# Patient Record
Sex: Female | Born: 1960
Health system: Southern US, Community
[De-identification: ages and names within clinical notes are randomized; demographics above are authoritative.]

## PROBLEM LIST (undated history)

## (undated) DIAGNOSIS — T7840XA Allergy, unspecified, initial encounter: Secondary | ICD-10-CM

## (undated) DIAGNOSIS — D649 Anemia, unspecified: Secondary | ICD-10-CM

## (undated) DIAGNOSIS — F329 Major depressive disorder, single episode, unspecified: Secondary | ICD-10-CM

## (undated) DIAGNOSIS — G4730 Sleep apnea, unspecified: Secondary | ICD-10-CM

## (undated) DIAGNOSIS — G243 Spasmodic torticollis: Secondary | ICD-10-CM

## (undated) DIAGNOSIS — J45909 Unspecified asthma, uncomplicated: Secondary | ICD-10-CM

## (undated) DIAGNOSIS — F32A Depression, unspecified: Secondary | ICD-10-CM

## (undated) DIAGNOSIS — J479 Bronchiectasis, uncomplicated: Secondary | ICD-10-CM

## (undated) HISTORY — PX: EYE SURGERY: SHX253

## (undated) HISTORY — PX: GASTRIC BYPASS: SHX52

## (undated) HISTORY — PX: BREAST CYST ASPIRATION: SHX578

## (undated) HISTORY — DX: Unspecified asthma, uncomplicated: J45.909

## (undated) HISTORY — DX: Bronchiectasis, uncomplicated: J47.9

## (undated) HISTORY — DX: Allergy, unspecified, initial encounter: T78.40XA

## (undated) HISTORY — PX: OTHER SURGICAL HISTORY: SHX169

## (undated) HISTORY — DX: Sleep apnea, unspecified: G47.30

## (undated) HISTORY — PX: LAPAROSCOPIC OOPHERECTOMY: SHX6507

## (undated) HISTORY — DX: Spasmodic torticollis: G24.3

## (undated) HISTORY — DX: Anemia, unspecified: D64.9

---
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
## 2010-10-09 DIAGNOSIS — G259 Extrapyramidal and movement disorder, unspecified: Secondary | ICD-10-CM | POA: Insufficient documentation

## 2011-08-20 DIAGNOSIS — J45909 Unspecified asthma, uncomplicated: Secondary | ICD-10-CM | POA: Insufficient documentation

## 2012-06-30 DIAGNOSIS — F3342 Major depressive disorder, recurrent, in full remission: Secondary | ICD-10-CM | POA: Insufficient documentation

## 2012-06-30 DIAGNOSIS — Z9884 Bariatric surgery status: Secondary | ICD-10-CM | POA: Insufficient documentation

## 2013-10-13 DIAGNOSIS — R911 Solitary pulmonary nodule: Secondary | ICD-10-CM | POA: Insufficient documentation

## 2015-12-26 DIAGNOSIS — R918 Other nonspecific abnormal finding of lung field: Secondary | ICD-10-CM | POA: Insufficient documentation

## 2018-03-12 DIAGNOSIS — H04123 Dry eye syndrome of bilateral lacrimal glands: Secondary | ICD-10-CM | POA: Insufficient documentation

## 2019-10-27 DIAGNOSIS — U071 COVID-19: Secondary | ICD-10-CM | POA: Insufficient documentation

## 2020-05-31 ENCOUNTER — Encounter: Payer: Self-pay | Admitting: Otolaryngology

## 2020-06-05 ENCOUNTER — Other Ambulatory Visit: Payer: Self-pay

## 2020-06-05 ENCOUNTER — Other Ambulatory Visit
Admission: RE | Admit: 2020-06-05 | Discharge: 2020-06-05 | Disposition: A | Discharge status: Home / Self Care | Payer: BC Managed Care – PPO | Source: Ambulatory Visit | Attending: Otolaryngology | Admitting: Otolaryngology

## 2020-06-05 DIAGNOSIS — Z20822 Contact with and (suspected) exposure to covid-19: Secondary | ICD-10-CM | POA: Diagnosis not present

## 2020-06-05 DIAGNOSIS — Z01812 Encounter for preprocedural laboratory examination: Secondary | ICD-10-CM | POA: Insufficient documentation

## 2020-06-05 LAB — SARS CORONAVIRUS 2 (TAT 6-24 HRS): SARS Coronavirus 2: NEGATIVE

## 2020-06-07 ENCOUNTER — Ambulatory Visit
Admission: RE | Admit: 2020-06-07 | Discharge: 2020-06-07 | Disposition: A | Discharge status: Home / Self Care | Payer: BC Managed Care – PPO | Attending: Otolaryngology | Admitting: Otolaryngology

## 2020-06-07 ENCOUNTER — Encounter
Admission: RE | Disposition: A | Discharge status: Home / Self Care | Payer: Self-pay | Source: Home / Self Care | Attending: Otolaryngology

## 2020-06-07 ENCOUNTER — Ambulatory Visit: Payer: BC Managed Care – PPO | Admitting: Anesthesiology

## 2020-06-07 ENCOUNTER — Encounter: Payer: Self-pay | Admitting: Otolaryngology

## 2020-06-07 ENCOUNTER — Other Ambulatory Visit: Payer: Self-pay

## 2020-06-07 DIAGNOSIS — Z79899 Other long term (current) drug therapy: Secondary | ICD-10-CM | POA: Insufficient documentation

## 2020-06-07 DIAGNOSIS — J343 Hypertrophy of nasal turbinates: Secondary | ICD-10-CM | POA: Insufficient documentation

## 2020-06-07 DIAGNOSIS — Z7951 Long term (current) use of inhaled steroids: Secondary | ICD-10-CM | POA: Diagnosis not present

## 2020-06-07 DIAGNOSIS — J342 Deviated nasal septum: Secondary | ICD-10-CM | POA: Insufficient documentation

## 2020-06-07 DIAGNOSIS — Z880 Allergy status to penicillin: Secondary | ICD-10-CM | POA: Insufficient documentation

## 2020-06-07 HISTORY — DX: Depression, unspecified: F32.A

## 2020-06-07 HISTORY — PX: NASAL SEPTOPLASTY W/ TURBINOPLASTY: SHX2070

## 2020-06-07 HISTORY — PX: ENDOSCOPIC CONCHA BULLOSA RESECTION: SHX6395

## 2020-06-07 SURGERY — SEPTOPLASTY, NOSE, WITH NASAL TURBINATE REDUCTION
Anesthesia: General | Site: Nose | Laterality: Right

## 2020-06-07 MED ORDER — ONDANSETRON HCL 4 MG/2ML IJ SOLN
INTRAMUSCULAR | Status: DC | PRN
  Administered 2020-06-07: 4 mg via INTRAVENOUS

## 2020-06-07 MED ORDER — PHENYLEPHRINE HCL (PRESSORS) 10 MG/ML IV SOLN
INTRAVENOUS | Status: DC | PRN
Start: 2020-06-07 — End: 2020-06-07
  Administered 2020-06-07 (×2): 100 ug via INTRAVENOUS

## 2020-06-07 MED ORDER — OXYCODONE HCL 5 MG/5ML PO SOLN: 5.0000 mg | Freq: Once | ORAL | Status: AC | PRN

## 2020-06-07 MED ORDER — GLYCOPYRROLATE 0.2 MG/ML IJ SOLN
INTRAMUSCULAR | Status: DC | PRN
  Administered 2020-06-07: .1 mg via INTRAVENOUS

## 2020-06-07 MED ORDER — LIDOCAINE-EPINEPHRINE 1 %-1:100000 IJ SOLN
INTRAMUSCULAR | Status: DC | PRN
  Administered 2020-06-07: 1 mL
  Administered 2020-06-07: 6 mL

## 2020-06-07 MED ORDER — EPHEDRINE SULFATE 50 MG/ML IJ SOLN
INTRAMUSCULAR | Status: DC | PRN
Start: 2020-06-07 — End: 2020-06-07
  Administered 2020-06-07: 10 mg via INTRAVENOUS
  Administered 2020-06-07 (×2): 5 mg via INTRAVENOUS

## 2020-06-07 MED ORDER — SCOPOLAMINE 1 MG/3DAYS TD PT72
1.0000 | MEDICATED_PATCH | TRANSDERMAL | Status: DC
  Administered 2020-06-07: 1.5 mg via TRANSDERMAL

## 2020-06-07 MED ORDER — OXYMETAZOLINE HCL 0.05 % NA SOLN
2.0000 | Freq: Once | NASAL | Status: AC
  Administered 2020-06-07: 2 via NASAL

## 2020-06-07 MED ORDER — FENTANYL CITRATE (PF) 100 MCG/2ML IJ SOLN
25.0000 ug | INTRAMUSCULAR | Status: DC | PRN
  Administered 2020-06-07 (×2): 25 ug via INTRAVENOUS

## 2020-06-07 MED ORDER — MIDAZOLAM HCL 2 MG/2ML IJ SOLN
1.0000 mg | INTRAMUSCULAR | Status: AC | PRN
  Administered 2020-06-07 (×2): 1 mg via INTRAVENOUS

## 2020-06-07 MED ORDER — MIDAZOLAM HCL 5 MG/5ML IJ SOLN
INTRAMUSCULAR | Status: DC | PRN
  Administered 2020-06-07: 2 mg via INTRAVENOUS

## 2020-06-07 MED ORDER — PROPOFOL 10 MG/ML IV BOLUS
INTRAVENOUS | Status: DC | PRN
  Administered 2020-06-07: 100 mg via INTRAVENOUS

## 2020-06-07 MED ORDER — ONDANSETRON HCL 4 MG/2ML IJ SOLN: 4.0000 mg | Freq: Once | INTRAMUSCULAR | Status: DC | PRN

## 2020-06-07 MED ORDER — SODIUM CHLORIDE 0.9 % IV SOLN
600.0000 mg | Freq: Once | INTRAVENOUS | Status: AC
  Administered 2020-06-07: 600 mg via INTRAVENOUS

## 2020-06-07 MED ORDER — DEXAMETHASONE SODIUM PHOSPHATE 4 MG/ML IJ SOLN
INTRAMUSCULAR | Status: DC | PRN
  Administered 2020-06-07: 10 mg via INTRAVENOUS

## 2020-06-07 MED ORDER — KETOROLAC TROMETHAMINE 30 MG/ML IJ SOLN: 15.0000 mg | Freq: Once | INTRAMUSCULAR | Status: DC | PRN

## 2020-06-07 MED ORDER — PHENYLEPHRINE HCL 0.5 % NA SOLN
NASAL | Status: DC | PRN
  Administered 2020-06-07: 30 mL via TOPICAL

## 2020-06-07 MED ORDER — OXYCODONE HCL 5 MG PO TABS
5.0000 mg | ORAL_TABLET | Freq: Once | ORAL | Status: AC | PRN
  Administered 2020-06-07: 5 mg via ORAL

## 2020-06-07 MED ORDER — LIDOCAINE HCL (CARDIAC) PF 100 MG/5ML IV SOSY
PREFILLED_SYRINGE | INTRAVENOUS | Status: DC | PRN
  Administered 2020-06-07: 50 mg via INTRAVENOUS

## 2020-06-07 MED ORDER — SUCCINYLCHOLINE CHLORIDE 20 MG/ML IJ SOLN
INTRAMUSCULAR | Status: DC | PRN
  Administered 2020-06-07: 100 mg via INTRAVENOUS

## 2020-06-07 MED ORDER — FENTANYL CITRATE (PF) 100 MCG/2ML IJ SOLN
INTRAMUSCULAR | Status: DC | PRN
  Administered 2020-06-07: 100 ug via INTRAVENOUS

## 2020-06-07 MED ORDER — LACTATED RINGERS IV SOLN: INTRAVENOUS | Status: DC

## 2020-06-07 SURGICAL SUPPLY — 33 items
CANISTER SUCT 1200ML W/VALVE (MISCELLANEOUS) ×3 IMPLANT
CATH IV 18X1 1/4 SAFELET (CATHETERS) ×3 IMPLANT
COAGULATOR SUCT 8FR VV (MISCELLANEOUS) ×3 IMPLANT
DRAPE HEAD BAR (DRAPES) ×3 IMPLANT
ELECT REM PT RETURN 9FT ADLT (ELECTROSURGICAL) ×3
ELECTRODE REM PT RTRN 9FT ADLT (ELECTROSURGICAL) ×2 IMPLANT
GEL ULTRASOUND 20GR AQUASONIC (MISCELLANEOUS) ×3 IMPLANT
GLOVE PI ULTRA LF STRL 7.5 (GLOVE) ×6 IMPLANT
GLOVE PI ULTRA NON LATEX 7.5 (GLOVE) ×3
GOWN STRL REUS W/ TWL LRG LVL3 (GOWN DISPOSABLE) ×2 IMPLANT
GOWN STRL REUS W/TWL LRG LVL3 (GOWN DISPOSABLE) ×3
IV CATH 18X1 1/4 SAFELET (CATHETERS) ×2
KIT TURNOVER KIT A (KITS) ×3 IMPLANT
NEEDLE ANESTHESIA  27G X 3.5 (NEEDLE) ×1
NEEDLE ANESTHESIA 27G X 3.5 (NEEDLE) ×2 IMPLANT
NEEDLE HYPO 27GX1-1/4 (NEEDLE) ×3 IMPLANT
PACK ENT CUSTOM (PACKS) ×3 IMPLANT
PACKING NASAL EPIS 4X2.4 XEROG (MISCELLANEOUS) ×3 IMPLANT
PATTIES SURGICAL .5 X3 (DISPOSABLE) ×3 IMPLANT
SOL ANTI-FOG 6CC FOG-OUT (MISCELLANEOUS) ×2 IMPLANT
SOL FOG-OUT ANTI-FOG 6CC (MISCELLANEOUS) ×1
SPLINT NASAL SEPTAL BLV .50 ST (MISCELLANEOUS) ×3 IMPLANT
STRAP BODY AND KNEE 60X3 (MISCELLANEOUS) ×3 IMPLANT
STYLUS VIVAER (MISCELLANEOUS) ×3
STYLUS VIVAER BP ELECT (MISCELLANEOUS) ×2 IMPLANT
SUT CHROMIC 3-0 (SUTURE) ×6
SUT CHROMIC 3-0 KS 27XMFL CR (SUTURE) ×4
SUT ETHILON 3-0 KS 30 BLK (SUTURE) ×3 IMPLANT
SUT PLAIN GUT 4-0 (SUTURE) ×6 IMPLANT
SUTURE CHRMC 3-0 KS 27XMFL CR (SUTURE) ×4 IMPLANT
SYR 3ML LL SCALE MARK (SYRINGE) ×3 IMPLANT
TOWEL OR 17X26 4PK STRL BLUE (TOWEL DISPOSABLE) ×3 IMPLANT
WATER STERILE IRR 250ML POUR (IV SOLUTION) ×3 IMPLANT

## 2020-06-07 NOTE — Progress Notes (Signed)
Pt complaining of anxiety and "difficulty breathing." VSS, lung sounds clear. Pt requesting prescription for anxiety meds at home, Dr. Elenore Rota informed and scripts being sent electronically to pharmacy. Dr. Jena Gauss, anesthesia, informed of pt's complaint and order received for versed. Will admin as ordered and continue to monitor.

## 2020-06-07 NOTE — H&P (Signed)
H&P has been reviewed and patient reevaluated, no changes necessary. To be downloaded later.  

## 2020-06-07 NOTE — Anesthesia Procedure Notes (Signed)
Procedure Name: Intubation Date/Time: 06/07/2020 11:36 AM Performed by: Jimmy Picket, CRNA Pre-anesthesia Checklist: Patient identified, Emergency Drugs available, Suction available, Patient being monitored and Timeout performed Patient Re-evaluated:Patient Re-evaluated prior to induction Oxygen Delivery Method: Circle system utilized Preoxygenation: Pre-oxygenation with 100% oxygen Induction Type: IV induction Ventilation: Mask ventilation without difficulty Laryngoscope Size: Miller and 2 Grade View: Grade I Tube type: Oral Rae Tube size: 7.0 mm Number of attempts: 1 Placement Confirmation: ETT inserted through vocal cords under direct vision,  positive ETCO2 and breath sounds checked- equal and bilateral Tube secured with: Tape Dental Injury: Teeth and Oropharynx as per pre-operative assessment

## 2020-06-07 NOTE — Transfer of Care (Signed)
Immediate Anesthesia Transfer of Care Note  Patient: Dalayza Zambrana  Procedure(s) Performed: NASAL SEPTOPLASTY WITH INFERIOR TURBINATE REDUCTION (Bilateral Nose) ENDOSCOPIC CONCHA BULLOSA RESECTION (Right Nose) NASAL VALVE REPAIR WITH NASAL IMPLANT (Bilateral Nose)  Patient Location: PACU  Anesthesia Type: General  Level of Consciousness: awake, alert  and patient cooperative  Airway and Oxygen Therapy: Patient Spontanous Breathing and Patient connected to supplemental oxygen  Post-op Assessment: Post-op Vital signs reviewed, Patient's Cardiovascular Status Stable, Respiratory Function Stable, Patent Airway and No signs of Nausea or vomiting  Post-op Vital Signs: Reviewed and stable  Complications: No complications documented.

## 2020-06-07 NOTE — Anesthesia Postprocedure Evaluation (Signed)
Anesthesia Post Note  Patient: Jennifer Hubbard  Procedure(s) Performed: NASAL SEPTOPLASTY WITH INFERIOR TURBINATE REDUCTION (Bilateral Nose) ENDOSCOPIC CONCHA BULLOSA RESECTION (Right Nose) NASAL VALVE REPAIR WITH NASAL IMPLANT (Bilateral Nose)     Patient location during evaluation: PACU Anesthesia Type: General Level of consciousness: awake and alert and oriented Pain management: pain level controlled Vital Signs Assessment: post-procedure vital signs reviewed and stable Respiratory status: spontaneous breathing, nonlabored ventilation and respiratory function stable Cardiovascular status: stable and blood pressure returned to baseline Postop Assessment: no headache and no backache Anesthetic complications: no   No complications documented.  Edwyna Ready

## 2020-06-07 NOTE — Discharge Instructions (Signed)
[facility] ENDOSCOPIC SINUS SURGERY [facility]  What is Functional Endoscopic Sinus Surgery?  The Surgery involves making the natural openings of the sinuses larger by removing the bony partitions that separate the sinuses from the nasal cavity.  The natural sinus lining is preserved as much as possible to allow the sinuses to resume normal function after the surgery.  In some patients nasal polyps (excessively swollen lining of the sinuses) may be removed to relieve obstruction of the sinus openings.  The surgery is performed through the nose using lighted scopes, which eliminates the need for incisions on the face.  A septoplasty is a different procedure which is sometimes performed with sinus surgery.  It involves straightening the boy partition that separates the two sides of your nose.  A crooked or deviated septum may need repair if is obstructing the sinuses or nasal airflow.  Turbinate reduction is also often performed during sinus surgery.  The turbinates are bony proturberances from the side walls of the nose which swell and can obstruct the nose in patients with sinus and allergy problems.  Their size can be surgically reduced to help relieve nasal obstruction.  What Can Sinus Surgery Do For Me?  Sinus surgery can reduce the frequency of sinus infections requiring antibiotic treatment.  This can provide improvement in nasal congestion, post-nasal drainage, facial pressure and nasal obstruction.  Surgery will NOT prevent you from ever having an infection again, so it usually only for patients who get infections 4 or more times yearly requiring antibiotics, or for infections that do not clear with antibiotics.  It will not cure nasal allergies, so patients with allergies may still require medication to treat their allergies after surgery. Surgery may improve headaches related to sinusitis, however, some people will continue to  require medication to control sinus headaches related to allergies.  Surgery will do nothing for other forms of headache (migraine, tension or cluster).  What Are the Risks of Endoscopic Sinus Surgery?  Current techniques allow surgery to be performed safely with little risk, however, there are rare complications that patients should be aware of.  Because the sinuses are located around the eyes, there is risk of eye injury, including blindness, though again, this would be quite rare. This is usually a result of bleeding behind the eye during surgery, which puts the vision oat risk, though there are treatments to protect the vision and prevent permanent disrupted by surgery causing a leak of the spinal fluid that surrounds the brain.  More serious complications would include bleeding inside the brain cavity or damage to the brain.  Again, all of these complications are uncommon, and spinal fluid leaks can be safely managed surgically if they occur.  The most common complication of sinus surgery is bleeding from the nose, which may require packing or cauterization of the nose.  Continued sinus have polyps may experience recurrence of the polyps requiring revision surgery.  Alterations of sense of smell or injury to the tear ducts are also rare complications.   What is the Surgery Like, and what is the Recovery?  The Surgery usually takes a couple of hours to perform, and is usually performed under a general anesthetic (completely asleep).  Patients are usually discharged home after a couple of hours.  Sometimes during surgery it is necessary to pack the nose to control bleeding, and the packing is left in place for 24 - 48 hours, and removed by your surgeon.    If a septoplasty was performed during the procedure, there is often a splint placed which must be removed after 5-7 days.   Discomfort: Pain is usually mild to moderate, and can be controlled by prescription pain medication or acetaminophen (Tylenol).   Aspirin, Ibuprofen (Advil, Motrin), or Naprosyn (Aleve) should be avoided, as they can cause increased bleeding.  Most patients feel sinus pressure like they have a bad head cold for several days.  Sleeping with your head elevated can help reduce swelling and facial pressure, as can ice packs over the face.  A humidifier may be helpful to keep the mucous and blood from drying in the nose.   Diet: There are no specific diet restrictions, however, you should generally start with clear liquids and a light diet of bland foods because the anesthetic can cause some nausea.  Advance your diet depending on how your stomach feels.  Taking your pain medication with food will often help reduce stomach upset which pain medications can cause.  Nasal Saline Irrigation: It is important to remove blood clots and dried mucous from the nose as it is healing.  This is done by having you irrigate the nose at least 3 - 4 times daily with a salt water solution.  We recommend using NeilMed Sinus Rinse (available at the drug store).  Fill the squeeze bottle with the solution, bend over a sink, and insert the tip of the squeeze bottle into the nose  of an inch.  Point the tip of the squeeze bottle towards the inside corner of the eye on the same side your irrigating.  Squeeze the bottle and gently irrigate the nose.  If you bend forward as you do this, most of the fluid will flow back out of the nose, instead of down your throat.   The solution should be warm, near body temperature, when you irrigate.   Each time you irrigate, you should use a full squeeze bottle.   Note that if you are instructed to use Nasal Steroid Sprays at any time after your surgery, irrigate with saline BEFORE using the steroid spray, so you do not wash it all out of the nose. Another product, Nasal Saline Gel (such as AYR Nasal Saline Gel) can be applied in each nostril 3 - 4 times daily to moisture the nose and reduce scabbing or crusting.  Bleeding:   Bloody drainage from the nose can be expected for several days, and patients are instructed to irrigate their nose frequently with salt water to help remove mucous and blood clots.  The drainage may be dark red or brown, though some fresh blood may be seen intermittently, especially after irrigation.  Do not blow you nose, as bleeding may occur. If you must sneeze, keep your mouth open to allow air to escape through your mouth.  If heavy bleeding occurs: Irrigate the nose with saline to rinse out clots, then spray the nose 3 - 4 times with Afrin Nasal Decongestant Spray.  The spray will constrict the blood vessels to slow bleeding.  Pinch the lower half of your nose shut to apply pressure, and lay down with your head elevated.  Ice packs over the nose may help as well. If bleeding persists despite these measures, you should notify your doctor.  Do not use the Afrin routinely to control nasal congestion after surgery, as it can result in worsening congestion and may affect healing.   Activity: Return to work varies among patients. Most patients will be out of   work at least 5 - 7 days to recover.  Patient may return to work after they are off of narcotic pain medication, and feeling well enough to perform the functions of their job.  Patients must avoid heavy lifting (over 10 pounds) or strenuous physical for 2 weeks after surgery, so your employer may need to assign you to light duty, or keep you out of work longer if light duty is not possible.  NOTE: you should not drive, operate dangerous machinery, do any mentally demanding tasks or make any important legal or financial decisions while on narcotic pain medication and recovering from the general anesthetic.    Call Your Doctor Immediately if You Have Any of the Following: 1. Bleeding that you cannot control with the above measures 2. Loss of vision, double vision, bulging of the eye or black eyes. 3. Fever over 101 degrees 4. Neck stiffness with severe  headache, fever, nausea and change in mental state. You are always encourage to call anytime with concerns, however, please call with requests for pain medication refills during office hours.  Office Endoscopy: During follow-up visits your doctor will remove any packing or splints that may have been placed and evaluate and clean your sinuses endoscopically.  Topical anesthetic will be used to make this as comfortable as possible, though you may want to take your pain medication prior to the visit.  How often this will need to be done varies from patient to patient.  After complete recovery from the surgery, you may need follow-up endoscopy from time to time, particularly if there is concern of recurrent infection or nasal polyps.  General Anesthesia, Adult, Care After This sheet gives you information about how to care for yourself after your procedure. Your health care provider may also give you more specific instructions. If you have problems or questions, contact your health care provider. What can I expect after the procedure? After the procedure, the following side effects are common:  Pain or discomfort at the IV site.  Nausea.  Vomiting.  Sore throat.  Trouble concentrating.  Feeling cold or chills.  Weak or tired.  Sleepiness and fatigue.  Soreness and body aches. These side effects can affect parts of the body that were not involved in surgery. Follow these instructions at home:  For at least 24 hours after the procedure:  Have a responsible adult stay with you. It is important to have someone help care for you until you are awake and alert.  Rest as needed.  Do not: ? Participate in activities in which you could fall or become injured. ? Drive. ? Use heavy machinery. ? Drink alcohol. ? Take sleeping pills or medicines that cause drowsiness. ? Make important decisions or sign legal documents. ? Take care of children on your own. Eating and drinking  Follow any  instructions from your health care provider about eating or drinking restrictions.  When you feel hungry, start by eating small amounts of foods that are soft and easy to digest (bland), such as toast. Gradually return to your regular diet.  Drink enough fluid to keep your urine pale yellow.  If you vomit, rehydrate by drinking water, juice, or clear broth. General instructions  If you have sleep apnea, surgery and certain medicines can increase your risk for breathing problems. Follow instructions from your health care provider about wearing your sleep device: ? Anytime you are sleeping, including during daytime naps. ? While taking prescription pain medicines, sleeping medicines, or medicines that make you   drowsy.  Return to your normal activities as told by your health care provider. Ask your health care provider what activities are safe for you.  Take over-the-counter and prescription medicines only as told by your health care provider.  If you smoke, do not smoke without supervision.  Keep all follow-up visits as told by your health care provider. This is important. Contact a health care provider if:  You have nausea or vomiting that does not get better with medicine.  You cannot eat or drink without vomiting.  You have pain that does not get better with medicine.  You are unable to pass urine.  You develop a skin rash.  You have a fever.  You have redness around your IV site that gets worse. Get help right away if:  You have difficulty breathing.  You have chest pain.  You have blood in your urine or stool, or you vomit blood. Summary  After the procedure, it is common to have a sore throat or nausea. It is also common to feel tired.  Have a responsible adult stay with you for the first 24 hours after general anesthesia. It is important to have someone help care for you until you are awake and alert.  When you feel hungry, start by eating small amounts of foods  that are soft and easy to digest (bland), such as toast. Gradually return to your regular diet.  Drink enough fluid to keep your urine pale yellow.  Return to your normal activities as told by your health care provider. Ask your health care provider what activities are safe for you. This information is not intended to replace advice given to you by your health care provider. Make sure you discuss any questions you have with your health care provider. Document Revised: 10/16/2017 Document Reviewed: 05/29/2017 Elsevier Patient Education  2020 [employer]  Scopolamine skin patches Remove in 72 hrs. Wash hands immediately after removal. What is this medicine? SCOPOLAMINE (skoe POL a meen) is used to prevent nausea and vomiting caused by motion sickness, anesthesia and surgery. This medicine may be used for other purposes; ask your health care provider or pharmacist if you have questions. COMMON BRAND NAME(S): Transderm Scop What should I tell my health care provider before I take this medicine? They need to know if you have any of these conditions:  are scheduled to have a gastric secretion test  glaucoma  heart disease  kidney disease  liver disease  lung or breathing disease, like asthma  mental illness  prostate disease  seizures  stomach or intestine problems  trouble passing urine  an unusual or allergic reaction to scopolamine, atropine, other medicines, foods, dyes, or preservatives  pregnant or trying to get pregnant  breast-feeding How should I use this medicine? This medicine is for external use only. Follow the directions on the prescription label. Wear only 1 patch at a time. Choose an area behind the ear, that is clean, dry, hairless and free from any cuts or irritation. Wipe the area with a clean dry tissue. Peel off the plastic backing of the skin patch, trying not to touch the adhesive side with your hands. Do not cut the patches. Firmly apply to the area  you have chosen, with the metallic side of the patch to the skin and the tan-colored side showing. Once firmly in place, wash your hands well with soap and water. Do not get this medicine into your eyes. After removing the patch, wash your hands and the   area behind your ear thoroughly with soap and water. The patch will still contain some medicine after use. To avoid accidental contact or ingestion by children or pets, fold the used patch in half with the sticky side together and throw away in the trash out of the reach of children and pets. If you need to use a second patch after you remove the first, place it behind the other ear. A special MedGuide will be given to you by the pharmacist with each prescription and refill. Be sure to read this information carefully each time. Talk to your pediatrician regarding the use of this medicine in children. Special care may be needed. Overdosage: If you think you have taken too much of this medicine contact a poison control center or emergency room at once. NOTE: This medicine is only for you. Do not share this medicine with others. What if I miss a dose? This does not apply. This medicine is not for regular use. What may interact with this medicine?  alcohol  antihistamines for allergy cough and cold  atropine  certain medicines for anxiety or sleep  certain medicines for bladder problems like oxybutynin, tolterodine  certain medicines for depression like amitriptyline, fluoxetine, sertraline  certain medicines for stomach problems like dicyclomine, hyoscyamine  certain medicines for Parkinson's disease like benztropine, trihexyphenidyl  certain medicines for seizures like phenobarbital, primidone  general anesthetics like halothane, isoflurane, methoxyflurane, propofol  ipratropium  local anesthetics like lidocaine, pramoxine, tetracaine  medicines that relax muscles for surgery  phenothiazines like chlorpromazine, mesoridazine,  prochlorperazine, thioridazine  narcotic medicines for pain  other belladonna alkaloids This list may not describe all possible interactions. Give your health care provider a list of all the medicines, herbs, non-prescription drugs, or dietary supplements you use. Also tell them if you smoke, drink alcohol, or use illegal drugs. Some items may interact with your medicine. What should I watch for while using this medicine? Limit contact with water while swimming and bathing because the patch may fall off. If the patch falls off, throw it away and put a new one behind the other ear. You may get drowsy or dizzy. Do not drive, use machinery, or do anything that needs mental alertness until you know how this medicine affects you. Do not stand or sit up quickly, especially if you are an older patient. This reduces the risk of dizzy or fainting spells. Alcohol may interfere with the effect of this medicine. Avoid alcoholic drinks. Your mouth may get dry. Chewing sugarless gum or sucking hard candy, and drinking plenty of water may help. Contact your healthcare professional if the problem does not go away or is severe. This medicine may cause dry eyes and blurred vision. If you wear contact lenses, you may feel some discomfort. Lubricating drops may help. See your healthcare professional if the problem does not go away or is severe. If you are going to need surgery, an MRI, CT scan, or other procedure, tell your healthcare professional that you are using this medicine. You may need to remove the patch before the procedure. What side effects may I notice from receiving this medicine? Side effects that you should report to your doctor or health care professional as soon as possible:  allergic reactions like skin rash, itching or hives; swelling of the face, lips, or tongue  blurred vision  changes in vision  confusion  dizziness  eye pain  fast, irregular heartbeat  hallucinations, loss of contact  with reality  nausea, vomiting    pain or trouble passing urine  restlessness  seizures  skin irritation  stomach pain Side effects that usually do not require medical attention (report to your doctor or health care professional if they continue or are bothersome):  drowsiness  dry mouth  headache  sore throat This list may not describe all possible side effects. Call your doctor for medical advice about side effects. You may report side effects to FDA at 1-800-FDA-1088. Where should I keep my medicine? Keep out of the reach of children. Store at room temperature between 20 and 25 degrees C (68 and 77 degrees F). Keep this medicine in the foil package until ready to use. Throw away any unused medicine after the expiration date. NOTE: This sheet is a summary. It may not cover all possible information. If you have questions about this medicine, talk to your doctor, pharmacist, or health care provider.  2020 Elsevier/Gold Standard (2018-01-01 16:14:46)   

## 2020-06-07 NOTE — Op Note (Signed)
06/07/2020  12:48 PM  [id number]   Pre-Op Dx:  Deviated Nasal Septum, Hypertrophic Inferior Turbinates, concha bullosa right middle turbinate, nasal valve obstruction  Post-op Dx: Same  Proc: Nasal Septoplasty, Bilateral Partial Reduction Inferior Turbinates, endoscopic trimming right middle turbinate concha bullosa, bilateral destruction of nasal lesions(nasal valve repair using Vivaer)   Surg:  Beverly Sessions Broadus Costilla  Anes:  GOT  EBL:  50 ml  Comp:  None  Findings: Patient with fairly thin cartilage that is flexible and collapsible.  The septum deviates to the left side but has an inferior spur on the right.  There is a vomer spur on the left side posteriorly.  The nasal valves are narrow with lateral collapse of the valve wall.  The inferior turbinates are enlarged and she has a concha bullosa of the right middle turbinate   Procedure: With the patient in a comfortable supine position,  general orotracheal anesthesia was induced without difficulty.     The patient received preoperative Afrin spray for topical decongestion and vasoconstriction.  Intravenous prophylactic antibiotics were administered.  At an appropriate level, the patient was placed in a semi-sitting position.  Nasal vibrissae were trimmed.   1% Xylocaine with 1:100,000 epinephrine, 6 cc's, was infiltrated into the anterior floor of the nose, into the nasal spine region, into the membranous columella, and finally into the submucoperichondrial plane of the septum on both sides.  Several minutes were allowed for this to take effect.  Cottoniod pledgetts soaked in Afrin and 4% Xylocaine were placed into both nasal cavities and left while the patient was prepped and draped in the standard fashion.  The materials were removed from the nose and observed to be intact and correct in number.  The nose was inspected with a headlight and zero degree scope with the findings as described above.  A left Killian incision was sharply executed  and carried down to the quadrangular cartilage. The mucoperichondrium was elelvated along the quadrangular plate back to the bony-cartilaginous junction. The mucoperiostium was then elevated along the ethmoid plate and the vomer. The boney-catilaginous junction was then split with a freer elevator and the mucoperiosteum was elevated on the opposite side. The mucoperiosteum was then elevated along the maxillary crest as needed to expose the crooked bone of the crest.  Boney spurs of the vomer and maxillary crest were removed with Lenoria Chime forceps.  The cartilaginous plate was trimmed along its posterior and inferior borders of about 2 mm of cartilage to free it up inferiorly. Some of the deviated ethmoid plate was then fractured and removed with Takahashi forceps to free up the posterior border of the quadrangular plate and allow it to swing back to the midline. The mucosal flaps were placed back into their anatomic position to allow visualization of the airways. The septum now sat in the midline with an improved airway.  A 3-0 Chromic suture on a Keith needle in used to anchor the inferior septum at the nasal spine with a through and through suture. The mucosal flaps are then sutured together using a through and through whip stitch of 4-0 Plain Gut with a mini-Keith needle. This was used to close the [place] incision as well.   The inferior turbinates were then inspected. An incision was created along the inferior aspect of the left inferior turbinate with removal of some of the inferior soft tissue and bone. Electrocautery was used to control bleeding in the area. The remaining turbinate was then outfractured to open up the airway further.  There was no significant bleeding noted. The right turbinate was then trimmed and outfractured in a similar fashion.  The 0 degree scope was used to visualize the right middle turbinate.  It was bulging into the middle meatus on its lateral wall.  Incision was made here  that opened up into a large concha bullosa.  The lateral wall the concha bullosa was removed leave a J shaped right middle turbinate.  The edges were trimmed using through biting forceps and then some electrocautery was used to help control bleeding.  The middle meatus was then filled with some xerogel help control bleeding and prevent scabbing here.  The nasal valves were visualized and a small amount of 1% Xylocaine with epi 1-100,000 was placed in the lateral wall of the nasal valve on both sides.  The Vivaer system was brought in and hooked up.  The probe was lubricated with gel and placed in the lateral nasal valve and the power was applied.  This was cycled on for 18 seconds and then off for 12 seconds.  This was done in 4 sections on the left side of the nose at the lateral nasal valve wall.  This helped to remodel it and hold it more open.  This was then repeated in the right nostril on the lateral nasal wall.  Begin use 18 seconds of power followed by 12 seconds of cooling down.  This was done in 4 different places along the lateral nasal valve wall.  This helped open up the nasal valve airway bilaterally.  The airways were then visualized and showed open passageways on both sides that were significantly improved compared to before surgery. There was no signifcant bleeding. Nasal splints were applied to both sides of the septum using Xomed 0.96mm regular sized splints that were trimmed, and then held in position with a 3-0 Nylon through and through suture.  The patient was turned back over to anesthesia, and awakened, extubated, and taken to the PACU in satisfactory condition.  Dispo:   PACU to home  Plan: Ice, elevation, narcotic analgesia, steroid taper, and prophylactic antibiotics for the duration of indwelling nasal foreign bodies.  We will reevaluate the patient in the office in 6 days and remove the septal splints.  Return to work in 10 days, strenuous activities in two weeks.   Beverly Sessions  Casie Sturgeon 06/07/2020 12:48 PM

## 2020-06-07 NOTE — Progress Notes (Signed)
Pt reports improvement in anxiety since receiving versed. VSS.

## 2020-06-07 NOTE — Anesthesia Preprocedure Evaluation (Addendum)
Anesthesia Evaluation  Patient identified by MRN, date of birth, ID band Patient awake    Reviewed: Allergy & Precautions, NPO status , Patient's Chart, lab work & pertinent test results  Airway Mallampati: II  TM Distance: >3 FB Neck ROM: Full    Dental no notable dental hx.    Pulmonary neg pulmonary ROS,   Some coarse upper airway sounds and deep coughs during exam/interview, but no impairment of breath sounds. Pulmonary exam normal breath sounds clear to auscultation       Cardiovascular negative cardio ROS Normal cardiovascular exam Rhythm:Regular Rate:Normal     Neuro/Psych PSYCHIATRIC DISORDERS Depression negative neurological ROS     GI/Hepatic negative GI ROS, Neg liver ROS,   Endo/Other  negative endocrine ROS  Renal/GU negative Renal ROS  negative genitourinary   Musculoskeletal negative musculoskeletal ROS (+)   Abdominal Normal abdominal exam  (+) - obese,   Peds negative pediatric ROS (+)  Hematology negative hematology ROS (+)   Anesthesia Other Findings   Reproductive/Obstetrics negative OB ROS                            Anesthesia Physical Anesthesia Plan  ASA: II  Anesthesia Plan: General   Post-op Pain Management:    Induction: Intravenous  PONV Risk Score and Plan: 3 and Treatment may vary due to age or medical condition  Airway Management Planned: Oral ETT  Additional Equipment:   Intra-op Plan:   Post-operative Plan: Extubation in OR  Informed Consent: I have reviewed the patients History and Physical, chart, labs and discussed the procedure including the risks, benefits and alternatives for the proposed anesthesia with the patient or authorized representative who has indicated his/her understanding and acceptance.     Dental advisory given  Plan Discussed with: CRNA and Anesthesiologist  Anesthesia Plan Comments: (Patient has a history of multiple  eye surgeries and bronchiectasis and has significant concerns around the anesthesia. Desires no "goo" in her eyes, but would like saline drops, and "close monitoring of breathing". Risks specific to these concerns were discussed at length. )       Anesthesia Quick Evaluation   There are no problems to display for this patient.  Risks and benefits of anesthesia discussed at length, patient or surrogate demonstrates understanding. Appropriately NPO. Plan to proceed with anesthesia.  Corlis Leak, MD 06/07/20

## 2020-06-11 ENCOUNTER — Encounter: Payer: Self-pay | Admitting: Otolaryngology

## 2020-07-20 ENCOUNTER — Telehealth: Payer: Self-pay

## 2020-07-20 NOTE — Telephone Encounter (Signed)
This is not something that can be determined until her medical records have been reviewed and we meet with her in person for our first visit.

## 2020-07-20 NOTE — Telephone Encounter (Signed)
The pt was notified of the providers recommendation. She verbalize understanding, no question or concern.

## 2020-07-20 NOTE — Telephone Encounter (Signed)
Copied from CRM [id number]. Topic: General - Other >> Jul 20, 2020  8:30 AM Jaquita Rector A wrote: Reason for CRM: Patient called in and made an appointment for a new patient with [facility]. She had some concerns about being able to get her hormone replacement medication written by her provider and would like a call back to let her know if this can be done from this clinic when she become a patient. Patient waiting for a call back from nurse with an answer please. Ph# [phone]

## 2020-08-14 ENCOUNTER — Other Ambulatory Visit: Payer: Self-pay | Admitting: Internal Medicine

## 2020-08-14 DIAGNOSIS — Z1231 Encounter for screening mammogram for malignant neoplasm of breast: Secondary | ICD-10-CM

## 2020-08-29 ENCOUNTER — Inpatient Hospital Stay: Admission: RE | Admit: 2020-08-29 | Payer: BC Managed Care – PPO | Source: Ambulatory Visit

## 2020-08-31 ENCOUNTER — Other Ambulatory Visit: Payer: Self-pay

## 2020-08-31 ENCOUNTER — Ambulatory Visit
Admission: EM | Admit: 2020-08-31 | Discharge: 2020-08-31 | Disposition: A | Discharge status: Home / Self Care | Payer: BC Managed Care – PPO | Attending: Internal Medicine | Admitting: Internal Medicine

## 2020-08-31 DIAGNOSIS — R9431 Abnormal electrocardiogram [ECG] [EKG]: Secondary | ICD-10-CM

## 2020-08-31 DIAGNOSIS — J45901 Unspecified asthma with (acute) exacerbation: Secondary | ICD-10-CM

## 2020-08-31 DIAGNOSIS — J069 Acute upper respiratory infection, unspecified: Secondary | ICD-10-CM

## 2020-08-31 MED ORDER — DOXYCYCLINE HYCLATE 100 MG PO CAPS
100.0000 mg | ORAL_CAPSULE | Freq: Two times a day (BID) | ORAL | 0 refills | Status: DC
Start: 2020-08-31 — End: 2020-09-06

## 2020-08-31 MED ORDER — PREDNISONE 10 MG PO TABS: ORAL_TABLET | ORAL | 0 refills | Status: DC

## 2020-08-31 MED ORDER — BENZONATATE 200 MG PO CAPS
200.0000 mg | ORAL_CAPSULE | Freq: Two times a day (BID) | ORAL | 0 refills | Status: DC | PRN
Start: 2020-08-31 — End: 2020-09-06

## 2020-08-31 NOTE — ED Provider Notes (Signed)
[facility]    CSN: [id number] Arrival date & time: 08/31/20  1223      History   Chief Complaint Chief Complaint  Patient presents with  . Cough    HPI Jennifer Hubbard is a 59 y.o. female who presents with cough and chest and throat tightness which she gets with her asthma flair since infant and  that started 3 days ago. Started with rhinitis, post nasal drainage and then cough. She could not get in to see her pulmonologist and was told by them to come here today.  Her pulmonologist placed her on Prednisone and tessalon when she gets this way which helps.  She feels tightness in her throat and central chest has improved with the Albuterol inhaler. She denies hx of cardiac event, but has never seen a cardiologist. She has had several hypoxemic episodes  And near death experiences from her asthma and bronchiectasis since infant. She has new insurance and has to change PCP's and will establish with a new one next week.     Past Medical History:  Diagnosis Date  . Depression     There are no problems to display for this patient.   Past Surgical History:  Procedure Laterality Date  . ENDOSCOPIC CONCHA BULLOSA RESECTION Right 06/07/2020   Procedure: ENDOSCOPIC CONCHA BULLOSA RESECTION;  Surgeon: Vernie Murders, MD;  Location: [facility];  Service: ENT;  Laterality: Right;  . EYE SURGERY    . GASTRIC BYPASS    . LAPAROSCOPIC OOPHERECTOMY    . NASAL SEPTOPLASTY W/ TURBINOPLASTY Bilateral 06/07/2020   Procedure: NASAL SEPTOPLASTY WITH INFERIOR TURBINATE REDUCTION;  Surgeon: Vernie Murders, MD;  Location: [facility];  Service: ENT;  Laterality: Bilateral;    OB History   No obstetric history on file.      Home Medications    Prior to Admission medications   Medication Sig Start Date End Date Taking? Authorizing Provider  albuterol (VENTOLIN HFA) 108 (90 Base) MCG/ACT inhaler Inhale into the lungs every 6 (six) hours as needed for wheezing or  shortness of breath.    [provider]  budesonide-formoterol (SYMBICORT) 160-4.5 MCG/ACT inhaler Inhale 2 puffs into the lungs 2 (two) times daily.    [provider]  Cholecalciferol (VITAMIN D3) 1.25 MG (50000 UT) CAPS Take by mouth.    [provider]  DULoxetine (CYMBALTA) 60 MG capsule Take 60 mg by mouth daily.    [provider]  estradiol (ESTRACE) 1 MG tablet Take 1 mg by mouth daily.    [provider]  fluticasone (VERAMYST) 27.5 MCG/SPRAY nasal spray Place 2 sprays into the nose daily.    [provider]  montelukast (SINGULAIR) 10 MG tablet Take 10 mg by mouth at bedtime.    [provider]  Multiple Vitamin (MULTIVITAMIN) capsule Take 1 capsule by mouth daily.    [provider]  NON FORMULARY     [provider]  Omega-3 Fatty Acids (FISH OIL) 1000 MG CAPS Take by mouth.    [provider]    Family History History reviewed. No pertinent family history.  Social History Social History   Tobacco Use  . Smoking status: Never Smoker  . Smokeless tobacco: Never Used  Substance Use Topics  . Alcohol use: Not Currently  . Drug use: Not on file     Allergies   Erythromycin and Penicillins   Review of Systems Review of Systems  Constitutional: Negative for activity change, appetite change, chills, diaphoresis,  fatigue and fever.  HENT: Positive for postnasal drip and rhinorrhea. Negative for congestion, ear discharge, ear pain and sore throat.   Eyes: Negative for discharge.  Respiratory: Positive for cough and chest tightness. Negative for choking, shortness of breath and wheezing.   Cardiovascular: Positive for palpitations. Negative for chest pain.       Occasionally  Musculoskeletal: Negative for gait problem and myalgias.  Skin: Negative for rash.  Allergic/Immunologic: Positive for environmental allergies.  Neurological: Negative for dizziness and headaches.    Hematological: Negative for adenopathy.     Physical Exam Triage Vital Signs ED Triage Vitals  Enc Vitals Group     BP 08/31/20 1304 (!) 144/81     Pulse Rate 08/31/20 1304 (!) 103     Resp 08/31/20 1304 19     Temp 08/31/20 1304 98.2 F (36.8 C)     Temp Source 08/31/20 1304 Oral     SpO2 08/31/20 1304 100 %     Weight --      Height --      Head Circumference --      Peak Flow --      Pain Score 08/31/20 1303 0     Pain Loc --      Pain Edu? --      Excl. in GC? --    No data found.  Updated Vital Signs BP (!) 144/81 (BP Location: Right Arm)   Pulse (!) 103   Temp 98.2 F (36.8 C) (Oral)   Resp 19   SpO2 100%   Visual Acuity Right Eye Distance:   Left Eye Distance:   Bilateral Distance:    Right Eye Near:   Left Eye Near:    Bilateral Near:     Physical Exam  Physical Exam Vitals signs and nursing note reviewed.  Constitutional:      General: She is not in acute distress.    Appearance: Normal appearance. She is not ill-appearing, toxic-appearing or diaphoretic.  HENT:     Head: Normocephalic.     Right Ear: Tympanic membrane, ear canal and external ear normal.     Left Ear: Tympanic membrane, ear canal and external ear normal.     Nose: Nose normal.     Mouth/Throat:     Mouth: Mucous membranes are moist.  Eyes:     General: No scleral icterus.       Right eye: No discharge.        Left eye: No discharge.     Conjunctiva/sclera: Conjunctivae normal.  Neck:     Musculoskeletal: Neck supple. No neck rigidity.  Cardiovascular:     Rate and Rhythm: Normal rate and regular rhythm.     Heart sounds: No murmur.  Pulmonary:     Effort: Pulmonary effort is normal.     Breath sounds: Normal breath sounds  Musculoskeletal: Normal range of motion.  Lymphadenopathy:     Cervical: No cervical adenopathy.  Skin:    General: Skin is warm and dry.     Coloration: Skin is not jaundiced.     Findings: No rash.  Neurological:     Mental Status: She is  alert and oriented to person, place, and time.     Gait: Gait normal.  Psychiatric:        Mood and Affect: Mood normal.        Behavior: Behavior normal.        Thought Content: Thought content normal.  Judgment: Judgment normal.    UC Treatments / Results  Labs (all labs ordered are listed, but only abnormal results are displayed) Labs Reviewed - No data to display  EKG Sinus tach, cant r/o anterior infarct of age undetermined.  Abnormal EKG  Radiology No results found.  Procedures Procedures (including critical care time)  Medications Ordered in UC Medications - No data to display  Initial Impression / Assessment and Plan / UC Course  I have reviewed the triage vital signs and the nursing notes. Seems to be having URI with mild asthma exacerbation. I placed her on the combo meds that work for her like prednisone taper and tessalon. I handed her a copy of her EKG and told her to take it to her PCP next week for further work up.     Final Clinical Impressions(s) / UC Diagnoses   Final diagnoses:  None   Discharge Instructions   None    ED Prescriptions    None     PDMP not reviewed this encounter.   Garey Ham, PA-C 08/31/20 1442

## 2020-08-31 NOTE — ED Triage Notes (Signed)
Pt is here with a cough/chest pain that started Tuesday, pt has taken old meds from her previous visits. Pt is requesting Prednisone, Tessalon pearls to relieve discomfort.

## 2020-08-31 NOTE — Discharge Instructions (Addendum)
Show your EKG to your new PCP next week for further evaluation since you dont seem to have cardiac pain right now.

## 2020-09-06 ENCOUNTER — Encounter: Payer: Self-pay | Admitting: Family Medicine

## 2020-09-06 ENCOUNTER — Other Ambulatory Visit: Payer: Self-pay

## 2020-09-06 ENCOUNTER — Ambulatory Visit (INDEPENDENT_AMBULATORY_CARE_PROVIDER_SITE_OTHER): Payer: BC Managed Care – PPO | Admitting: Family Medicine

## 2020-09-06 VITALS — BP 154/92 | HR 75 | Temp 98.1°F | Ht 66.0 in | Wt 151.0 lb

## 2020-09-06 DIAGNOSIS — Z1211 Encounter for screening for malignant neoplasm of colon: Secondary | ICD-10-CM | POA: Diagnosis not present

## 2020-09-06 DIAGNOSIS — J479 Bronchiectasis, uncomplicated: Secondary | ICD-10-CM | POA: Diagnosis not present

## 2020-09-06 DIAGNOSIS — Z79899 Other long term (current) drug therapy: Secondary | ICD-10-CM | POA: Diagnosis not present

## 2020-09-06 DIAGNOSIS — Z8249 Family history of ischemic heart disease and other diseases of the circulatory system: Secondary | ICD-10-CM

## 2020-09-06 DIAGNOSIS — F32A Depression, unspecified: Secondary | ICD-10-CM | POA: Insufficient documentation

## 2020-09-06 DIAGNOSIS — F419 Anxiety disorder, unspecified: Secondary | ICD-10-CM

## 2020-09-06 DIAGNOSIS — Z7689 Persons encountering health services in other specified circumstances: Secondary | ICD-10-CM

## 2020-09-06 DIAGNOSIS — G243 Spasmodic torticollis: Secondary | ICD-10-CM

## 2020-09-06 LAB — POCT URINALYSIS DIPSTICK
Bilirubin, UA: NEGATIVE
Blood, UA: NEGATIVE
Glucose, UA: NEGATIVE
Ketones, UA: NEGATIVE
Leukocytes, UA: NEGATIVE
Nitrite, UA: NEGATIVE
Protein, UA: NEGATIVE
Spec Grav, UA: 1.01 (ref 1.010–1.025)
Urobilinogen, UA: 0.2 E.U./dL
pH, UA: 6 (ref 5.0–8.0)

## 2020-09-06 MED ORDER — ESTRADIOL 1 MG PO TABS
1.0000 mg | ORAL_TABLET | Freq: Every day | ORAL | 1 refills | Status: DC
Start: 2020-09-06 — End: 2020-09-12

## 2020-09-06 MED ORDER — DULOXETINE HCL 60 MG PO CPEP: 60.0000 mg | ORAL_CAPSULE | Freq: Every day | ORAL | 1 refills | Status: DC

## 2020-09-06 NOTE — Assessment & Plan Note (Signed)
Reports stable and well controlled.  Follows with [facility] for management.

## 2020-09-06 NOTE — Assessment & Plan Note (Signed)
Pt requiring colon cancer screening.  Denies family history of colon cancer and reports 2 screening colonoscopies in the past without abnormalities.  Plan: - Discussed timing for initiation of colon cancer screening ACS vs USPSTF guidelines - Mutual decision making discussion for options of colonoscopy vs cologuard.  Pt prefers cologuard. - Ordered Cologuard today

## 2020-09-06 NOTE — Patient Instructions (Signed)
I have sent in refills on your duloxetine and estradiol to your pharmacy on file.  A referral to Psychiatry has been placed today.  If you have not heard from the specialty office or our referral coordinator within 1 week, please let us know and we will follow up with the referral coordinator for an update.  A referral to Cardiology has been placed today.  If you have not heard from the specialty office or our referral coordinator within 1 week, please let us know and we will follow up with the referral coordinator for an update.  I have sent in an order for a Cologuard.  They will mail this to your home.  There will be an instruction card and contact information for their customer service should you have any questions/concerns with the process  We will plan to see you back in 3-6 months for your physical  You will receive a survey after today's visit either digitally by e-mail or paper by USPS mail. Your experiences and feedback matter to [place].  Please respond so we know how we are doing as we provide care for you.  Call us with any questions/concerns/needs.  It is my goal to be available to you for your health concerns.  Thanks for choosing me to be a partner in your healthcare needs!  Charlaine Dalton, FNP-C Family Nurse Practitioner [facility] Phone: [phone]

## 2020-09-06 NOTE — Progress Notes (Signed)
Subjective:    Patient ID: Jennifer Hubbard, female    DOB: 1961-07-22, 59 y.o.   MRN: [id number]  Jennifer Hubbard is a 59 y.o. female presenting on 09/06/2020 for Establish Care and Referral (wants you to manage cymbalta until she sees pyschiatrist, and wants RF to see pyschiatrist,  to RF cymbalta and cardiologist for irregular EKG)   HPI  [name] presents to clinic as a new patient to establish for primary care.  Previous PCP was with Jennifer Hubbard in [facility].  Records will be requested.  Past medical, family, and surgical history reviewed w/ pt.  She has acute concerns today for referral to cardiology to establish as a patient as well as a referral to psychiatry for new patient establishment.    Depression screen [facility] 2/9 09/06/2020  Decreased Interest 0  Down, Depressed, Hopeless 0  PHQ - 2 Score 0  Altered sleeping 0  Tired, decreased energy 0  Change in appetite 0  Feeling bad or failure about yourself  0  Trouble concentrating 0  Moving slowly or fidgety/restless 0  Suicidal thoughts 0  PHQ-9 Score 0    Social History   Tobacco Use  . Smoking status: Never Smoker  . Smokeless tobacco: Never Used  Vaping Use  . Vaping Use: Never used  Substance Use Topics  . Alcohol use: Not Currently  . Drug use: Never    Review of Systems  Constitutional: Negative.   HENT: Negative.   Eyes: Negative.   Respiratory: Negative.   Cardiovascular: Negative.   Gastrointestinal: Negative.   Endocrine: Negative.   Genitourinary: Negative.   Musculoskeletal: Negative.   Skin: Negative.   Allergic/Immunologic: Negative.   Neurological: Negative.   Hematological: Negative.   Psychiatric/Behavioral: Negative for agitation, behavioral problems, confusion, decreased concentration, dysphoric mood, hallucinations, self-injury, sleep disturbance and suicidal ideas. The patient is nervous/anxious. The patient is not hyperactive.    Per HPI unless specifically indicated above      Objective:    BP (!) 154/92   Pulse 75   Temp 98.1 F (36.7 C) (Oral)   Ht '5\' 6"'  (1.676 m)   Wt 151 lb (68.5 kg)   SpO2 100%   BMI 24.37 kg/m   Wt Readings from Last 3 Encounters:  09/06/20 151 lb (68.5 kg)  06/07/20 155 lb (70.3 kg)    Physical Exam Vitals and nursing note reviewed.  Constitutional:      General: She is not in acute distress.    Appearance: Normal appearance. She is well-developed, well-groomed and normal weight. She is not ill-appearing or toxic-appearing.  HENT:     Head: Normocephalic and atraumatic.     Nose:     Comments: Jennifer Hubbard is in place, covering mouth and nose. Eyes:     General: Lids are normal. Vision grossly intact.        Right eye: No discharge.        Left eye: No discharge.     Extraocular Movements: Extraocular movements intact.     Conjunctiva/sclera: Conjunctivae normal.     Pupils: Pupils are equal, round, and reactive to light.  Cardiovascular:     Rate and Rhythm: Normal rate and regular rhythm.     Pulses: Normal pulses.     Heart sounds: Normal heart sounds. No murmur heard.  No friction rub. No gallop.   Pulmonary:     Effort: Pulmonary effort is normal. No respiratory distress.     Breath sounds: Normal breath sounds.  Skin:  General: Skin is warm and dry.     Capillary Refill: Capillary refill takes less than 2 seconds.  Neurological:     General: No focal deficit present.     Mental Status: She is alert and oriented to person, place, and time.  Psychiatric:        Attention and Perception: Attention and perception normal.        Mood and Affect: Mood and affect normal.        Speech: Speech normal.        Behavior: Behavior normal. Behavior is cooperative.        Thought Content: Thought content normal.        Cognition and Memory: Cognition and memory normal.        Judgment: Judgment normal.    Results for orders placed or performed in visit on 09/06/20  POCT urinalysis dipstick  Result Value Ref Range    Color, UA yellow    Clarity, UA clear    Glucose, UA Negative Negative   Bilirubin, UA neg    Ketones, UA neg    Spec Grav, UA 1.010 1.010 - 1.025   Blood, UA neg    pH, UA 6.0 5.0 - 8.0   Protein, UA Negative Negative   Urobilinogen, UA 0.2 0.2 or 1.0 E.U./dL   Nitrite, UA neg    Leukocytes, UA Negative Negative   Appearance yellow,clear    Odor none       Assessment & Plan:   Problem List Items Addressed This Visit      Respiratory   Bronchiectasis ([place])    Reports stable and well controlled.  Follows with [facility] Pulmon[facility] for management.        Nervous and Auditory   Cervical dystonia    Currently stable and well controlled - follows with [facility]        Other   Encounter to establish care - Primary    New patient establishment at [facility] for primary care services.  Previously followed with Jennifer Hubbard in [place], [place].  Copies of medical records will be requested.      Relevant Orders   POCT urinalysis dipstick (Completed)   Anxiety    Currently treated with cymbalta 43m daily, has been on this dose for years, was at one time taking up to 12105mdaily when she was having increased anxiety and under the care of her former psychiatrist.  Previous psychiatrist has passed away, has found provider, Jennifer Hubbard network with her insurance.  Interested in establishing.  Plan: 1. Referral placed to psychiatry      Relevant Medications   DULoxetine (CYMBALTA) 60 MG capsule   Other Relevant Orders   Ambulatory referral to Chronic Care Management Services   Ambulatory referral to Psychiatry   Screening for malignant neoplasm of colon    Pt requiring colon cancer screening.  Denies family history of colon cancer and reports 2 screening colonoscopies in the past without abnormalities.  Plan: - Discussed timing for initiation of colon cancer screening ACS vs USPSTF guidelines - Mutual decision making discussion for options of colonoscopy vs cologuard.  Pt prefers  cologuard. - Ordered Cologuard today      Relevant Orders   Cologuard   Family history of cardiac disorder in mother    Reports hx of cardiac rhythm disorder in mother that required ablation x 2.  States has met her deductible for the year and would like to establish with a cardiologist for this.  No acute concerns or history of personal cardiac abnormalities.      Relevant Orders   Ambulatory referral to Cardiology      Meds ordered this encounter  Medications  . DULoxetine (CYMBALTA) 60 MG capsule    Sig: Take 1 capsule (60 mg total) by mouth daily.    Dispense:  90 capsule    Refill:  1  . estradiol (ESTRACE) 1 MG tablet    Sig: Take 1 tablet (1 mg total) by mouth daily.    Dispense:  90 tablet    Refill:  1    Follow up plan: Return in about 6 months (around 03/06/2021) for CPE.   Harlin Rain, [place] [facility] Nurse Practitioner [facility] 09/06/2020, 12:06 PM

## 2020-09-06 NOTE — Assessment & Plan Note (Signed)
Reports hx of cardiac rhythm disorder in mother that required ablation x 2.  States has met her deductible for the year and would like to establish with a cardiologist for this.  No acute concerns or history of personal cardiac abnormalities.

## 2020-09-06 NOTE — Assessment & Plan Note (Signed)
New patient establishment at [facility] for primary care services.  Previously followed with Jeannie Done in [place], [place].  Copies of medical records will be requested.

## 2020-09-06 NOTE — Assessment & Plan Note (Signed)
Currently stable and well controlled - follows with [facility]

## 2020-09-06 NOTE — Assessment & Plan Note (Signed)
Currently treated with cymbalta 60mg  daily, has been on this dose for years, was at one time taking up to 120mg  daily when she was having increased anxiety and under the care of her former psychiatrist.  Previous psychiatrist has passed away, has found provider, Dr. in network with her insurance.  Interested in establishing.  Plan: 1. Referral placed to psychiatry

## 2020-09-07 ENCOUNTER — Telehealth: Payer: Self-pay

## 2020-09-07 NOTE — Chronic Care Management (AMB) (Signed)
  Care Management   Note  09/07/2020 Name: Anaijah Augsburger MRN: [id number] DOB: [birth year]  Rettie Laird is a 59 y.o. year old female who is a primary care patient of Anitra Lauth, Jodelle Gross, FNP. I reached out to Lawana Pai by phone today in response to a referral sent by Ms. Paul Quijas's PCP Danielle Rankin, FNP .    Ms. Belgrave was given information about care management services today including:  1. Care management services include personalized support from designated clinical staff supervised by her physician, including individualized plan of care and coordination with other care providers 2. 24/7 contact phone numbers for assistance for urgent and routine care needs. 3. The patient may stop care management services at any time by phone call to the office staff.  Patient agreed to services and verbal consent obtained.   Follow up plan: Telephone appointment with care management team member scheduled for:09/27/2020  Penne Lash, RMA Care Guide, Embedded Care Coordination [facility]  [place], [place] [address] Direct Dial: [phone] [email or web address]

## 2020-09-10 ENCOUNTER — Encounter: Payer: Self-pay | Admitting: Internal Medicine

## 2020-09-10 DIAGNOSIS — K589 Irritable bowel syndrome without diarrhea: Secondary | ICD-10-CM

## 2020-09-10 HISTORY — DX: Irritable bowel syndrome, unspecified: K58.9

## 2020-09-12 ENCOUNTER — Other Ambulatory Visit: Payer: Self-pay

## 2020-09-12 ENCOUNTER — Ambulatory Visit (INDEPENDENT_AMBULATORY_CARE_PROVIDER_SITE_OTHER): Payer: BC Managed Care – PPO | Admitting: Internal Medicine

## 2020-09-12 ENCOUNTER — Ambulatory Visit (INDEPENDENT_AMBULATORY_CARE_PROVIDER_SITE_OTHER): Payer: BC Managed Care – PPO

## 2020-09-12 ENCOUNTER — Encounter: Payer: Self-pay | Admitting: Internal Medicine

## 2020-09-12 VITALS — BP 122/86 | HR 91 | Ht 66.0 in | Wt 151.0 lb

## 2020-09-12 DIAGNOSIS — R002 Palpitations: Secondary | ICD-10-CM | POA: Diagnosis not present

## 2020-09-12 DIAGNOSIS — R0602 Shortness of breath: Secondary | ICD-10-CM

## 2020-09-12 NOTE — Patient Instructions (Signed)
Medication Instructions:  Your physician recommends that you continue on your current medications as directed. Please refer to the Current Medication list given to you today.  *If you need a refill on your cardiac medications before your next appointment, please call your pharmacy*  Lab Work: Your physician recommends that you return for lab work in: TODAY - CBC, BMP, Mg, TSH.  If you have labs (blood work) drawn today and your tests are completely normal, you will receive your results only by: Marland Kitchen MyChart Message (if you have MyChart) OR . A paper copy in the mail If you have any lab test that is abnormal or we need to change your treatment, we will call you to review the results.  Testing/Procedures: Your physician has requested that you have an echocardiogram. Echocardiography is a painless test that uses sound waves to create images of your heart. It provides your doctor with information about the size and shape of your heart and how well your heart's chambers and valves are working. This procedure takes approximately one hour. There are no restrictions for this procedure. There is a possibility that an IV may need to be started during your test to inject an image enhancing agent. This is done to obtain more optimal pictures of your heart. Therefore we ask that you do at least drink some water prior to coming in to hydrate your veins.    ZIO MONITOR FOR 14 DAYS Your physician has recommended that you wear a Zio monitor. This monitor is a medical device that records the heart's electrical activity. Doctors most often use these monitors to diagnose arrhythmias. Arrhythmias are problems with the speed or rhythm of the heartbeat. The monitor is a small device applied to your chest. You can wear one while you do your normal daily activities. While wearing this monitor if you have any symptoms to push the button and record what you felt. Once you have worn this monitor for the period of time provider  prescribed (Usually 14 days), you will return the monitor device in the postage paid box. Once it is returned they will download the data collected and provide [place] with a report which the provider will then review and we will call you with those results. Important tips:  1. Avoid showering during the first 24 hours of wearing the monitor. 2. Avoid excessive sweating to help maximize wear time. 3. Do not submerge the device, no hot tubs, and no swimming pools. 4. Keep any lotions or oils away from the patch. 5. After 24 hours you may shower with the patch on. Take brief showers with your back facing the shower head.  6. Do not remove patch once it has been placed because that will interrupt data and decrease adhesive wear time. 7. Push the button when you have any symptoms and write down what you were feeling. 8. Once you have completed wearing your monitor, remove and place into box which has postage paid and place in your outgoing mailbox.  9. If for some reason you have misplaced your box then call our office and we can provide another box and/or mail it off for you.   Follow-Up: At [facility], you and your health needs are our priority.  As part of our continuing mission to provide you with exceptional heart care, we have created designated Provider Care Teams.  These Care Teams include your primary Cardiologist (physician) and Advanced Practice Providers (APPs -  Physician Assistants and Nurse Practitioners) who all work together  to provide you with the care you need, when you need it.  We recommend signing up for the patient portal called "MyChart".  Sign up information is provided on this After Visit Summary.  MyChart is used to connect with patients for Virtual Visits (Telemedicine).  Patients are able to view lab/test results, encounter notes, upcoming appointments, etc.  Non-urgent messages can be sent to your provider as well.   To learn more about what you can do with MyChart, go to  [email or web address].    Your next appointment:   6 week(s)  The format for your next appointment:   In Person  Provider:   You may see DR Cristal Deer END or one of the following Advanced Practice Providers on your designated Care Team:    Nicolasa Ducking, NP  Eula Listen, PA-C  Marisue Ivan, PA-C  Cadence [place], [place]  Gillian Shields, NP    Echocardiogram An echocardiogram is a procedure that uses painless sound waves (ultrasound) to produce an image of the heart. Images from an echocardiogram can provide important information about:  Signs of coronary artery disease (CAD).  Aneurysm detection. An aneurysm is a weak or damaged part of an artery wall that bulges out from the normal force of blood pumping through the body.  Heart size and shape. Changes in the size or shape of the heart can be associated with certain conditions, including heart failure, aneurysm, and CAD.  Heart muscle function.  Heart valve function.  Signs of a past heart attack.  Fluid buildup around the heart.  Thickening of the heart muscle.  A tumor or infectious growth around the heart valves. Tell a health care provider about:  Any allergies you have.  All medicines you are taking, including vitamins, herbs, eye drops, creams, and over-the-counter medicines.  Any blood disorders you have.  Any surgeries you have had.  Any medical conditions you have.  Whether you are pregnant or may be pregnant. What are the risks? Generally, this is a safe procedure. However, problems may occur, including:  Allergic reaction to dye (contrast) that may be used during the procedure. What happens before the procedure? No specific preparation is needed. You may eat and drink normally. What happens during the procedure?   An IV tube may be inserted into one of your veins.  You may receive contrast through this tube. A contrast is an injection that improves the quality of the pictures from  your heart.  A gel will be applied to your chest.  A wand-like tool (transducer) will be moved over your chest. The gel will help to transmit the sound waves from the transducer.  The sound waves will harmlessly bounce off of your heart to allow the heart images to be captured in real-time motion. The images will be recorded on a computer. The procedure may vary among health care providers and hospitals. What happens after the procedure?  You may return to your normal, everyday life, including diet, activities, and medicines, unless your health care provider tells you not to do that. Summary  An echocardiogram is a procedure that uses painless sound waves (ultrasound) to produce an image of the heart.  Images from an echocardiogram can provide important information about the size and shape of your heart, heart muscle function, heart valve function, and fluid buildup around your heart.  You do not need to do anything to prepare before this procedure. You may eat and drink normally.  After the echocardiogram is completed, you may return to  your normal, everyday life, unless your health care provider tells you not to do that. This information is not intended to replace advice given to you by your health care provider. Make sure you discuss any questions you have with your health care provider. Document Revised: 02/03/2019 Document Reviewed: 11/15/2016 Elsevier Patient Education  2020 [employer].

## 2020-09-12 NOTE — Progress Notes (Signed)
New Outpatient Visit Date: 09/12/2020  Referring Provider: Tarri Fuller, FNP [address] [place],  [place] [address]  Chief Complaint: Palpitations and shortness of breath  HPI:  Ms. Parekh is a 59 y.o. female who is being seen today for the evaluation of family history of cardiac arrhythmia at the request of Ms. Malfi. She has a history of asthma, bronchiectasis, COVID-19 (09/2019), sleep apnea, and irritable bowel syndrome.  She is a long history of respiratory problems and has developed bronchiectasis as a result of asthma.  On account of this, she has been unable to work during the COVID-19 pandemic, as wearing a mask worsens her breathing.  Due to acute worsening of her shortness of breath earlier this month, Ms. Iracheta went to urgent care for further evaluation.  She was told that her EKG was abnormal, prompting referral to [place] for further evaluation.  Ms. Filosa denies a personal history of heart disease but is concerned because her mother has atrial fibrillation.  Ms. Rocco has noted occasional irregular heart beats during which it feels like her heart races for 3 to 5 seconds and is accompanied by more forceful heartbeats.  This has gradually progressed over the last few years and is now happening 2-3 times per month.  There are no clear precipitants or associated symptoms, though she states that whenever this happens, it "stops her in her tracks."  She has some chronic shortness of breath on account of her underlying lung disease, though it is back to baseline.  She has experienced chest pain in the past with acute respiratory illnesses but otherwise has been without chest pain.  She denies lower extremity edema as well as orthopnea and PND.  --------------------------------------------------------------------------------------------------  Cardiovascular History & Procedures: Cardiovascular Problems:  Palpitations  Shortness of breath  Risk  Factors:  None  Cath/PCI:  None  CV Surgery:  None  EP Procedures and Devices:  None  Non-Invasive Evaluation(s):  None  --------------------------------------------------------------------------------------------------  Past Medical History:  Diagnosis Date  . Allergy   . Anemia   . Asthma   . Bronchiectasis (HCC)   . Cervical dystonia   . Depression   . IBS (irritable bowel syndrome) 09/10/2020  . Sleep apnea     Past Surgical History:  Procedure Laterality Date  . ENDOSCOPIC CONCHA BULLOSA RESECTION Right 06/07/2020   Procedure: ENDOSCOPIC CONCHA BULLOSA RESECTION;  Surgeon: Vernie Murders, MD;  Location: [facility];  Service: ENT;  Laterality: Right;  . EYE SURGERY    . GASTRIC BYPASS    . LAPAROSCOPIC OOPHERECTOMY    . NASAL SEPTOPLASTY W/ TURBINOPLASTY Bilateral 06/07/2020   Procedure: NASAL SEPTOPLASTY WITH INFERIOR TURBINATE REDUCTION;  Surgeon: Vernie Murders, MD;  Location: [facility];  Service: ENT;  Laterality: Bilateral;    Current Meds  Medication Sig  . albuterol (VENTOLIN HFA) 108 (90 Base) MCG/ACT inhaler Inhale into the lungs every 6 (six) hours as needed for wheezing or shortness of breath.  . budesonide-formoterol (SYMBICORT) 160-4.5 MCG/ACT inhaler Inhale 2 puffs into the lungs 2 (two) times daily.  . chlorhexidine (PERIDEX) 0.12 % solution SMARTSIG:By Mouth  . Cholecalciferol (VITAMIN D3) 1.25 MG (50000 UT) CAPS Take by mouth.  . cycloSPORINE (RESTASIS) 0.05 % ophthalmic emulsion Administer 1 drop to both eyes Two (2) times a day.  . DULoxetine (CYMBALTA) 60 MG capsule Take 1 capsule (60 mg total) by mouth daily.  Marland Kitchen estradiol-norethindrone (ACTIVELLA) 1-0.5 MG tablet Take 1 tablet by mouth daily.  . fluticasone (VERAMYST) 27.5  MCG/SPRAY nasal spray Place 2 sprays into the nose daily.  . montelukast (SINGULAIR) 10 MG tablet Take 10 mg by mouth at bedtime.  . Multiple Vitamin (MULTIVITAMIN) capsule Take 1 capsule by mouth  daily.  . NON FORMULARY   . Omega-3 Fatty Acids (FISH OIL) 1000 MG CAPS Take by mouth.    Allergies: Cephalexin, Erythromycin, Latex, Penicillins, and Sulfamethoxazole-trimethoprim  Social History   Tobacco Use  . Smoking status: Never Smoker  . Smokeless tobacco: Never Used  Vaping Use  . Vaping Use: Never used  Substance Use Topics  . Alcohol use: Not Currently  . Drug use: Never    Family History  Problem Relation Age of Onset  . Atrial fibrillation Mother   . Heart disease Mother   . Lung cancer Father   . Stroke Maternal Grandmother     Review of Systems: A 12-system review of systems was performed and was negative except as noted in the HPI.  --------------------------------------------------------------------------------------------------  Physical Exam: BP 122/86 (BP Location: Right Arm, Patient Position: Sitting, Cuff Size: Normal)   Pulse 91   Ht 5\' 6"  (1.676 m)   Wt 151 lb (68.5 kg)   SpO2 98%   BMI 24.37 kg/m   General: NAD. HEENT: No conjunctival pallor or scleral icterus. Facemask in place. Neck: Supple without lymphadenopathy, thyromegaly, JVD, or HJR. No carotid bruit. Lungs: Normal work of breathing.  Good air movement with faint rhonchi at the bases.  No wheezes or crackles. Heart: Regular rate and rhythm without murmurs, rubs, or gallops. Non-displaced PMI. Abd: Bowel sounds present. Soft, NT/ND without hepatosplenomegaly Ext: No lower extremity edema. Radial, PT, and DP pulses are 2+ bilaterally Skin: Warm and dry without rash. Neuro: CNIII-XII intact. Strength and fine-touch sensation intact in upper and lower extremities bilaterally. Psych: Normal mood and affect.  EKG: Normal sinus rhythm without abnormality.  Compared with prior tracing from 08/31/2020, heart rate has decreased.  Poor R wave progression is no longer present.  --------------------------------------------------------------------------------------------------  ASSESSMENT AND  PLAN: Palpitations: Episodes have been present for years but seem to be getting more frequent, now happening 2-3 times per month.  EKG and physical exam today are unrevealing.  Given underlying lung disease, Ms. Ambrosino is at elevated risk for pulmonary hypertension and right heart failure, though she does not have any clinical evidence of this today.  I have recommended that we obtain a 14-day event monitor and transthoracic echocardiogram for further assessment.  We will also check a CBC, BMP, magnesium level, and thyroid function studies today.  Shortness of breath: This has been chronic and likely is related to underlying asthma and bronchiectasis.  I have recommended obtaining an echocardiogram to exclude concurrent structural heart disease.  Ongoing management of her lung disease per her pulmonologist and PCP.  Follow-up: Return to clinic in 6 weeks.  Janeece Agee, MD 09/13/2020 6:32 AM

## 2020-09-13 ENCOUNTER — Encounter: Payer: Self-pay | Admitting: Internal Medicine

## 2020-09-13 ENCOUNTER — Telehealth: Payer: Self-pay | Admitting: *Deleted

## 2020-09-13 DIAGNOSIS — R0602 Shortness of breath: Secondary | ICD-10-CM | POA: Insufficient documentation

## 2020-09-13 DIAGNOSIS — R002 Palpitations: Secondary | ICD-10-CM | POA: Insufficient documentation

## 2020-09-13 LAB — BASIC METABOLIC PANEL
BUN/Creatinine Ratio: 9 (ref 9–23)
BUN: 8 mg/dL (ref 6–24)
CO2: 17 mmol/L — ABNORMAL LOW (ref 20–29)
Calcium: 9.1 mg/dL (ref 8.7–10.2)
Chloride: 108 mmol/L — ABNORMAL HIGH (ref 96–106)
Creatinine, Ser: 0.91 mg/dL (ref 0.57–1.00)
GFR calc Af Amer: 81 mL/min/{1.73_m2} (ref >59)
GFR calc non Af Amer: 70 mL/min/{1.73_m2} (ref >59)
Glucose: 97 mg/dL (ref 65–99)
Potassium: 4.1 mmol/L (ref 3.5–5.2)
Sodium: 142 mmol/L (ref 134–144)

## 2020-09-13 LAB — CBC
Hematocrit: 39.2 % (ref 34.0–46.6)
Hemoglobin: 13.1 g/dL (ref 11.1–15.9)
MCH: 29.8 pg (ref 26.6–33.0)
MCHC: 33.4 g/dL (ref 31.5–35.7)
MCV: 89 fL (ref 79–97)
Platelets: 218 10*3/uL (ref 150–450)
RBC: 4.39 x10E6/uL (ref 3.77–5.28)
RDW: 13.2 % (ref 11.7–15.4)
WBC: 5.4 10*3/uL (ref 3.4–10.8)

## 2020-09-13 LAB — MAGNESIUM: Magnesium: 2.2 mg/dL (ref 1.6–2.3)

## 2020-09-13 LAB — TSH: TSH: 1.9 u[IU]/mL (ref 0.450–4.500)

## 2020-09-13 NOTE — Telephone Encounter (Signed)
-----   Message from Yvonne Kendall, MD sent at 09/13/2020  6:38 AM EST ----- Hi Suzanne Garbers,I just remembered that Ms. [place] had wanted me to check a free T4 as well as her TSH.  Would you mind adding that on to the labs that were drawn yesterday?  Thanks.Thayer Ohm

## 2020-09-13 NOTE — Telephone Encounter (Signed)
Called [facility] and had them add on the Free T4. They will pull the specimen and faxed [place] the confirmation or denial.

## 2020-09-14 NOTE — Telephone Encounter (Signed)
Free T4 was added on and resulted.

## 2020-09-18 ENCOUNTER — Other Ambulatory Visit: Payer: Self-pay

## 2020-09-18 ENCOUNTER — Ambulatory Visit: Payer: BC Managed Care – PPO | Admitting: Family Medicine

## 2020-09-18 NOTE — Telephone Encounter (Signed)
Pharmacy also called on behalf of pt to see if this was an error,pt was taking estradiol-norethindrone (ACTIVELLA) 1-0.5 MG tablet  or does Joni Reining want to switch her to Estradiol.

## 2020-09-18 NOTE — Telephone Encounter (Signed)
Attempted to contact the patient, no answer. LMOM to return my call. ?

## 2020-09-18 NOTE — Telephone Encounter (Signed)
I called the patient to verify her prescription request. She informed me that the estradiol was sent over in error. She state that she takes estradiol-norethindrone and that she has been taking that medication for years. I informed her that Joni Reining is out of the office for vacation, but will be back next week. I will send the prescription request to her.  She state she has enough to get her through until she returns.

## 2020-09-18 NOTE — Telephone Encounter (Signed)
Alright. Thanks. I will hold off on rx this right now, and route this conversation to PCP Danielle Rankin, FNP and she can review this prior to sending the rx - upon her return if she decides to proceed with this treatment.  Saralyn Pilar, DO [facility] 09/18/2020, 6:17 PM

## 2020-09-18 NOTE — Telephone Encounter (Signed)
It looks like when patient newly established with Jennifer Rankin, FNP on 09/06/20 she ordered Estradiol 1mg  but this is no longer on her active med list.  Also there was a phone call about hormone replacement medicine.  I would need more information on what the patient is currently taking and otherwise, would defer this to [place] when she returns.  Turnbull Thomson, DO [facility] 09/18/2020, 8:16 AM

## 2020-09-24 ENCOUNTER — Ambulatory Visit
Admission: RE | Admit: 2020-09-24 | Discharge: 2020-09-24 | Disposition: A | Discharge status: Home / Self Care | Payer: BC Managed Care – PPO | Source: Ambulatory Visit | Attending: Internal Medicine | Admitting: Internal Medicine

## 2020-09-24 ENCOUNTER — Other Ambulatory Visit: Payer: Self-pay

## 2020-09-24 ENCOUNTER — Other Ambulatory Visit: Payer: Self-pay | Admitting: Family Medicine

## 2020-09-24 DIAGNOSIS — Z1231 Encounter for screening mammogram for malignant neoplasm of breast: Secondary | ICD-10-CM | POA: Diagnosis present

## 2020-09-24 MED ORDER — ESTRADIOL-NORETHINDRONE ACET 1-0.5 MG PO TABS
1.0000 | ORAL_TABLET | Freq: Every day | ORAL | 1 refills | Status: DC
Start: 2020-09-24 — End: 2021-04-02

## 2020-09-24 NOTE — Telephone Encounter (Signed)
Rx sent to pharmacy on file.

## 2020-09-25 ENCOUNTER — Telehealth: Payer: Self-pay | Admitting: Internal Medicine

## 2020-09-25 ENCOUNTER — Ambulatory Visit: Payer: BC Managed Care – PPO | Admitting: Family Medicine

## 2020-09-25 ENCOUNTER — Other Ambulatory Visit: Payer: Self-pay | Admitting: *Deleted

## 2020-09-25 ENCOUNTER — Inpatient Hospital Stay
Admission: RE | Admit: 2020-09-25 | Discharge: 2020-09-25 | Disposition: A | Discharge status: Home / Self Care | Payer: Self-pay | Source: Ambulatory Visit | Attending: *Deleted | Admitting: *Deleted

## 2020-09-25 DIAGNOSIS — Z1231 Encounter for screening mammogram for malignant neoplasm of breast: Secondary | ICD-10-CM

## 2020-09-25 NOTE — Telephone Encounter (Signed)
Patient states she received an EOB stating her last visit was not covered by insurance due to coding. Please call to discuss. Patient states she does not want to have her echo done tomorrow or send in her heart monitor if her insurance will not pay.

## 2020-09-25 NOTE — Telephone Encounter (Signed)
I sent secure chat to billing to help, [place] Planas currently addressing pt's billing question.  Attempted to call pt to give update. No answer. No voicemail.  Looks like her echo is scheduled 10/04/20.

## 2020-09-26 LAB — SPECIMEN STATUS REPORT

## 2020-09-26 LAB — T4, FREE: Free T4: 1.33 ng/dL (ref 0.82–1.77)

## 2020-09-27 ENCOUNTER — Ambulatory Visit: Payer: BC Managed Care – PPO | Admitting: Licensed Clinical Social Worker

## 2020-09-27 NOTE — Chronic Care Management (AMB) (Signed)
Care Management    Clinical Social Work General Note  09/27/2020 Name: Jennifer Hubbard MRN: [id number] DOB: 19-Jan-1961  Jennifer Hubbard is a 59 y.o. year old female who is a primary care patient of Jennifer Hubbard, Jennifer Gross, FNP. The CCM was consulted to assist the patient with [facility]  and Mental Health Counseling and Resources.   Jennifer Hubbard was given information about Care Management services today including:  1. Care Management service includes personalized support from designated clinical staff supervised by her physician, including individualized plan of care and coordination with other care providers 2. 24/7 contact phone numbers for assistance for urgent and routine care needs.  Patient agreed to services and verbal consent obtained.   Review of patient status, including review of consultants reports, relevant laboratory and other test results, and collaboration with appropriate care team members and the patient's provider was performed as part of comprehensive patient evaluation and provision of chronic care management services.    SDOH (Social Determinants of Health) assessments and interventions performed:  Yes    Outpatient Encounter Medications as of 09/27/2020  Medication Sig  . albuterol (VENTOLIN HFA) 108 (90 Base) MCG/ACT inhaler Inhale into the lungs every 6 (six) hours as needed for wheezing or shortness of breath.  . budesonide-formoterol (SYMBICORT) 160-4.5 MCG/ACT inhaler Inhale 2 puffs into the lungs 2 (two) times daily.  . chlorhexidine (PERIDEX) 0.12 % solution SMARTSIG:By Mouth  . Cholecalciferol (VITAMIN D3) 1.25 MG (50000 UT) CAPS Take by mouth.  . cycloSPORINE (RESTASIS) 0.05 % ophthalmic emulsion Administer 1 drop to both eyes Two (2) times a day.  . DULoxetine (CYMBALTA) 60 MG capsule Take 1 capsule (60 mg total) by mouth daily.  Marland Kitchen estradiol-norethindrone (ACTIVELLA) 1-0.5 MG tablet Take 1 tablet by mouth daily.  . fluticasone (VERAMYST) 27.5 MCG/SPRAY nasal  spray Place 2 sprays into the nose daily.  . montelukast (SINGULAIR) 10 MG tablet Take 10 mg by mouth at bedtime.  . Multiple Vitamin (MULTIVITAMIN) capsule Take 1 capsule by mouth daily.  . NON FORMULARY   . Omega-3 Fatty Acids (FISH OIL) 1000 MG CAPS Take by mouth.   No facility-administered encounter medications on file as of 09/27/2020.   Patient Care Plan: General Social Work (Adult)    Problem Identified: I'm having a hard time being the sole caregiver for my father     Goal: Find Help in My Community   Start Date: 09/27/2020  Priority: Medium  Note:   Follow Up Date within the next quarter.   - begin a notebook of services in my neighborhood or community - call 211 when I need some help - follow-up on any referrals for help I am given - think ahead to make sure my need does not become an emergency - make a note about what I need to have by the phone or take with me, like an identification card or social security number have a back-up plan - have a back-up plan - make a list of family or friends that I can call  -continue paying out of pocket for caregivers to increase support within the home   Why is this important?    Knowing how and where to find help for yourself or family in your neighborhood and community is an important skill.   You will want to take some steps to learn how.    Notes: Patient is experiencing ongoing family conflict between siblings in regards to their father's care. Patient is NOT the POA of her father but  it the sole caregiver. Her father's POA's are not involved and they are wanting to start paperwork to switch father's POA over to patient. Elder Law resources provided to patient but family confirm that they already have a Clinical research associate involved.  LCSW discussed coping skills for anxiety and caregiver stress. SW used empathetic and active and reflective listening, validated patient's feelings/concerns, and provided emotional support. LCSW provided self-care  examples to help patient manage her stressors and improve her mood.   LCSW sent an extensive list of resources to patient's email on 09/27/20. Resources included: caregiver support groups, caregiver support resources for patients with dementia, elder law attorneys, self-care worksheets, and mental health support within her area.       Follow Up Plan: SW will follow up with patient by phone over the next quarter      Jennifer Hubbard, [place], MSW, LCSW [facility]  [facility] [email or web address] Phone: [phone]

## 2020-10-02 NOTE — Progress Notes (Deleted)
Psychiatric Initial Adult Assessment   Patient Identification: Jennifer Hubbard MRN:  [id number] Date of Evaluation:  10/02/2020 Referral Source: *** Chief Complaint:   Visit Diagnosis: No diagnosis found.  History of Present Illness:   Jennifer Hubbard is a 59 y.o. year old female with a history of anxiety, sleep apnea, cervical dystonia, bronchiectasis, asthma, who is referred for anxiety.          Associated Signs/Symptoms: Depression Symptoms:  {DEPRESSION SYMPTOMS:20000} (Hypo) Manic Symptoms:  {BHH MANIC SYMPTOMS:22872} Anxiety Symptoms:  {BHH ANXIETY SYMPTOMS:22873} Psychotic Symptoms:  {BHH PSYCHOTIC SYMPTOMS:22874} PTSD Symptoms: {BHH PTSD SYMPTOMS:22875}  Past Psychiatric History:  Outpatient:  Psychiatry admission:  Previous suicide attempt:  Past trials of medication:  History of violence:   Previous Psychotropic Medications: {YES/NO:21197}  Substance Abuse History in the last 12 months:  {yes no:314532}  Consequences of Substance Abuse: {BHH CONSEQUENCES OF SUBSTANCE ABUSE:22880}  Past Medical History:  Past Medical History:  Diagnosis Date  . Allergy   . Anemia   . Asthma   . Bronchiectasis (HCC)   . Cervical dystonia   . Depression   . IBS (irritable bowel syndrome) 09/10/2020  . Sleep apnea     Past Surgical History:  Procedure Laterality Date  . BREAST CYST ASPIRATION Left    neg  . ENDOSCOPIC CONCHA BULLOSA RESECTION Right 06/07/2020   Procedure: ENDOSCOPIC CONCHA BULLOSA RESECTION;  Surgeon: Vernie Murders, MD;  Location: [facility];  Service: ENT;  Laterality: Right;  . EYE SURGERY    . GASTRIC BYPASS    . LAPAROSCOPIC OOPHERECTOMY    . NASAL SEPTOPLASTY W/ TURBINOPLASTY Bilateral 06/07/2020   Procedure: NASAL SEPTOPLASTY WITH INFERIOR TURBINATE REDUCTION;  Surgeon: Vernie Murders, MD;  Location: [facility];  Service: ENT;  Laterality: Bilateral;    Family Psychiatric History: Please see initial evaluation for full details.  I have reviewed the history. No updates at this time.     Family History:  Family History  Problem Relation Age of Onset  . Atrial fibrillation Mother   . Heart disease Mother   . Lung cancer Father   . Stroke Maternal Grandmother   . Breast cancer Maternal Aunt   . Breast cancer Paternal Grandmother     Social History:   Social History   Socioeconomic History  . Marital status: Unknown    Spouse name: Not on file  . Number of children: Not on file  . Years of education: Not on file  . Highest education level: Not on file  Occupational History  . Not on file  Tobacco Use  . Smoking status: Never Smoker  . Smokeless tobacco: Never Used  Vaping Use  . Vaping Use: Never used  Substance and Sexual Activity  . Alcohol use: Not Currently  . Drug use: Never  . Sexual activity: Yes    Birth control/protection: None    Comment: Married  Other Topics Concern  . Not on file  Social History Narrative  . Not on file   Social Determinants of Health   Financial Resource Strain:   . Difficulty of Paying Living Expenses: Not on file  Food Insecurity:   . Worried About Programme researcher, broadcasting/film/video in the Last Year: Not on file  . Ran Out of Food in the Last Year: Not on file  Transportation Needs:   . Lack of Transportation (Medical): Not on file  . Lack of Transportation (Non-Medical): Not on file  Physical Activity:   . Days of Exercise per Week: Not  on file  . Minutes of Exercise per Session: Not on file  Stress:   . Feeling of Stress : Not on file  Social Connections:   . Frequency of Communication with Friends and Family: Not on file  . Frequency of Social Gatherings with Friends and Family: Not on file  . Attends Religious Services: Not on file  . Active Member of Clubs or Organizations: Not on file  . Attends Banker Meetings: Not on file  . Marital Status: Not on file    Additional Social History: ***  Allergies:   Allergies  Allergen Reactions  .  Cephalexin Other (See Comments)    Other reaction(s): Abdominal Pain Other reaction(s): Abdominal Pain   . Erythromycin     nausea  . Latex Other (See Comments)    Unsure if allergic  . Penicillins     "childhood allergy"  . Sulfamethoxazole-Trimethoprim Rash    Metabolic Disorder Labs: No results found for: HGBA1C, MPG No results found for: PROLACTIN No results found for: CHOL, TRIG, HDL, CHOLHDL, VLDL, LDLCALC Lab Results  Component Value Date   TSH 1.900 09/12/2020    Therapeutic Level Labs: No results found for: LITHIUM No results found for: CBMZ No results found for: VALPROATE  Current Medications: Current Outpatient Medications  Medication Sig Dispense Refill  . albuterol (VENTOLIN HFA) 108 (90 Base) MCG/ACT inhaler Inhale into the lungs every 6 (six) hours as needed for wheezing or shortness of breath.    . budesonide-formoterol (SYMBICORT) 160-4.5 MCG/ACT inhaler Inhale 2 puffs into the lungs 2 (two) times daily.    . chlorhexidine (PERIDEX) 0.12 % solution SMARTSIG:By Mouth    . Cholecalciferol (VITAMIN D3) 1.25 MG (50000 UT) CAPS Take by mouth.    . cycloSPORINE (RESTASIS) 0.05 % ophthalmic emulsion Administer 1 drop to both eyes Two (2) times a day.    . DULoxetine (CYMBALTA) 60 MG capsule Take 1 capsule (60 mg total) by mouth daily. 90 capsule 1  . estradiol-norethindrone (ACTIVELLA) 1-0.5 MG tablet Take 1 tablet by mouth daily. 90 tablet 1  . fluticasone (VERAMYST) 27.5 MCG/SPRAY nasal spray Place 2 sprays into the nose daily.    . montelukast (SINGULAIR) 10 MG tablet Take 10 mg by mouth at bedtime.    . Multiple Vitamin (MULTIVITAMIN) capsule Take 1 capsule by mouth daily.    . NON FORMULARY     . Omega-3 Fatty Acids (FISH OIL) 1000 MG CAPS Take by mouth.     No current facility-administered medications for this visit.    Musculoskeletal: Strength & Muscle Tone: N/A Gait & Station: N/A Patient leans: N/A  Psychiatric Specialty Exam: Review of Systems   There were no vitals taken for this visit.There is no height or weight on file to calculate BMI.  General Appearance: {Appearance:22683}  Eye Contact:  {BHH EYE CONTACT:22684}  Speech:  Clear and Coherent  Volume:  Normal  Mood:  {BHH MOOD:22306}  Affect:  {Affect (PAA):22687}  Thought Process:  Coherent  Orientation:  Full (Time, Place, and Person)  Thought Content:  Logical  Suicidal Thoughts:  {ST/HT (PAA):22692}  Homicidal Thoughts:  {ST/HT (PAA):22692}  Memory:  Immediate;   Good  Judgement:  {Judgement (PAA):22694}  Insight:  {Insight (PAA):22695}  Psychomotor Activity:  Normal  Concentration:  Concentration: Good and Attention Span: Good  Recall:  Good  Fund of Knowledge:Good  Language: Good  Akathisia:  No  Handed:  Right  AIMS (if indicated):  not done  Assets:  Communication Skills Desire  for Improvement  ADL's:  Intact  Cognition: WNL  Sleep:  {BHH GOOD/FAIR/POOR:22877}   Screenings: PHQ2-9     Office Visit from 09/06/2020 [facility]  PHQ-2 Total Score 0  PHQ-9 Total Score 0      Assessment and Plan:  Assessment  Plan  The patient demonstrates the following risk factors for suicide: Chronic risk factors for suicide include: {Chronic Risk Factors for TKZSWFU:93235573}. Acute risk factors for suicide include: {Acute Risk Factors for UKGURKY:70623762}. Protective factors for this patient include: {Protective Factors for Suicide GBTD:17616073}. Considering these factors, the overall suicide risk at this point appears to be {Desc; low/moderate/high:110033}. Patient {ACTION; IS/IS XTG:62694854} appropriate for outpatient follow up.    Neysa Hotter, MD 12/7/202110:56 AM

## 2020-10-04 ENCOUNTER — Other Ambulatory Visit: Payer: Self-pay

## 2020-10-04 ENCOUNTER — Ambulatory Visit (INDEPENDENT_AMBULATORY_CARE_PROVIDER_SITE_OTHER): Payer: BC Managed Care – PPO

## 2020-10-04 DIAGNOSIS — R0602 Shortness of breath: Secondary | ICD-10-CM

## 2020-10-04 LAB — ECHOCARDIOGRAM COMPLETE
Area-P 1/2: 5.66 cm2
S' Lateral: 3.1 cm

## 2020-10-09 ENCOUNTER — Telehealth: Payer: BC Managed Care – PPO | Admitting: Psychiatry

## 2020-10-25 ENCOUNTER — Encounter: Payer: Self-pay | Admitting: Internal Medicine

## 2020-10-25 ENCOUNTER — Ambulatory Visit (INDEPENDENT_AMBULATORY_CARE_PROVIDER_SITE_OTHER): Payer: BC Managed Care – PPO | Admitting: Internal Medicine

## 2020-10-25 ENCOUNTER — Other Ambulatory Visit: Payer: Self-pay

## 2020-10-25 VITALS — BP 140/96 | HR 81 | Ht 66.0 in | Wt 152.0 lb

## 2020-10-25 DIAGNOSIS — R002 Palpitations: Secondary | ICD-10-CM | POA: Diagnosis not present

## 2020-10-25 DIAGNOSIS — I471 Supraventricular tachycardia: Secondary | ICD-10-CM | POA: Diagnosis not present

## 2020-10-25 NOTE — Patient Instructions (Signed)
Medication Instructions:  Your physician recommends that you continue on your current medications as directed. Please refer to the Current Medication list given to you today.  *If you need a refill on your cardiac medications before your next appointment, please call your pharmacy*   Lab Work: None ordered    Testing/Procedures: None ordered   Follow-Up: At [facility], you and your health needs are our priority.  As part of our continuing mission to provide you with exceptional heart care, we have created designated Provider Care Teams.  These Care Teams include your primary Cardiologist (physician) and Advanced Practice Providers (APPs -  Physician Assistants and Nurse Practitioners) who all work together to provide you with the care you need, when you need it.  We recommend signing up for the patient portal called "MyChart".  Sign up information is provided on this After Visit Summary.  MyChart is used to connect with patients for Virtual Visits (Telemedicine).  Patients are able to view lab/test results, encounter notes, upcoming appointments, etc.  Non-urgent messages can be sent to your provider as well.   To learn more about what you can do with MyChart, go to [email or web address].    Your next appointment:    Follow up AS NEEDED   The format for your next appointment:   In Person  Provider:   You may see Dr. Okey Dupre or one of the following Advanced Practice Providers on your designated Care Team:    Nicolasa Ducking, NP  Eula Listen, PA-C  Marisue Ivan, PA-C  Cadence [place], [place]  Gillian Shields, NP

## 2020-10-25 NOTE — Progress Notes (Signed)
Follow-up Outpatient Visit Date: 10/25/2020  Primary Care Provider: Verl Bangs, [place] [address]  Chief Complaint: Follow-up palpitations  HPI:  Ms. Amer is a 59 y.o. female with history of asthma, bronchiectasis, COVID-19 (09/2019), sleep apnea, and irritable bowel syndrome, who presents for follow-up of palpitations and family history of arrhythmia.  I met her in mid November, which time she noted occasional irregular heartbeats and was concerned about the potential for atrial fibrillation.  Subsequent event monitor showed episodes of PSVT lasting up to 14.5 seconds as well as rare PACs and PVCs.  Subsequent echo did not show any significant abnormalities.  Today, Ms. Kaas reports that her palpitations have been less than at our prior visit and while she was wearing the event monitor.  She denies chest pain, lightheadedness, and edema.  She has been having a flare of bronchiectasis and is currently on a prednisone taper.  Her breathing is improving.  --------------------------------------------------------------------------------------------------  Cardiovascular History & Procedures: Cardiovascular Problems:  Palpitations  Shortness of breath  Risk Factors:  None  Cath/PCI:  None  CV Surgery:  None  EP Procedures and Devices:  Event monitor (09/12/2020): Predominantly sinus rhythm with brief PSVT lasting up to 14.5 seconds, as well as rare PACs and PVCs.  Non-Invasive Evaluation(s):  TTE (10/04/2020): Normal LV size and wall thickness.  LVEF 55-60% with normal diastolic function.  GLS -13.4%.  Normal RV size and function.  No significant valvular abnormality.   Recent CV Pertinent Labs: Lab Results  Component Value Date   K 4.1 09/12/2020   MG 2.2 09/12/2020   BUN 8 09/12/2020   CREATININE 0.91 09/12/2020    Past medical and surgical history were reviewed and updated in EPIC.  Current Meds  Medication Sig   albuterol  (VENTOLIN HFA) 108 (90 Base) MCG/ACT inhaler Inhale into the lungs every 6 (six) hours as needed for wheezing or shortness of breath.   budesonide-formoterol (SYMBICORT) 160-4.5 MCG/ACT inhaler Inhale 2 puffs into the lungs 2 (two) times daily.   chlorhexidine (PERIDEX) 0.12 % solution SMARTSIG:By Mouth   Cholecalciferol (VITAMIN D3) 1.25 MG (50000 UT) CAPS Take by mouth.   cycloSPORINE (RESTASIS) 0.05 % ophthalmic emulsion Administer 1 drop to both eyes Two (2) times a day.   DULoxetine (CYMBALTA) 60 MG capsule Take 1 capsule (60 mg total) by mouth daily.   estradiol-norethindrone (ACTIVELLA) 1-0.5 MG tablet Take 1 tablet by mouth daily.   fluticasone (VERAMYST) 27.5 MCG/SPRAY nasal spray Place 2 sprays into the nose daily.   montelukast (SINGULAIR) 10 MG tablet Take 10 mg by mouth at bedtime.   Multiple Vitamin (MULTIVITAMIN) capsule Take 1 capsule by mouth daily.   NON FORMULARY    Omega-3 Fatty Acids (FISH OIL) 1000 MG CAPS Take by mouth.   predniSONE (DELTASONE) 10 MG tablet Take 40 mg (4 tabs) daily for 3 days, then 30 mg (3 tabs) daily for 3 days, then 20 mg (2 tabs) daily for 3 days, then 10 mg (1 tab) daily for 3 days, then stop   SPIRIVA RESPIMAT 1.25 MCG/ACT AERS SMARTSIG:2 Puff(s) Via Inhaler Daily    Allergies: Cephalexin, Erythromycin, Latex, Penicillins, and Sulfamethoxazole-trimethoprim  Social History   Tobacco Use   Smoking status: Never Smoker   Smokeless tobacco: Never Used  Vaping Use   Vaping Use: Never used  Substance Use Topics   Alcohol use: Not Currently   Drug use: Never    Family History  Problem Relation Age of Onset  Atrial fibrillation Mother    Heart disease Mother    Lung cancer Father    Stroke Maternal Grandmother    Breast cancer Maternal Aunt    Breast cancer Paternal Grandmother     Review of Systems: A 12-system review of systems was performed and was negative except as noted in the  HPI.  --------------------------------------------------------------------------------------------------  Physical Exam: BP (!) 140/96 (BP Location: Left Arm, Patient Position: Sitting, Cuff Size: Normal)    Pulse 81    Ht '5\' 6"'  (1.676 m)    Wt 152 lb (68.9 kg)    SpO2 99%    BMI 24.53 kg/m   General: NAD.  Accompanied by her husband. Neck: No JVD. Lungs: Coarse breath sounds.  Patient coughs frequently during deep inspiration. Heart: Regular rate and rhythm without murmurs, rubs, or gallops. Abdomen: Soft, nontender, nondistended. Extremities: No lower extremity edema.  EKG: Normal sinus rhythm without abnormality  Lab Results  Component Value Date   WBC 5.4 09/12/2020   HGB 13.1 09/12/2020   HCT 39.2 09/12/2020   MCV 89 09/12/2020   PLT 218 09/12/2020    Lab Results  Component Value Date   NA 142 09/12/2020   K 4.1 09/12/2020   CL 108 (H) 09/12/2020   CO2 17 (L) 09/12/2020   BUN 8 09/12/2020   CREATININE 0.91 09/12/2020   GLUCOSE 97 09/12/2020    No results found for: CHOL, HDL, LDLCALC, LDLDIRECT, TRIG, CHOLHDL  --------------------------------------------------------------------------------------------------  ASSESSMENT AND PLAN: PSVT and palpitations: Symptoms have improved though not completely resolved since our last visit.  Ms. Mcmeekin does not describe any sustained tachycardia.  We discussed the role for pharmacotherapy, though given minimal symptoms and no structural abnormalities by echo, we have agreed to defer this.  I counseled Ms. Dileo on vagal maneuvers to help abort protracted episodes of palpitations.  Follow-up: Return to clinic as needed.  Nelva Bush, MD 10/25/2020 9:17 AM

## 2020-11-06 ENCOUNTER — Ambulatory Visit: Payer: Self-pay | Admitting: Licensed Clinical Social Worker

## 2020-11-06 NOTE — Chronic Care Management (AMB) (Signed)
Care Management Clinical Social Work Note  11/06/2020 Name: Jennifer Hubbard MRN: [id number] DOB: 1961-08-22  Jennifer Hubbard is a 60 y.o. year old female who is a primary care patient of Anitra Lauth, Jodelle Gross, FNP.  The Care Management team was consulted for assistance with chronic disease management and coordination needs.  Engaged with patient by telephone for follow up visit in response to provider referral for social work chronic care management and care coordination services  Consent to Services:  [name] was given information about Care Management services today including:  1. Care Management services includes personalized support from designated clinical staff supervised by her physician, including individualized plan of care and coordination with other care providers 2. 24/7 contact phone numbers for assistance for urgent and routine care needs. 3. The patient may stop case management services at any time by phone call to the office staff.  Patient agreed to services and consent obtained.   Assessment/Interventions: Review of patient past medical history, allergies, medications, and health status, including review of relevant consultants reports was performed today as part of a comprehensive evaluation and provision of chronic care management and care coordination services.  SDOH (Social Determinants of Health) assessments and interventions performed:    Advanced Directives Status: See Care Plan for related entries.  Care Plan  Allergies  Allergen Reactions  . Cephalexin Other (See Comments)    Other reaction(s): Abdominal Pain Other reaction(s): Abdominal Pain   . Erythromycin     nausea  . Latex Other (See Comments)    Unsure if allergic  . Penicillins     "childhood allergy"  . Sulfamethoxazole-Trimethoprim Rash    Outpatient Encounter Medications as of 11/06/2020  Medication Sig  . albuterol (VENTOLIN HFA) 108 (90 Base) MCG/ACT inhaler Inhale into the lungs every 6 (six)  hours as needed for wheezing or shortness of breath.  . budesonide-formoterol (SYMBICORT) 160-4.5 MCG/ACT inhaler Inhale 2 puffs into the lungs 2 (two) times daily.  . chlorhexidine (PERIDEX) 0.12 % solution SMARTSIG:By Mouth  . Cholecalciferol (VITAMIN D3) 1.25 MG (50000 UT) CAPS Take by mouth.  . cycloSPORINE (RESTASIS) 0.05 % ophthalmic emulsion Administer 1 drop to both eyes Two (2) times a day.  . DULoxetine (CYMBALTA) 60 MG capsule Take 1 capsule (60 mg total) by mouth daily.  Marland Kitchen estradiol-norethindrone (ACTIVELLA) 1-0.5 MG tablet Take 1 tablet by mouth daily.  . fluticasone (VERAMYST) 27.5 MCG/SPRAY nasal spray Place 2 sprays into the nose daily.  . montelukast (SINGULAIR) 10 MG tablet Take 10 mg by mouth at bedtime.  . Multiple Vitamin (MULTIVITAMIN) capsule Take 1 capsule by mouth daily.  . NON FORMULARY   . Omega-3 Fatty Acids (FISH OIL) 1000 MG CAPS Take by mouth.  . predniSONE (DELTASONE) 10 MG tablet Take 40 mg (4 tabs) daily for 3 days, then 30 mg (3 tabs) daily for 3 days, then 20 mg (2 tabs) daily for 3 days, then 10 mg (1 tab) daily for 3 days, then stop  . SPIRIVA RESPIMAT 1.25 MCG/ACT AERS SMARTSIG:2 Puff(s) Via Inhaler Daily   No facility-administered encounter medications on file as of 11/06/2020.    Patient Active Problem List   Diagnosis Date Noted  . PSVT (paroxysmal supraventricular tachycardia) (HCC) 10/25/2020  . Palpitations 09/13/2020  . Shortness of breath 09/13/2020  . IBS (irritable bowel syndrome) 09/10/2020  . Encounter to establish care 09/06/2020  . Anxiety 09/06/2020  . Screening for malignant neoplasm of colon 09/06/2020  . Family history of cardiac disorder in mother 09/06/2020  .  Cervical dystonia 09/06/2020  . Bronchiectasis (HCC) 09/06/2020  . COVID-19 10/27/2019  . Dry eye syndrome of both eyes 03/12/2018  . Ground glass opacity present on imaging of lung 12/26/2015  . Pulmonary nodule seen on imaging study 10/13/2013  . Recurrent major  depressive episodes, in full remission (HCC) 06/30/2012  . Status post bariatric surgery 06/30/2012  . 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 Extrapyramidal disease and abnormal movement disorder 10/09/2010    Conditions to be addressed/monitored: Anxiety; Social Isolation and Set designer strain  Patient Care Plan: General Social Work (Adult)    Problem Identified: I'm having a hard time being the sole caregiver for my father     Goal: Find Help in My Community   Start Date: 09/27/2020  This Visit's Progress: On track  Priority: Medium  Note:   Follow Up Date within the next quarter.   - begin a notebook of services in my neighborhood or community - call 211 when I need some help - follow-up on any referrals for help I am given - think ahead to make sure my need does not become an emergency - make a note about what I need to have by the phone or take with me, like an identification card or social security number have a back-up plan - have a back-up plan - make a list of family or friends that I can call  -continue paying out of pocket for caregivers to increase support within the home   Why is this important?    Knowing how and where to find help for yourself or family in your neighborhood and community is an important skill.   You will want to take some steps to learn how.   Active listening utilized, counseling provided, current coping strategies identified, decision-making supported, healthy lifestyle promoted, journaling promoted, meditative movement therapy encouraged, mindfulness encouraged, participation in counseling encouraged, problem-solving facilitated, relaxation techniques promoted, self-reflection promoted, spiritual activities promoted, verbalization of feelings encouraged, affirmation provided, community involvement promoted, expression of thoughts about present/future encouraged, independence in all possible areas promote, life review by  storytelling encouraged, patient strengths promoted, psychosocial concerns monitored, self-expression encouraged, sleep diary encouraged, sleep hygiene techniques encouraged, social relationships promoted, strategies to maintain hearing and/or vision promoted and wellness behaviors promoted  Over the next 120 days, patient/caregiver will work with SW to address concerns related to care coordination needs, lack of a stable support network and lack of Economist. LCSW will assist patient in gaining additional support in order to maintain health and mental health appropriately  Over the next 120 days, patient will demonstrate improved adherence to self care as evidenced by implementing healthy self-care into her daily routine such as: attending all medical appointments, deep breathing exercises, taking time for self-reflection, taking medications as prescribed, drinking water and daily exercise to improve mobility and mood.  Over the next 120 days, patient will work with SW bi-monthly by telephone or in person to reduce or manage symptoms related to stress and anxiety (caregiver strain) Over the next 120 days, patient will demonstrate improved health management independence as evidenced by implementing healthy self-care skills and positive support/resource implementation into their daily routine to help cope with stressors and improve overall health and well-being  Over the next 120 days, patient or caregiver will verbalize basic understanding of depression/stress process and self health management plan as evidenced by her participation in development of long term plan of care and institution of self health management strategies  Notes: Patient  is experiencing ongoing family conflict between siblings in regards to their father's care. Patient is NOT the POA of her father but it the sole caregiver. Her father's POA's are not involved and they are wanting to start paperwork to switch  father's POA over to patient. Elder Law resources provided to patient but family confirm that they already have a Clinical research associate involved.  LCSW discussed coping skills for anxiety and caregiver stress. SW used empathetic and active and reflective listening, validated patient's feelings/concerns, and provided emotional support. LCSW provided self-care examples to help patient manage her stressors and improve her mood.   LCSW sent an extensive list of resources to patient's email on 09/27/20. Resources included: caregiver support groups, caregiver support resources for patients with dementia, elder law attorneys, self-care worksheets, and mental health support within her area.  Update- Patient reports that they were able to find a caregiver for her father from the list CCM LCSW provided.         Follow Up Plan: SW will follow up with patient by phone over the next quarter  Dickie La, [place], MSW, LCSW [facility]  [facility] [email or web address] Phone: [phone]

## 2020-11-20 ENCOUNTER — Ambulatory Visit (INDEPENDENT_AMBULATORY_CARE_PROVIDER_SITE_OTHER): Payer: BC Managed Care – PPO | Admitting: Family Medicine

## 2020-11-20 ENCOUNTER — Other Ambulatory Visit (HOSPITAL_COMMUNITY)
Admission: RE | Admit: 2020-11-20 | Discharge: 2020-11-20 | Disposition: A | Discharge status: Home / Self Care | Payer: BC Managed Care – PPO | Source: Ambulatory Visit | Attending: Family Medicine | Admitting: Family Medicine

## 2020-11-20 ENCOUNTER — Other Ambulatory Visit: Payer: Self-pay

## 2020-11-20 ENCOUNTER — Encounter: Payer: Self-pay | Admitting: Family Medicine

## 2020-11-20 VITALS — BP 126/77 | HR 93 | Temp 98.0°F | Resp 18 | Ht 66.0 in | Wt 150.0 lb

## 2020-11-20 DIAGNOSIS — J479 Bronchiectasis, uncomplicated: Secondary | ICD-10-CM

## 2020-11-20 DIAGNOSIS — K219 Gastro-esophageal reflux disease without esophagitis: Secondary | ICD-10-CM

## 2020-11-20 DIAGNOSIS — S99921S Unspecified injury of right foot, sequela: Secondary | ICD-10-CM

## 2020-11-20 DIAGNOSIS — Z0000 Encounter for general adult medical examination without abnormal findings: Secondary | ICD-10-CM

## 2020-11-20 DIAGNOSIS — Z13820 Encounter for screening for osteoporosis: Secondary | ICD-10-CM | POA: Diagnosis not present

## 2020-11-20 DIAGNOSIS — N760 Acute vaginitis: Secondary | ICD-10-CM | POA: Insufficient documentation

## 2020-11-20 DIAGNOSIS — Z124 Encounter for screening for malignant neoplasm of cervix: Secondary | ICD-10-CM | POA: Diagnosis present

## 2020-11-20 DIAGNOSIS — Z1211 Encounter for screening for malignant neoplasm of colon: Secondary | ICD-10-CM

## 2020-11-20 DIAGNOSIS — B370 Candidal stomatitis: Secondary | ICD-10-CM

## 2020-11-20 DIAGNOSIS — S99921A Unspecified injury of right foot, initial encounter: Secondary | ICD-10-CM | POA: Insufficient documentation

## 2020-11-20 MED ORDER — FLUCONAZOLE 150 MG PO TABS: ORAL_TABLET | ORAL | 0 refills | Status: DC

## 2020-11-20 MED ORDER — OMEPRAZOLE 40 MG PO CPDR: 40.0000 mg | DELAYED_RELEASE_CAPSULE | Freq: Every day | ORAL | 1 refills | Status: DC

## 2020-11-20 NOTE — Progress Notes (Signed)
Subjective:    Patient ID: Jennifer Hubbard, female    DOB: 1961-10-02, 60 y.o.   MRN: [id number]  Jennifer Hubbard is a 60 y.o. female presenting on 11/20/2020 for Annual Exam   HPI   HEALTH MAINTENANCE:  Weight/BMI: Normal, BMI 24.21% Diet: Regular Seatbelt: Yes Sunscreen: As needed PAP: Due, completed today Mammogram: Completed 09/24/2020  DEXA: Due, ordered Colon cancer screening: Cologuard, ordered Optometry: Regularly Dentistry: Regularly  IMMUNIZATIONS: Tetanus: Up to date, 10/04/2023 Influenza: Up to date, 07/30/2020 COVID: Up to date, Pfizer  Depression screen [facility] 2/9 09/06/2020  Decreased Interest 0  Down, Depressed, Hopeless 0  PHQ - 2 Score 0  Altered sleeping 0  Tired, decreased energy 0  Change in appetite 0  Feeling bad or failure about yourself  0  Trouble concentrating 0  Moving slowly or fidgety/restless 0  Suicidal thoughts 0  PHQ-9 Score 0    Past Medical History:  Diagnosis Date  . Allergy   . Anemia   . Asthma   . Bronchiectasis (HCC)   . Cervical dystonia   . Depression   . IBS (irritable bowel syndrome) 09/10/2020  . Sleep apnea    Past Surgical History:  Procedure Laterality Date  . BREAST CYST ASPIRATION Left    neg  . ENDOSCOPIC CONCHA BULLOSA RESECTION Right 06/07/2020   Procedure: ENDOSCOPIC CONCHA BULLOSA RESECTION;  Surgeon: Vernie Murders, MD;  Location: [facility];  Service: ENT;  Laterality: Right;  . EYE SURGERY    . GASTRIC BYPASS    . LAPAROSCOPIC OOPHERECTOMY    . NASAL SEPTOPLASTY W/ TURBINOPLASTY Bilateral 06/07/2020   Procedure: NASAL SEPTOPLASTY WITH INFERIOR TURBINATE REDUCTION;  Surgeon: Vernie Murders, MD;  Location: [facility];  Service: ENT;  Laterality: Bilateral;   Social History   Socioeconomic History  . Marital status: Unknown    Spouse name: Not on file  . Number of children: Not on file  . Years of education: Not on file  . Highest education level: Not on file  Occupational History   . Not on file  Tobacco Use  . Smoking status: Never Smoker  . Smokeless tobacco: Never Used  Vaping Use  . Vaping Use: Never used  Substance and Sexual Activity  . Alcohol use: Never  . Drug use: Never  . Sexual activity: Yes    Birth control/protection: None    Comment: Married  Other Topics Concern  . Not on file  Social History Narrative  . Not on file   Social Determinants of Health   Financial Resource Strain: Not on file  Food Insecurity: Not on file  Transportation Needs: Not on file  Physical Activity: Not on file  Stress: Not on file  Social Connections: Not on file  Intimate Partner Violence: Not on file   Family History  Problem Relation Age of Onset  . Atrial fibrillation Mother   . Heart disease Mother   . Lung cancer Father   . Stroke Maternal Grandmother   . Breast cancer Maternal Aunt   . Breast cancer Paternal Grandmother    Current Outpatient Medications on File Prior to Visit  Medication Sig  . albuterol (VENTOLIN HFA) 108 (90 Base) MCG/ACT inhaler Inhale into the lungs every 6 (six) hours as needed for wheezing or shortness of breath.  . budesonide-formoterol (SYMBICORT) 160-4.5 MCG/ACT inhaler Inhale 2 puffs into the lungs 2 (two) times daily.  . chlorhexidine (PERIDEX) 0.12 % solution SMARTSIG:By Mouth  . Cholecalciferol (VITAMIN D3) 1.25 MG (50000 UT)  CAPS Take by mouth.  . cycloSPORINE (RESTASIS) 0.05 % ophthalmic emulsion Administer 1 drop to both eyes Two (2) times a day.  . DULoxetine (CYMBALTA) 60 MG capsule Take 1 capsule (60 mg total) by mouth daily.  Marland Kitchen. estradiol-norethindrone (ACTIVELLA) 1-0.5 MG tablet Take 1 tablet by mouth daily.  . fluticasone (VERAMYST) 27.5 MCG/SPRAY nasal spray Place 2 sprays into the nose daily.  . montelukast (SINGULAIR) 10 MG tablet Take 10 mg by mouth at bedtime.  . Multiple Vitamin (MULTIVITAMIN) capsule Take 1 capsule by mouth daily.  . NON FORMULARY   . Omega-3 Fatty Acids (FISH OIL) 1000 MG CAPS Take by  mouth.  . SPIRIVA RESPIMAT 1.25 MCG/ACT AERS SMARTSIG:2 Puff(s) Via Inhaler Daily   No current facility-administered medications on file prior to visit.    Per HPI unless specifically indicated above     Objective:    BP 126/77 (BP Location: Right Arm, Patient Position: Sitting, Cuff Size: Normal)   Pulse 93   Temp 98 F (36.7 C) (Temporal)   Resp 18   Ht 5\' 6"  (1.676 m)   Wt 150 lb (68 kg)   SpO2 100%   BMI 24.21 kg/m   Wt Readings from Last 3 Encounters:  11/20/20 150 lb (68 kg)  10/25/20 152 lb (68.9 kg)  09/12/20 151 lb (68.5 kg)    Physical Exam Vitals and nursing note reviewed.  Constitutional:      General: Jennifer Hubbard is not in acute distress.    Appearance: Normal appearance. Jennifer Hubbard is well-developed, well-groomed and normal weight. Jennifer Hubbard is not ill-appearing or toxic-appearing.  HENT:     Head: Normocephalic and atraumatic.     Right Ear: Tympanic membrane, ear canal and external ear normal. There is no impacted cerumen.     Left Ear: Tympanic membrane, ear canal and external ear normal. There is no impacted cerumen.     Nose: Nose normal. No congestion or rhinorrhea.     Comments: Lesia SagoFacemask is in place, covering mouth and nose.    Mouth/Throat:     Lips: Pink.     Mouth: Mucous membranes are moist. Oral lesions present.     Pharynx: Oropharynx is clear. Uvula midline. No oropharyngeal exudate or posterior oropharyngeal erythema.     Comments: Oral thrush noted mid line on tongue Eyes:     General: Lids are normal. Vision grossly intact. No scleral icterus.       Right eye: No discharge.        Left eye: No discharge.     Extraocular Movements: Extraocular movements intact.     Conjunctiva/sclera: Conjunctivae normal.     Pupils: Pupils are equal, round, and reactive to light.  Neck:     Thyroid: No thyroid mass or thyromegaly.  Cardiovascular:     Rate and Rhythm: Normal rate and regular rhythm.     Pulses: Normal pulses.          Dorsalis pedis pulses are 2+ on the  right side and 2+ on the left side.     Heart sounds: Normal heart sounds. No murmur heard. No friction rub. No gallop.   Pulmonary:     Effort: Pulmonary effort is normal. No respiratory distress.     Breath sounds: Normal breath sounds.  Abdominal:     General: Abdomen is flat. Bowel sounds are normal. There is no distension.     Palpations: Abdomen is soft. There is no hepatomegaly, splenomegaly or mass.     Tenderness: There is no  abdominal tenderness. There is no guarding or rebound.     Hernia: No hernia is present. There is no hernia in the left inguinal area or right inguinal area.  Genitourinary:    General: Normal vulva.     Exam position: Lithotomy position.     Pubic Area: No rash or pubic lice.      Labia:        Right: No rash, tenderness, lesion or injury.        Left: No rash, tenderness, lesion or injury.      Urethra: No urethral pain, urethral swelling or urethral lesion.     Vagina: Normal. No signs of injury and foreign body. No vaginal discharge, erythema, tenderness, bleeding or lesions.     Cervix: No cervical motion tenderness, discharge, friability, lesion, erythema, cervical bleeding or eversion.     Uterus: Normal. Not enlarged and not tender.      Adnexa: Right adnexa normal and left adnexa normal.  Musculoskeletal:        General: Normal range of motion.     Cervical back: Normal range of motion and neck supple. No tenderness.     Right lower leg: No edema.     Left lower leg: No edema.     Comments: Normal tone, strength 5/5 BUE & BLE  Feet:     Right foot:     Skin integrity: Skin integrity normal.     Left foot:     Skin integrity: Skin integrity normal.  Lymphadenopathy:     Cervical: No cervical adenopathy.     Lower Body: No right inguinal adenopathy. No left inguinal adenopathy.  Skin:    General: Skin is warm and dry.     Capillary Refill: Capillary refill takes less than 2 seconds.  Neurological:     General: No focal deficit present.      Mental Status: Jennifer Hubbard is alert and oriented to person, place, and time.     Cranial Nerves: No cranial nerve deficit.     Sensory: No sensory deficit.     Motor: No weakness.     Coordination: Coordination normal.     Gait: Gait normal.     Deep Tendon Reflexes: Reflexes normal.  Psychiatric:        Attention and Perception: Attention and perception normal.        Mood and Affect: Mood and affect normal.        Speech: Speech normal.        Behavior: Behavior normal. Behavior is cooperative.        Thought Content: Thought content normal.        Cognition and Memory: Cognition and memory normal.        Judgment: Judgment normal.     Results for orders placed or performed in visit on 10/04/20  ECHOCARDIOGRAM COMPLETE  Result Value Ref Range   S' Lateral 3.10 cm   Area-P 1/2 5.66 cm2      Assessment & Plan:   Problem List Items Addressed This Visit      Respiratory   Bronchiectasis (HCC)    Stable and well controlled, follows with [facility].  Has been out on disability due to being unable to wear a mask.  Reports will need an update on her long term disability paperwork likely in May 2022, due for August 2022.  Reports Jennifer Hubbard will be speaking to her pulmonologist and requesting they complete the paperwork, but if they decline, would need this completed through  our office.        Digestive   Gastroesophageal reflux disease    Currently being treated with omeprazole 40mg  daily.  Will send in refill to pharmacy on file.      Relevant Medications   omeprazole (PRILOSEC) 40 MG capsule   Thrush, oral    Likely secondary to having to wear a mask, which Jennifer Hubbard is medically unable to do so with her history of Bronchiectasis.  Will treat with diflucan 150mg  today and repeat dosing in 72 hours.  RTC PRN      Relevant Medications   fluconazole (DIFLUCAN) 150 MG tablet     Musculoskeletal and Integument   Injury of toenail of right foot    Right foot great toenail with break  throughout.  Reports has been present for > 20 years.  Requesting referral to podiatry.  Referral placed.      Relevant Orders   Ambulatory referral to Podiatry     Other   Annual physical exam - Primary    Annual physical exam.  Plan: 1. Obtain health maintenance screenings as above according to age. - Increase physical activity to 30 minutes most days of the week.  - Eat healthy diet high in vegetables and fruits; low in refined carbohydrates. - Screening labs and tests as ordered 2. Return 1 year for annual physical.       Relevant Orders   CBC with Differential   COMPLETE METABOLIC PANEL WITH GFR   TSH + free T4   Lipid Profile   VITAMIN D 25 Hydroxy (Vit-D Deficiency, Fractures)   Screening for colon cancer    Pt requiring colon cancer screening.  Denies family history of colon cancer.  Plan: - Discussed timing for initiation of colon cancer screening ACS vs USPSTF guidelines - Mutual decision making discussion for options of colonoscopy vs cologuard.  Pt prefers cologuard. - Ordered Cologuard today       Relevant Orders   Cologuard   Screening for cervical cancer    Last PAP testing not on file.  Due for PAP testing.  Plan: 1. PAP testing completed and sent to lab for evaluation       Relevant Orders   Cytology - PAP   Screening for osteoporosis    Pt postmenopausal with history of prior DEXA scan, no results available on file to review.  Plan: 1. Obtain DG bone density.         Relevant Orders   DG Bone Density      Meds ordered this encounter  Medications  . fluconazole (DIFLUCAN) 150 MG tablet    Sig: Take 1 tablet now and repeat dose in 72 hours if having continued symptoms.    Dispense:  2 tablet    Refill:  0  . omeprazole (PRILOSEC) 40 MG capsule    Sig: Take 1 capsule (40 mg total) by mouth daily.    Dispense:  90 capsule    Refill:  1   Follow up plan: Return in about 1 year (around 11/20/2021) for CPE.  ,  FNP-C Family Nurse Practitioner [facility] 11/20/2020, 12:09 PM

## 2020-11-20 NOTE — Assessment & Plan Note (Addendum)
Stable and well controlled, follows with [facility].  Has been out on disability due to being unable to wear a mask.  Reports will need an update on her long term disability paperwork likely in May 2022, due for August 2022.  Reports she will be speaking to her pulmonologist and requesting they complete the paperwork, but if they decline, would need this completed through our office.

## 2020-11-20 NOTE — Assessment & Plan Note (Signed)
Right foot great toenail with break throughout.  Reports has been present for > 20 years.  Requesting referral to podiatry.  Referral placed.

## 2020-11-20 NOTE — Assessment & Plan Note (Signed)
Annual physical exam  Plan: 1. Obtain health maintenance screenings as above according to age. - Increase physical activity to 30 minutes most days of the week.  - Eat healthy diet high in vegetables and fruits; low in refined carbohydrates. - Screening labs and tests as ordered 2. Return 1 year for annual physical.  

## 2020-11-20 NOTE — Assessment & Plan Note (Signed)
Pt postmenopausal with history of prior DEXA scan, no results available on file to review.  Plan: 1. Obtain DG bone density.

## 2020-11-20 NOTE — Assessment & Plan Note (Signed)
Last PAP testing not on file.  Due for PAP testing.  Plan: 1. PAP testing completed and sent to lab for evaluation  

## 2020-11-20 NOTE — Patient Instructions (Signed)
We have sent your PAP to the lab for testing.  Will contact you once the results are received.  Well Visit: Care Instructions Overview  Well visits can help you stay healthy. Your provider has checked your overall health and may have suggested ways to take good care of yourself. Your provider also may have recommended tests. At home, you can help prevent illness with healthy eating, regular exercise, and other steps.  Follow-up care is a key part of your treatment and safety. Be sure to make and go to all appointments, and call your provider if you are having problems. It's also a good idea to know your test results and keep a list of the medicines you take.  How can you care for yourself at home?   Get screening tests that you and your doctor decide on. Screening helps find diseases before any symptoms appear.   Eat healthy foods. Choose fruits, vegetables, whole grains, protein, and low-fat dairy foods. Limit fat, especially saturated fat. Reduce salt in your diet.   Limit alcohol. If you are a man, have no more than 2 drinks a Elenora Hawbaker or 14 drinks a week. If you are a woman, have no more than 1 drink a Ashlyne Olenick or 7 drinks a week.   Get at least 30 minutes of physical activity on most days of the week.  We recommend you go no more than 2 days in a row without exercise. Walking is a good choice. You also may want to do other activities, such as running, swimming, cycling, or playing tennis or team sports. Discuss any changes in your exercise program with your provider.   Reach and stay at a healthy weight. This will lower your risk for many problems, such as obesity, diabetes, heart disease, and high blood pressure.   Do not smoke or allow others to smoke around you. If you need help quitting, talk to your provider about stop-smoking programs and medicines. These can increase your chances of quitting for good.  Can call 1-800-QUIT-NOW (352-247-5758) for the [facility], assistance  with smoking cessation.   Care for your mental health. It is easy to get weighed down by worry and stress. Learn strategies to manage stress, like deep breathing and mindfulness, and stay connected with your family and community. If you find you often feel sad or hopeless, talk with your provider. Treatment can help.   Talk to your provider about whether you have any risk factors for sexually transmitted infections (STIs). You can help prevent STIs if you wait to have sex with a new partner (or partners) until you've each been tested for STIs. It also helps if you use condoms (female or female condoms) and if you limit your sex partners to one person who only has sex with you. Vaccines are available for some STIs, such as HPV (these are age dependent).   If you think you may have a problem with alcohol or drug use, talk to your provider. This includes prescription medicines (such as amphetamines and opioids) and illegal drugs (such as cocaine and methamphetamine). Your provider can help you figure out what type of treatment is best for you.   If you have concerns about domestic violence or intimate partner violence, there are resources available to you. National Domestic Abuse Hotline 931-626-9749   Protect your skin from too much sun. When you're outdoors from 10 a.m. to 4 p.m., stay in the shade or cover up with clothing and a hat with a wide  brim. Wear sunglasses that block UV rays. Even when it's cloudy, put broad-spectrum sunscreen (SPF 30 or higher) on any exposed skin.   See a dentist one or two times a year for checkups and to have your teeth cleaned.   See an eye doctor once per year for an eye exam.   Wear a seat belt in the car.  When should you call for help?  Watch closely for changes in your health, and be sure to contact your provider if you have any problems or symptoms that concern you.  We will plan to see you back in 12 months for your physical  You will receive a  survey after today's visit either digitally by e-mail or paper by USPS mail. Your experiences and feedback matter to [place].  Please respond so we know how we are doing as we provide care for you.  Call us with any questions/concerns/needs.  It is my goal to be available to you for your health concerns.  Thanks for choosing me to be a partner in your healthcare needs!  Charlaine Dalton, FNP-C Family Nurse Practitioner [facility] Phone: [phone]

## 2020-11-20 NOTE — Assessment & Plan Note (Signed)
Pt requiring colon cancer screening.  Denies family history of colon cancer.  Plan: - Discussed timing for initiation of colon cancer screening ACS vs USPSTF guidelines - Mutual decision making discussion for options of colonoscopy vs cologuard.  Pt prefers cologuard. - Ordered Cologuard today 

## 2020-11-20 NOTE — Assessment & Plan Note (Signed)
Likely secondary to having to wear a mask, which she is medically unable to do so with her history of Bronchiectasis.  Will treat with diflucan 150mg  today and repeat dosing in 72 hours.  RTC PRN

## 2020-11-20 NOTE — Assessment & Plan Note (Signed)
Currently being treated with omeprazole 40mg  daily.  Will send in refill to pharmacy on file.

## 2020-11-21 LAB — CBC WITH DIFFERENTIAL/PLATELET
Absolute Monocytes: 393 cells/uL (ref 200–950)
Basophils Absolute: 41 cells/uL (ref 0–200)
Basophils Relative: 0.8 %
Eosinophils Absolute: 92 cells/uL (ref 15–500)
Eosinophils Relative: 1.8 %
HCT: 40.5 % (ref 35.0–45.0)
Hemoglobin: 13.7 g/dL (ref 11.7–15.5)
Lymphs Abs: 1086 cells/uL (ref 850–3900)
MCH: 30.1 pg (ref 27.0–33.0)
MCHC: 33.8 g/dL (ref 32.0–36.0)
MCV: 89 fL (ref 80.0–100.0)
MPV: 11.1 fL (ref 7.5–12.5)
Monocytes Relative: 7.7 %
Neutro Abs: 3488 cells/uL (ref 1500–7800)
Neutrophils Relative %: 68.4 %
Platelets: 237 10*3/uL (ref 140–400)
RBC: 4.55 10*6/uL (ref 3.80–5.10)
RDW: 13.1 % (ref 11.0–15.0)
Total Lymphocyte: 21.3 %
WBC: 5.1 10*3/uL (ref 3.8–10.8)

## 2020-11-21 LAB — COMPLETE METABOLIC PANEL WITH GFR
AG Ratio: 1.8 (calc) (ref 1.0–2.5)
ALT: 13 U/L (ref 6–29)
AST: 19 U/L (ref 10–35)
Albumin: 4.3 g/dL (ref 3.6–5.1)
Alkaline phosphatase (APISO): 53 U/L (ref 37–153)
BUN: 10 mg/dL (ref 7–25)
CO2: 23 mmol/L (ref 20–32)
Calcium: 9.1 mg/dL (ref 8.6–10.4)
Chloride: 107 mmol/L (ref 98–110)
Creat: 0.87 mg/dL (ref 0.50–1.05)
GFR, Est African American: 86 mL/min/{1.73_m2} (ref >=60)
GFR, Est Non African American: 74 mL/min/{1.73_m2} (ref >=60)
Globulin: 2.4 g/dL (calc) (ref 1.9–3.7)
Glucose, Bld: 77 mg/dL (ref 65–99)
Potassium: 3.7 mmol/L (ref 3.5–5.3)
Sodium: 140 mmol/L (ref 135–146)
Total Bilirubin: 0.8 mg/dL (ref 0.2–1.2)
Total Protein: 6.7 g/dL (ref 6.1–8.1)

## 2020-11-21 LAB — LIPID PANEL
Cholesterol: 186 mg/dL (ref <200)
HDL: 53 mg/dL (ref >=50)
LDL Cholesterol (Calc): 118 mg/dL (calc) — ABNORMAL HIGH
Non-HDL Cholesterol (Calc): 133 mg/dL (calc) — ABNORMAL HIGH (ref <130)
Total CHOL/HDL Ratio: 3.5 (calc) (ref <5.0)
Triglycerides: 62 mg/dL (ref <150)

## 2020-11-21 LAB — TSH+FREE T4: TSH W/REFLEX TO FT4: 2.31 mIU/L (ref 0.40–4.50)

## 2020-11-21 LAB — VITAMIN D 25 HYDROXY (VIT D DEFICIENCY, FRACTURES): Vit D, 25-Hydroxy: 53 ng/mL (ref 30–100)

## 2020-11-26 LAB — CYTOLOGY - PAP
Adequacy: ABSENT
Comment: NEGATIVE
Diagnosis: UNDETERMINED — AB
High risk HPV: NEGATIVE

## 2020-11-27 ENCOUNTER — Telehealth: Payer: Self-pay | Admitting: Family Medicine

## 2020-11-27 ENCOUNTER — Encounter: Payer: BC Managed Care – PPO | Admitting: Family Medicine

## 2020-11-27 MED ORDER — FLUCONAZOLE 150 MG PO TABS: 150.0000 mg | ORAL_TABLET | Freq: Once | ORAL | 0 refills | Status: AC

## 2020-11-27 NOTE — Telephone Encounter (Signed)
Copied from CRM [id number]. Topic: Quick Communication - Rx Refill/Question >> Nov 27, 2020 11:20 AM Cuthrell, Pearlean Brownie wrote: Medication:fluconazole (DIFLUCAN) 150 MG tablet [[id number]  Has the patient contacted their pharmacy? No there were no refills on this medication    Preferred Pharmacy (with phone number or street name): [facility] - [place], [place] - [address] [place] [address] Phone: [phone] Fax: [phone]   Agent: Please be advised that RX refills may take up to 3 business days. We ask that you follow-up with your pharmacy.  Patient stated that her thrush isn't completely cleared up and she would like another dosage

## 2020-11-29 ENCOUNTER — Ambulatory Visit: Payer: BC Managed Care – PPO | Admitting: Podiatry

## 2020-11-29 ENCOUNTER — Encounter: Payer: Self-pay | Admitting: Podiatry

## 2020-11-29 ENCOUNTER — Other Ambulatory Visit: Payer: Self-pay

## 2020-11-29 DIAGNOSIS — L603 Nail dystrophy: Secondary | ICD-10-CM

## 2020-11-29 DIAGNOSIS — Q666 Other congenital valgus deformities of feet: Secondary | ICD-10-CM

## 2020-11-29 NOTE — Progress Notes (Signed)
Subjective:  Patient ID: Jennifer Hubbard, female    DOB: 11-03-1960,  MRN: [id number]  Chief Complaint  Patient presents with  . Nail Problem    Crack in nail and discuss orthotics    60 y.o. female presents with the above complaint.  Patient presents with primary complaint of pes planovalgus and would like to discuss orthotics.  She has been wearing orthotics for most of her life but now there is cracking in the orthotics and would like to discuss about replacing them.  She states that she had this made about 4 5 years ago and has progressively worked really well however now they are starting to worn out.  She denies any other acute complaints.  She also has secondary questions or concerns about crack in the nail and wanted to discuss if there are any treatment options that were available.  She had trauma to the nail very long time ago and has been going on since then.   Review of Systems: Negative except as noted in the HPI. Denies N/V/F/Ch.  Past Medical History:  Diagnosis Date  . Allergy   . Anemia   . Asthma   . Bronchiectasis (HCC)   . Cervical dystonia   . Depression   . IBS (irritable bowel syndrome) 09/10/2020  . Sleep apnea     Current Outpatient Medications:  .  albuterol (VENTOLIN HFA) 108 (90 Base) MCG/ACT inhaler, Inhale into the lungs every 6 (six) hours as needed for wheezing or shortness of breath., Disp: , Rfl:  .  budesonide-formoterol (SYMBICORT) 160-4.5 MCG/ACT inhaler, Inhale 2 puffs into the lungs 2 (two) times daily., Disp: , Rfl:  .  chlorhexidine (PERIDEX) 0.12 % solution, SMARTSIG:By Mouth, Disp: , Rfl:  .  Cholecalciferol (VITAMIN D3) 1.25 MG (50000 UT) CAPS, Take by mouth., Disp: , Rfl:  .  cycloSPORINE (RESTASIS) 0.05 % ophthalmic emulsion, Administer 1 drop to both eyes Two (2) times a day., Disp: , Rfl:  .  DULoxetine (CYMBALTA) 60 MG capsule, Take 1 capsule (60 mg total) by mouth daily., Disp: 90 capsule, Rfl: 1 .  estradiol-norethindrone (ACTIVELLA)  1-0.5 MG tablet, Take 1 tablet by mouth daily., Disp: 90 tablet, Rfl: 1 .  fluconazole (DIFLUCAN) 150 MG tablet, Take 1 tablet now and repeat dose in 72 hours if having continued symptoms., Disp: 2 tablet, Rfl: 0 .  fluticasone (VERAMYST) 27.5 MCG/SPRAY nasal spray, Place 2 sprays into the nose daily., Disp: , Rfl:  .  montelukast (SINGULAIR) 10 MG tablet, Take 10 mg by mouth at bedtime., Disp: , Rfl:  .  Multiple Vitamin (MULTIVITAMIN) capsule, Take 1 capsule by mouth daily., Disp: , Rfl:  .  NON FORMULARY, , Disp: , Rfl:  .  Omega-3 Fatty Acids (FISH OIL) 1000 MG CAPS, Take by mouth., Disp: , Rfl:  .  omeprazole (PRILOSEC) 40 MG capsule, Take 1 capsule (40 mg total) by mouth daily., Disp: 90 capsule, Rfl: 1 .  SPIRIVA RESPIMAT 1.25 MCG/ACT AERS, SMARTSIG:2 Puff(s) Via Inhaler Daily, Disp: , Rfl:   Social History   Tobacco Use  Smoking Status Never Smoker  Smokeless Tobacco Never Used    Allergies  Allergen Reactions  . Cephalexin Other (See Comments)    Other reaction(s): Abdominal Pain Other reaction(s): Abdominal Pain   . Erythromycin     nausea  . Latex Other (See Comments)    Unsure if allergic  . Penicillins     "childhood allergy"  . Sulfamethoxazole-Trimethoprim Rash   Objective:  There were no  vitals filed for this visit. There is no height or weight on file to calculate BMI. Constitutional Well developed. Well nourished.  Vascular Dorsalis pedis pulses palpable bilaterally. Posterior tibial pulses palpable bilaterally. Capillary refill normal to all digits.  No cyanosis or clubbing noted. Pedal hair growth normal.  Neurologic Normal speech. Oriented to person, place, and time. Epicritic sensation to light touch grossly present bilaterally.  Dermatologic  longitudinal cracking noted to the nail of the right hallux.  Appears to be previous signs of trauma damage.  No hematoma noted no concern for wound noted.  The nail is well attached to the underlying nail  bed No open wounds. No skin lesions.  Orthopedic:  Gait examination shows pes planovalgus semiflexible in nature.  Calcaneovalgus noted to many toe signs partially able to recruit the arch with dorsiflexion of the hallux.  Patient is able to perform single and double heel raises   Radiographs: None Assessment:   1. Nail dystrophy   2. Pes planovalgus    Plan:  Patient was evaluated and treated and all questions answered.  Bilateral pes planovalgus semiflexible -I explained the patient the etiology of pes planovalgus and various treatment options were extensively discussed.  Given that her current orthotics are wearing down I believe she will benefit from either refurbishing the current orthotics or replacing them completely.  This will help prevent any type of arch pain as well as support the flatfoot bilaterally.  I discussed this with the patient extensive detail.  She states understanding. -She will be scheduled see rec for custom-made orthotics  Right hallux mild dystrophic nail -I explained the patient the etiology of dystrophy of the nail and various treatment options were discussed.  Given that this may have happened from previous trauma at this time patient does not need any acute intervention at it is very mild in nature.  She states understanding   No follow-ups on file.

## 2020-12-07 LAB — COLOGUARD: Cologuard: NEGATIVE

## 2020-12-11 ENCOUNTER — Ambulatory Visit: Payer: Self-pay | Admitting: Licensed Clinical Social Worker

## 2020-12-11 NOTE — Chronic Care Management (AMB) (Signed)
Care Management Clinical Social Work Note  12/11/2020 Name: Heddy Vidana MRN: [id number] DOB: Jun 29, 1961  Lilac Hoff is a 60 y.o. year old female who is a primary care patient of Anitra Lauth, Jodelle Gross, FNP.  The Care Management team was consulted for assistance with chronic disease management and coordination needs.  Engaged with patient by telephone for follow up visit in response to provider referral for social work chronic care management and care coordination services  Consent to Services:  Ms. Meraz was given information about Care Management services today including:  1. Care Management services includes personalized support from designated clinical staff supervised by her physician, including individualized plan of care and coordination with other care providers 2. 24/7 contact phone numbers for assistance for urgent and routine care needs. 3. The patient may stop case management services at any time by phone call to the office staff.  Patient agreed to services and consent obtained.   Assessment: Review of patient past medical history, allergies, medications, and health status, including review of relevant consultants reports was performed today as part of a comprehensive evaluation and provision of chronic care management and care coordination services.  SDOH (Social Determinants of Health) assessments and interventions performed:    Advanced Directives Status: Not addressed in this encounter.  Care Plan  Allergies  Allergen Reactions  . Cephalexin Other (See Comments)    Other reaction(s): Abdominal Pain Other reaction(s): Abdominal Pain   . Erythromycin     nausea  . Latex Other (See Comments)    Unsure if allergic  . Penicillins     "childhood allergy"  . Sulfamethoxazole-Trimethoprim Rash    Outpatient Encounter Medications as of 12/11/2020  Medication Sig  . albuterol (VENTOLIN HFA) 108 (90 Base) MCG/ACT inhaler Inhale into the lungs every 6 (six) hours as needed  for wheezing or shortness of breath.  . budesonide-formoterol (SYMBICORT) 160-4.5 MCG/ACT inhaler Inhale 2 puffs into the lungs 2 (two) times daily.  . chlorhexidine (PERIDEX) 0.12 % solution SMARTSIG:By Mouth  . Cholecalciferol (VITAMIN D3) 1.25 MG (50000 UT) CAPS Take by mouth.  . cycloSPORINE (RESTASIS) 0.05 % ophthalmic emulsion Administer 1 drop to both eyes Two (2) times a day.  . DULoxetine (CYMBALTA) 60 MG capsule Take 1 capsule (60 mg total) by mouth daily.  Marland Kitchen estradiol-norethindrone (ACTIVELLA) 1-0.5 MG tablet Take 1 tablet by mouth daily.  . fluconazole (DIFLUCAN) 150 MG tablet Take 1 tablet now and repeat dose in 72 hours if having continued symptoms.  . fluticasone (VERAMYST) 27.5 MCG/SPRAY nasal spray Place 2 sprays into the nose daily.  . montelukast (SINGULAIR) 10 MG tablet Take 10 mg by mouth at bedtime.  . Multiple Vitamin (MULTIVITAMIN) capsule Take 1 capsule by mouth daily.  . NON FORMULARY   . Omega-3 Fatty Acids (FISH OIL) 1000 MG CAPS Take by mouth.  Marland Kitchen omeprazole (PRILOSEC) 40 MG capsule Take 1 capsule (40 mg total) by mouth daily.  Marland Kitchen SPIRIVA RESPIMAT 1.25 MCG/ACT AERS SMARTSIG:2 Puff(s) Via Inhaler Daily   No facility-administered encounter medications on file as of 12/11/2020.    Patient Active Problem List   Diagnosis Date Noted  . Annual physical exam 11/20/2020  . Screening for colon cancer 11/20/2020  . Screening for cervical cancer 11/20/2020  . Gastroesophageal reflux disease 11/20/2020  . Injury of toenail of right foot 11/20/2020  . Screening for osteoporosis 11/20/2020  . Acute vaginitis 11/20/2020  . Thrush, oral 11/20/2020  . PSVT (paroxysmal supraventricular tachycardia) (HCC) 10/25/2020  . Palpitations 09/13/2020  .  Shortness of breath 09/13/2020  . IBS (irritable bowel syndrome) 09/10/2020  . Encounter to establish care 09/06/2020  . Anxiety 09/06/2020  . Screening for malignant neoplasm of colon 09/06/2020  . Family history of cardiac  disorder in mother 09/06/2020  . Cervical dystonia 09/06/2020  . Bronchiectasis (HCC) 09/06/2020  . COVID-19 10/27/2019  . Dry eye syndrome of both eyes 03/12/2018  . Ground glass opacity present on imaging of lung 12/26/2015  . Pulmonary nodule seen on imaging study 10/13/2013  . Recurrent major depressive episodes, in full remission (HCC) 06/30/2012  . Status post bariatric surgery 06/30/2012  . 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 Extrapyramidal disease and abnormal movement disorder 10/09/2010    Care Plan : General Social Work (Adult)  Updates made by Gustavus Bryant, LCSW since 12/11/2020 12:00 AM    Problem: I'm having a hard time being the sole caregiver for my father     Long-Range Goal: Find Help in My Community   Start Date: 12/11/2020  Recent Progress: On track  Priority: Medium  Note:   Follow Up Date within the next quarter.   Timeframe:  Long-Range Goal Priority:  Medium  Start Date:  12/11/20                      Expected End Date:  03/10/21                   Follow Up Date- 02/05/21  - begin a notebook of services in my neighborhood or community - call 211 when I need some help - follow-up on any referrals for help I am given - think ahead to make sure my need does not become an emergency - make a note about what I need to have by the phone or take with me, like an identification card or social security number have a back-up plan - have a back-up plan - make a list of family or friends that I can call  -continue paying out of pocket for caregivers to increase support within the home   Why is this important?    Knowing how and where to find help for yourself or family in your neighborhood and community is an important skill.   You will want to take some steps to learn how.   Active listening utilized, counseling provided, current coping strategies identified, decision-making supported, healthy lifestyle promoted, journaling promoted,  meditative movement therapy encouraged, mindfulness encouraged, participation in counseling encouraged, problem-solving facilitated, relaxation techniques promoted, self-reflection promoted, spiritual activities promoted, verbalization of feelings encouraged, affirmation provided, community involvement promoted, expression of thoughts about present/future encouraged, independence in all possible areas promote, life review by storytelling encouraged, patient strengths promoted, psychosocial concerns monitored, self-expression encouraged, sleep diary encouraged, sleep hygiene techniques encouraged, social relationships promoted, strategies to maintain hearing and/or vision promoted and wellness behaviors promoted  Over the next 120 days, patient/caregiver will work with SW to address concerns related to care coordination needs, lack of a stable support network and lack of Economist. LCSW will assist patient in gaining additional support in order to maintain health and mental health appropriately  Over the next 120 days, patient will demonstrate improved adherence to self care as evidenced by implementing healthy self-care into her daily routine such as: attending all medical appointments, deep breathing exercises, taking time for self-reflection, taking medications as prescribed, drinking water and daily exercise to improve mobility and mood.  Over the next 120 days, patient  will work with SW bi-monthly by telephone or in person to reduce or manage symptoms related to stress and anxiety (caregiver strain) Over the next 120 days, patient will demonstrate improved health management independence as evidenced by implementing healthy self-care skills and positive support/resource implementation into their daily routine to help cope with stressors and improve overall health and well-being  Over the next 120 days, patient or caregiver will verbalize basic understanding of depression/stress  process and self health management plan as evidenced by her participation in development of long term plan of care and institution of self health management strategies  Current Barriers:  . Limited social support, Mental Health Concerns , and Limited access to caregiver . Suicidal Ideation/Homicidal Ideation: No  Interventions: . Patient interviewed and appropriate assessments performed: brief mental health assessment . Patient wishes that CCM LCSW not document what we discussed today during our session regarding her mental health. However, patient is doing well and her father's health has improved. They are having a family gathering this weekend to celebrate her father and patient is looking forward to this event.  . Family has hired a caregiver that patent's father really likes which has helped to relieve caregiver strain  . Patient interviewed and appropriate assessments performed . Discussed plans with patient for ongoing care management follow up and provided patient with direct contact information for care management team . Assisted patient/caregiver with obtaining information about health plan benefits . Motivational Interviewing, Solution-Focused Strategies, and Mindfulness or Relaxation Training provided during session  Patient Self Care Activities:  .  Marland Kitchen Self administers medications as prescribed, Attends all scheduled provider appointments, Calls provider office for new concerns or questions, Ability for insight, Independent living, and Motivation for treatment  Patient Coping Strengths:  . Hopefulness . Self Advocate . Able to Communicate Effectively  Patient Self Care Deficits:  . Lacks social connections  Notes: Patient is experiencing ongoing family conflict between siblings in regards to their father's care. Patient is NOT the POA of her father but it the sole caregiver. Her father's POA's are not involved and they are wanting to start paperwork to switch father's POA over  to patient. Elder Law resources provided to patient but family confirm that they already have a Clinical research associate involved.  LCSW discussed coping skills for anxiety and caregiver stress. SW used empathetic and active and reflective listening, validated patient's feelings/concerns, and provided emotional support. LCSW provided self-care examples to help patient manage her stressors and improve her mood.   LCSW sent an extensive list of resources to patient's email on 09/27/20. Resources included: caregiver support groups, caregiver support resources for patients with dementia, elder law attorneys, self-care worksheets, and mental health support within her area. Patient confirmed receiving these resources on 12/11/20.      Follow Up Plan: SW will follow up with patient by phone over the next quarter  Dickie La, [place], MSW, LCSW [facility]  [facility] [email or web address] Phone: [phone]

## 2020-12-12 LAB — COLOGUARD
COLOGUARD: NEGATIVE
Cologuard: NEGATIVE

## 2020-12-12 LAB — EXTERNAL GENERIC LAB PROCEDURE: COLOGUARD: NEGATIVE

## 2020-12-13 ENCOUNTER — Encounter: Payer: Self-pay | Admitting: Family Medicine

## 2020-12-17 ENCOUNTER — Telehealth: Payer: Self-pay

## 2020-12-17 NOTE — Telephone Encounter (Signed)
Copied from CRM [id number]. Topic: Quick Communication - See Telephone Encounter >> Dec 17, 2020  9:43 AM Aretta Nip wrote: CRM for notification. See Telephone encounter for: 12/17/20.Pls with pt, appt on Sep 09, 2023 was coded incorrect as New pt was coded Z76.89 Unbillable per billing/[facility]. Told to call office needs billable code, bill over $400.00 all decuctibles were meet. FU at [phone]

## 2020-12-26 ENCOUNTER — Telehealth: Payer: Self-pay | Admitting: Podiatry

## 2020-12-26 ENCOUNTER — Other Ambulatory Visit: Payer: Self-pay

## 2020-12-26 ENCOUNTER — Ambulatory Visit (INDEPENDENT_AMBULATORY_CARE_PROVIDER_SITE_OTHER): Payer: BC Managed Care – PPO | Admitting: Podiatry

## 2020-12-26 DIAGNOSIS — Q666 Other congenital valgus deformities of feet: Secondary | ICD-10-CM

## 2020-12-26 NOTE — Progress Notes (Signed)
Ms. Botz came to her appointment for an orthotic casting.  She was upset because no one called to inform her that she would not be seeing Ria Clock for her orthotic appointment, Hadley Pen, CMA, was going to see her for her scan.  She stated she felt like she deserved a phone call, as well as the other patients, letting her know that Raiford Noble was not in today and allow them to make the decision about whether they wanted to see someone else.  She stated she did not want to let just anyone take care of her feet, she wanted who the doctor had recommended for her to see.  I apologized to her and explained we found out late that [place] wasn't going to be here.  She asked when he would be back in the office.  I informed her that I was not sure of a return date and I apologized for the inconvenience.  I offered her an appointment for next Wednesday when we possibly will have a Pedorthist here.  She specifically wanted to know his/her name and years of experience.  She said she was going to [employer] him / her to check their qualifications because she values her feet even if we don't.  She told me to make sure we weren't filing her insurance for today's visit.  I assured her that we do not file insurance when patients come in to be measured for orthotics.  She asked if we had checked her insurance to see if her orthotics would be covered.  I informed her that Rick's assistant, Dawn, usually verifies if orthotics are covered.  She asked for Dawn's number, which I wrote down and gave to her.  She also asked who she could speak with to do a complaint.  I wrote the Office Manager's name, Baldomero Lamy, and number on a sticky note and gave it to her.  I scheduled her an appointment for 01/02/2021 @ 1:30 pm.

## 2020-12-26 NOTE — Telephone Encounter (Signed)
Pt left message and would likd a call back about the orthotics /insurance information   I returned call and she was asking if her insurance would cover the orthotics and I told her this year I have seen some  [place] plans cover all but the copay.  She then asked what the other person's name is that is there besides Rick and I told her EJ and she asked how long has he been doing this.I told him I believe 8 yrs with [employer] but he has worked with other companies for years.  She said she was upset today because no one called her to tell her Raiford Noble was not there and the girl that was there could not give her much information as to what the orthotics are made of. She stated it was a  Waist of  her time.. I apologized and explained we are not sure when Raiford Noble will be back in the office.

## 2020-12-26 NOTE — Progress Notes (Signed)
Patient came in to get molded for orthotics and when I called the patient back to the casting area, the patient wanted to know where Raiford Noble was and that I was not him. I stated to the patient that I was sorry and that Raiford Noble had an emergency and would not be here today and was not sure when he was coming back because patient wanted to know when she could come back and patient stated that someone from the office should have called to let the patient's know that he was not here and she wanted to know who I was and how much training I had and that she was upset due to no one from the office called her to let her know that Raiford Noble was not here and again I stated that I was sorry and that we do not have to get patient molded today and that we could reschedule and I walked patient to the front desk to speak with [facility].  Misty Stanley Cruise Baum[place]ner-cma

## 2020-12-27 DIAGNOSIS — M542 Cervicalgia: Secondary | ICD-10-CM | POA: Diagnosis not present

## 2020-12-27 NOTE — Telephone Encounter (Signed)
Called this patient bout her concerns. Pt was upset because Rick wasn't in the office. I apologized about the inconvenience and explain to the patient that she has already been waiting for two months we didn't want her to wait any additional time. Pt was concerned about the educational background of EJ. I explained that EJ was [employer] for 8 years with tons of Pedorthist experience. Pt will come for her appointment with EJ on 01/02/21.

## 2020-12-28 ENCOUNTER — Encounter: Payer: Self-pay | Admitting: Podiatry

## 2020-12-31 DIAGNOSIS — F330 Major depressive disorder, recurrent, mild: Secondary | ICD-10-CM | POA: Diagnosis not present

## 2020-12-31 DIAGNOSIS — M542 Cervicalgia: Secondary | ICD-10-CM | POA: Diagnosis not present

## 2020-12-31 DIAGNOSIS — F4322 Adjustment disorder with anxiety: Secondary | ICD-10-CM | POA: Diagnosis not present

## 2021-01-01 ENCOUNTER — Ambulatory Visit: Payer: Self-pay | Admitting: Gastroenterology

## 2021-01-02 ENCOUNTER — Other Ambulatory Visit: Payer: BC Managed Care – PPO

## 2021-01-23 ENCOUNTER — Ambulatory Visit: Payer: Self-pay | Admitting: Gastroenterology

## 2021-01-28 ENCOUNTER — Telehealth: Payer: Self-pay | Admitting: Family Medicine

## 2021-02-05 ENCOUNTER — Telehealth: Payer: Self-pay | Admitting: Licensed Clinical Social Worker

## 2021-02-05 ENCOUNTER — Telehealth: Payer: Self-pay

## 2021-02-05 NOTE — Telephone Encounter (Signed)
  Chronic Care Management    Clinical Social Work General Follow Up Note  02/05/2021 Name: Jennifer Hubbard MRN: [id number] DOB: May 22, 1961  Jennifer Hubbard is a 60 y.o. year old female who is a primary care patient of Anitra Lauth, Jodelle Gross, FNP. The CCM team was consulted for assistance with Mental Health Counseling and Resources.   Review of patient status, including review of consultants reports, relevant laboratory and other test results, and collaboration with appropriate care team members and the patient's provider was performed as part of comprehensive patient evaluation and provision of chronic care management services.    LCSW completed CCM outreach attempt today but was unable to reach patient successfully. A HIPPA compliant voice message was left encouraging patient to return call once available. LCSW will ask Scheduling Care Guide to reschedule CCM SW appointment with patient as well.  Outpatient Encounter Medications as of 02/05/2021  Medication Sig  . albuterol (VENTOLIN HFA) 108 (90 Base) MCG/ACT inhaler Inhale into the lungs every 6 (six) hours as needed for wheezing or shortness of breath.  . budesonide-formoterol (SYMBICORT) 160-4.5 MCG/ACT inhaler Inhale 2 puffs into the lungs 2 (two) times daily.  . chlorhexidine (PERIDEX) 0.12 % solution SMARTSIG:By Mouth  . Cholecalciferol (VITAMIN D3) 1.25 MG (50000 UT) CAPS Take by mouth.  . cycloSPORINE (RESTASIS) 0.05 % ophthalmic emulsion Administer 1 drop to both eyes Two (2) times a day.  . DULoxetine (CYMBALTA) 60 MG capsule Take 1 capsule (60 mg total) by mouth daily.  Marland Kitchen estradiol-norethindrone (ACTIVELLA) 1-0.5 MG tablet Take 1 tablet by mouth daily.  . fluconazole (DIFLUCAN) 150 MG tablet Take 1 tablet now and repeat dose in 72 hours if having continued symptoms.  . fluticasone (VERAMYST) 27.5 MCG/SPRAY nasal spray Place 2 sprays into the nose daily.  . montelukast (SINGULAIR) 10 MG tablet Take 10 mg by mouth at bedtime.  . Multiple Vitamin  (MULTIVITAMIN) capsule Take 1 capsule by mouth daily.  . NON FORMULARY   . Omega-3 Fatty Acids (FISH OIL) 1000 MG CAPS Take by mouth.  Marland Kitchen omeprazole (PRILOSEC) 40 MG capsule Take 1 capsule (40 mg total) by mouth daily.  . sertraline (ZOLOFT) 50 MG tablet Take 50 mg by mouth daily.  Marland Kitchen SPIRIVA RESPIMAT 1.25 MCG/ACT AERS SMARTSIG:2 Puff(s) Via Inhaler Daily   No facility-administered encounter medications on file as of 02/05/2021.   Follow Up Plan: Scheduling Care Guide will reach out to patient to reschedule appointment.   Dickie La, BSW, MSW, LCSW [facility] Care Management [place]  [facility] [email or web address] Phone: [phone]

## 2021-02-06 ENCOUNTER — Ambulatory Visit: Payer: Self-pay | Admitting: Licensed Clinical Social Worker

## 2021-02-06 NOTE — Chronic Care Management (AMB) (Signed)
Care Management Clinical Social Work Note  02/06/2021 Name: Jennifer Hubbard MRN: [id number] DOB: Apr 16, 1961  Jennifer Hubbard is a 60 y.o. year old female who is a primary care patient of Jennifer Hubbard, Jennifer Gross, FNP.  The Care Management team was consulted for assistance with chronic disease management and coordination needs.  Engaged with patient by telephone for follow up visit in response to provider referral for social work chronic care management and care coordination services  Consent to Services:  Jennifer Hubbard was given information about Care Management services today including:  1. Care Management services includes personalized support from designated clinical staff supervised by her physician, including individualized plan of care and coordination with other care providers 2. 24/7 contact phone numbers for assistance for urgent and routine care needs. 3. The patient may stop case management services at any time by phone call to the office staff.  Patient agreed to services and consent obtained.   Assessment: Review of patient past medical history, allergies, medications, and health status, including review of relevant consultants reports was performed today as part of a comprehensive evaluation and provision of chronic care management and care coordination services.  SDOH (Social Determinants of Health) assessments and interventions performed:    Advanced Directives Status: Not addressed in this encounter. Care Plan  Allergies  Allergen Reactions  . Cephalexin Other (See Comments)    Other reaction(s): Abdominal Pain Other reaction(s): Abdominal Pain   . Erythromycin     nausea  . Latex Other (See Comments)    Unsure if allergic  . Penicillins     "childhood allergy"  . Sulfamethoxazole-Trimethoprim Rash    Outpatient Encounter Medications as of 02/06/2021  Medication Sig  . albuterol (VENTOLIN HFA) 108 (90 Base) MCG/ACT inhaler Inhale into the lungs every 6 (six) hours as needed  for wheezing or shortness of breath.  . budesonide-formoterol (SYMBICORT) 160-4.5 MCG/ACT inhaler Inhale 2 puffs into the lungs 2 (two) times daily.  . chlorhexidine (PERIDEX) 0.12 % solution SMARTSIG:By Mouth  . Cholecalciferol (VITAMIN D3) 1.25 MG (50000 UT) CAPS Take by mouth.  . cycloSPORINE (RESTASIS) 0.05 % ophthalmic emulsion Administer 1 drop to both eyes Two (2) times a day.  . DULoxetine (CYMBALTA) 60 MG capsule Take 1 capsule (60 mg total) by mouth daily.  Marland Kitchen estradiol-norethindrone (ACTIVELLA) 1-0.5 MG tablet Take 1 tablet by mouth daily.  . fluconazole (DIFLUCAN) 150 MG tablet Take 1 tablet now and repeat dose in 72 hours if having continued symptoms.  . fluticasone (VERAMYST) 27.5 MCG/SPRAY nasal spray Place 2 sprays into the nose daily.  . montelukast (SINGULAIR) 10 MG tablet Take 10 mg by mouth at bedtime.  . Multiple Vitamin (MULTIVITAMIN) capsule Take 1 capsule by mouth daily.  . NON FORMULARY   . Omega-3 Fatty Acids (FISH OIL) 1000 MG CAPS Take by mouth.  Marland Kitchen omeprazole (PRILOSEC) 40 MG capsule Take 1 capsule (40 mg total) by mouth daily.  . sertraline (ZOLOFT) 50 MG tablet Take 50 mg by mouth daily.  Marland Kitchen SPIRIVA RESPIMAT 1.25 MCG/ACT AERS SMARTSIG:2 Puff(s) Via Inhaler Daily   No facility-administered encounter medications on file as of 02/06/2021.    Patient Active Problem List   Diagnosis Date Noted  . Annual physical exam 11/20/2020  . Screening for colon cancer 11/20/2020  . Screening for cervical cancer 11/20/2020  . Gastroesophageal reflux disease 11/20/2020  . Injury of toenail of right foot 11/20/2020  . Screening for osteoporosis 11/20/2020  . Acute vaginitis 11/20/2020  . Thrush, oral 11/20/2020  .  PSVT (paroxysmal supraventricular tachycardia) (HCC) 10/25/2020  . Palpitations 09/13/2020  . Shortness of breath 09/13/2020  . IBS (irritable bowel syndrome) 09/10/2020  . Encounter to establish care 09/06/2020  . Anxiety 09/06/2020  . Screening for malignant  neoplasm of colon 09/06/2020  . Family history of cardiac disorder in mother 09/06/2020  . Cervical dystonia 09/06/2020  . Bronchiectasis (HCC) 09/06/2020  . COVID-19 10/27/2019  . Dry eye syndrome of both eyes 03/12/2018  . Ground glass opacity present on imaging of lung 12/26/2015  . Pulmonary nodule seen on imaging study 10/13/2013  . Recurrent major depressive episodes, in full remission (HCC) 06/30/2012  . Status post bariatric surgery 06/30/2012  . 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 Extrapyramidal disease and abnormal movement disorder 10/09/2010    Conditions to be addressed/monitored: Anxiety and Depression; Mental Health Concerns   Care Plan : General Social Work (Adult)  Updates made by Gustavus BryantJoyce, Doreatha Offer L, LCSW since 02/06/2021 12:00 AM    Problem: I'm having a hard time being the sole caregiver for my father     Long-Range Goal: Find Help in My Community   Start Date: 02/06/2021  Recent Progress: On track  Priority: Medium  Note:      Timeframe:  Long-Range Goal Priority:  Medium  Start Date:  02/06/21                      Expected End Date:  05/08/21                   Follow Up Date- 04/02/21  - begin a notebook of services in my neighborhood or community - call 211 when I need some help - follow-up on any referrals for help I am given - think ahead to make sure my need does not become an emergency - make a note about what I need to have by the phone or take with me, like an identification card or social security number have a back-up plan - have a back-up plan - make a list of family or friends that I can call  -continue paying out of pocket for caregivers to increase support within the home   Why is this important?    Knowing how and where to find help for yourself or family in your neighborhood and community is an important skill.   You will want to take some steps to learn how.   Active listening utilized, counseling provided, current  coping strategies identified, decision-making supported, healthy lifestyle promoted, journaling promoted, meditative movement therapy encouraged, mindfulness encouraged, participation in counseling encouraged, problem-solving facilitated, relaxation techniques promoted, self-reflection promoted, spiritual activities promoted, verbalization of feelings encouraged, affirmation provided, community involvement promoted, expression of thoughts about present/future encouraged, independence in all possible areas promote, life review by storytelling encouraged, patient strengths promoted, psychosocial concerns monitored, self-expression encouraged, sleep diary encouraged, sleep hygiene techniques encouraged, social relationships promoted, strategies to maintain hearing and/or vision promoted and wellness behaviors promoted  Over the next 120 days, patient/caregiver will work with SW to address concerns related to care coordination needs, lack of a stable support network and lack of Economistcommunity resource education/connection. LCSW will assist patient in gaining additional support in order to maintain health and mental health appropriately  Over the next 120 days, patient will demonstrate improved adherence to self care as evidenced by implementing healthy self-care into her daily routine such as: attending all medical appointments, deep breathing exercises, taking time for self-reflection, taking medications as  prescribed, drinking water and daily exercise to improve mobility and mood.  Over the next 120 days, patient will work with SW bi-monthly by telephone or in person to reduce or manage symptoms related to stress and anxiety (related to caregiver strain) Over the next 120 days, patient will demonstrate improved health management independence as evidenced by implementing healthy self-care skills and positive support/resource implementation into their daily routine to help cope with stressors and improve overall health  and well-being  Over the next 120 days, patient or caregiver will verbalize basic understanding of depression/stress process and self health management plan as evidenced by her participation in development of long term plan of care and institution of self health management strategies  Current Barriers:  . Limited social support, Mental Health Concerns , and Limited access to caregiver . Suicidal Ideation/Homicidal Ideation: No  Interventions: . Patient interviewed and appropriate assessments performed: brief mental health assessment . Patient wishes that CCM LCSW not document what we discussed today during our session regarding her mental health. However, patient is doing well and her father's health has improved. They had a recent family gathering to celebrate her father and patient was very pleased with this. . Patient started the application process with her disability in December of 2021 but has not been approved for services yet. She has been calling weekly for updates and is hopeful that she will be approved by the end of this month.  . Family has hired a caregiver that patent's father really likes which has helped to relieve caregiver strain  . Patient interviewed and appropriate assessments performed . Discussed plans with patient for ongoing care management follow up and provided patient with direct contact information for care management team . Assisted patient/caregiver with obtaining information about health plan benefits . Motivational Interviewing, Solution-Focused Strategies, and Mindfulness or Relaxation Training provided during session . Patient reports some stress around getting her father to take his medications. Also, she reports that he has been experiencing slight memory loss more recently which causes him frustration. Reflective listening and emotional support provided to patient for these recent stressors. Patient was receptive to this support. . Patient was informed that  current CCM LCSW will be leaving position next month and her next CCM Social Work follow up visit will be with another LCSW. Patient was appreciative of support provided and receptive to news . Patient will contact [facility] today to schedule an office visit with the new provider [facility]. . Patient had experienced past family conflict between siblings in regards to their father's care. Patient is NOT the POA of her father but it the sole caregiver. Her father's POA's are not involved and they are wanting to start paperwork to switch father's POA over to patient. Elder Law resources provided to patient but family confirm that they already have a Clinical research associate involved. Marland Kitchen LCSW discussed coping skills for anxiety and caregiver stress. SW used empathetic and active and reflective listening, validated patient's feelings/concerns, and provided emotional support. LCSW provided self-care examples to help patient manage her stressors and improve her mood.  Marland Kitchen LCSW sent an extensive list of resources to patient's email on 09/27/20. Resources included: caregiver support groups, caregiver support resources for patients with dementia, elder law attorneys, self-care worksheets, and mental health support within her area. Patient confirmed receiving these resources on 12/11/20. Patient reports still having this resource information to revert back to on 02/06/21.   Patient Self Care Activities:  .  Marland Kitchen Self administers medications as prescribed, Attends all  scheduled provider appointments, Calls provider office for new concerns or questions, Ability for insight, Independent living, and Motivation for treatment  Patient Coping Strengths:  . Hopefulness . Self Advocate . Able to Communicate Effectively  Patient Self Care Deficits:  . Lacks social connections        Follow Up Plan: SW will follow up with patient by phone over the next quarter  Dickie La, [place], MSW, LCSW [facility]  [facility] [email or web address] Phone: [phone]

## 2021-02-06 NOTE — Patient Instructions (Signed)
Licensed Clinical Social Worker Visit Information  Goals we discussed today:  Goals Addressed            This Visit's Progress   . Caregiver Coping Optimized        Timeframe:  Long-Range Goal Priority:  Medium  Start Date:  02/06/21                      Expected End Date:  05/08/21                   Follow Up Date- 04/02/21  - begin a notebook of services in my neighborhood or community - call 211 when I need some help - follow-up on any referrals for help I am given - think ahead to make sure my need does not become an emergency - make a note about what I need to have by the phone or take with me, like an identification card or social security number have a back-up plan - have a back-up plan - make a list of family or friends that I can call  -continue paying out of pocket for caregivers to increase support within the home   Why is this important?    Knowing how and where to find help for yourself or family in your neighborhood and community is an important skill.   You will want to take some steps to learn how.   Active listening utilized, counseling provided, current coping strategies identified, decision-making supported, healthy lifestyle promoted, journaling promoted, meditative movement therapy encouraged, mindfulness encouraged, participation in counseling encouraged, problem-solving facilitated, relaxation techniques promoted, self-reflection promoted, spiritual activities promoted, verbalization of feelings encouraged, affirmation provided, community involvement promoted, expression of thoughts about present/future encouraged, independence in all possible areas promote, life review by storytelling encouraged, patient strengths promoted, psychosocial concerns monitored, self-expression encouraged, sleep diary encouraged, sleep hygiene techniques encouraged, social relationships promoted, strategies to maintain hearing and/or vision promoted and wellness behaviors  promoted  Over the next 120 days, patient/caregiver will work with SW to address concerns related to care coordination needs, lack of a stable support network and lack of Economist. LCSW will assist patient in gaining additional support in order to maintain health and mental health appropriately  Over the next 120 days, patient will demonstrate improved adherence to self care as evidenced by implementing healthy self-care into her daily routine such as: attending all medical appointments, deep breathing exercises, taking time for self-reflection, taking medications as prescribed, drinking water and daily exercise to improve mobility and mood.  Over the next 120 days, patient will work with SW bi-monthly by telephone or in person to reduce or manage symptoms related to stress and anxiety (related to caregiver strain) Over the next 120 days, patient will demonstrate improved health management independence as evidenced by implementing healthy self-care skills and positive support/resource implementation into their daily routine to help cope with stressors and improve overall health and well-being  Over the next 120 days, patient or caregiver will verbalize basic understanding of depression/stress process and self health management plan as evidenced by her participation in development of long term plan of care and institution of self health management strategies  Current Barriers:  . Limited social support, Mental Health Concerns , and Limited access to caregiver . Suicidal Ideation/Homicidal Ideation: No  Interventions: . Patient interviewed and appropriate assessments performed: brief mental health assessment . Patient wishes that CCM LCSW not document what we discussed today during our session regarding her  mental health. However, patient is doing well and her father's health has improved. They had a recent family gathering to celebrate her father and patient was very  pleased with this. . Patient started the application process with her disability in December of 2021 but has not been approved for services yet. She has been calling weekly for updates and is hopeful that she will be approved by the end of this month.  . Patient was informed that current CCM LCSW will be leaving position next month and her next CCM Social Work follow up visit will be with another LCSW. Patient was appreciative of support provided and receptive to news . Family has hired a caregiver that patent's father really likes which has helped to relieve caregiver strain  . Patient interviewed and appropriate assessments performed . Discussed plans with patient for ongoing care management follow up and provided patient with direct contact information for care management team . Assisted patient/caregiver with obtaining information about health plan benefits . Motivational Interviewing, Solution-Focused Strategies, and Mindfulness or Relaxation Training provided during session . Patient reports some stress around getting her father to take his medications. Also, she reports that he has been experiencing slight memory loss more recently which causes him frustration. Reflective listening and emotional support provided to patient for these recent stressors. Patient was receptive to this support. . Patient will contact [facility] today to schedule an office visit with the new provider [facility]. . Patient had experienced past family conflict between siblings in regards to their father's care. Patient is NOT the POA of her father but it the sole caregiver. Her father's POA's are not involved and they are wanting to start paperwork to switch father's POA over to patient. Elder Law resources provided to patient but family confirm that they already have a Clinical research associate involved. Marland Kitchen LCSW discussed coping skills for anxiety and caregiver stress. SW used empathetic and active and reflective listening, validated patient's  feelings/concerns, and provided emotional support. LCSW provided self-care examples to help patient manage her stressors and improve her mood.  Marland Kitchen LCSW sent an extensive list of resources to patient's email on 09/27/20. Resources included: caregiver support groups, caregiver support resources for patients with dementia, elder law attorneys, self-care worksheets, and mental health support within her area. Patient confirmed receiving these resources on 12/11/20. Patient reports still having this resource information to revert back to on 02/06/21.   Patient Self Care Activities:  . Self administers medications as prescribed, Attends all scheduled provider appointments, Calls provider office for new concerns or questions, Ability for insight, Independent living, and Motivation for treatment  Patient Coping Strengths:  . Hopefulness . Self Advocate . Able to Communicate Effectively  Patient Self Care Deficits:  . Lacks social connections       Dickie La, BSW, MSW, LCSW [facility]  [facility] [email or web address] Phone: [phone]

## 2021-02-07 ENCOUNTER — Telehealth: Payer: Self-pay

## 2021-02-07 NOTE — Telephone Encounter (Signed)
Copied from CRM [id number]. Topic: General - Other >> Feb 07, 2021  3:24 PM Pawlus, Maxine Glenn A wrote: Reason for CRM: Pt stated she has a yeast infection and  wanted to know if anything can be called in to [facility], please advise.

## 2021-02-07 NOTE — Telephone Encounter (Signed)
The pt was notified that she would need an appt for evaluation before treatment. No appt available for to day or tomorrow. I informed her that we will put her on the wait list if an appt becomes available.

## 2021-02-27 ENCOUNTER — Ambulatory Visit: Payer: Self-pay | Admitting: Gastroenterology

## 2021-02-28 ENCOUNTER — Ambulatory Visit (INDEPENDENT_AMBULATORY_CARE_PROVIDER_SITE_OTHER): Payer: BC Managed Care – PPO | Admitting: Internal Medicine

## 2021-02-28 ENCOUNTER — Other Ambulatory Visit: Payer: Self-pay

## 2021-02-28 ENCOUNTER — Encounter: Payer: Self-pay | Admitting: Internal Medicine

## 2021-02-28 DIAGNOSIS — J479 Bronchiectasis, uncomplicated: Secondary | ICD-10-CM

## 2021-02-28 DIAGNOSIS — R030 Elevated blood-pressure reading, without diagnosis of hypertension: Secondary | ICD-10-CM | POA: Diagnosis not present

## 2021-02-28 DIAGNOSIS — F419 Anxiety disorder, unspecified: Secondary | ICD-10-CM | POA: Diagnosis not present

## 2021-02-28 DIAGNOSIS — K219 Gastro-esophageal reflux disease without esophagitis: Secondary | ICD-10-CM | POA: Diagnosis not present

## 2021-02-28 DIAGNOSIS — F32A Depression, unspecified: Secondary | ICD-10-CM

## 2021-02-28 NOTE — Assessment & Plan Note (Signed)
Continue Montelukast, Symbicort, Spiriva, Albuterol and Nucala as prescribed by pulmonology Will follow

## 2021-02-28 NOTE — Assessment & Plan Note (Signed)
Elevated today and has had a few elevated readings in the past She believes this is from her nebulizer use Blood pressures at home typically better Will monitor at this time

## 2021-02-28 NOTE — Assessment & Plan Note (Signed)
Discussed the risk of serotonin syndrome with cocommitment use of Duloxetine and Serotonin Weaning instructions given for Duloxetine Will titrate Sertraline to a higher dose She no longer wants to follow with psychiatry Support offered Will monitor

## 2021-02-28 NOTE — Progress Notes (Signed)
Subjective:    Patient ID: Jennifer Hubbard, female    DOB: 09/13/1961, 60 y.o.   MRN: [id number]  HPI  Pt presents to the clinic today for follow up of chronic conditions. She is establishing care with me today, transferring care from Danielle Rankin, NP.  Elevated Blood Pressure without Diagnosis of HTN: Her BP today is 142/85. She reports she does not have high blood pressure, she just did her nebulizer before she came in. She reports her BP is typically 120/70 at home. She is not taking any antihypertensive medications at this time.  Anxiety and Depression: Chronic due to general life stress, financial issues, family stress including her father has dementia. Managed on Sertraline and Duloxetine. She has been on Duloxetine for years which did work for her back issues but eventually stopped helping with her mood. She is not seeing a therapist but is following with psychiatry. She denies SI/HI.  Bronchiectasis: Managed on Montelukast Symbicort, Spiriva, Albuterol and Nucala. She reports chronic cough. She follows with pulmonology. There are no PFT's on file.  GERD: She report thick mucous in the back of her throat but denies breakthrough on Omeprazole. She needs and upper GI but had to cancel her appt due to lack of insurance which she just got back. She plans to schedule an appt with Dr. Servando Snare.   Review of Systems      Past Medical History:  Diagnosis Date  . Allergy   . Anemia   . Asthma   . Bronchiectasis (HCC)   . Cervical dystonia   . Depression   . IBS (irritable bowel syndrome) 09/10/2020  . Sleep apnea     Current Outpatient Medications  Medication Sig Dispense Refill  . albuterol (VENTOLIN HFA) 108 (90 Base) MCG/ACT inhaler Inhale into the lungs every 6 (six) hours as needed for wheezing or shortness of breath.    . budesonide-formoterol (SYMBICORT) 160-4.5 MCG/ACT inhaler Inhale 2 puffs into the lungs 2 (two) times daily.    . chlorhexidine (PERIDEX) 0.12 % solution  SMARTSIG:By Mouth    . Cholecalciferol (VITAMIN D3) 1.25 MG (50000 UT) CAPS Take by mouth.    . cycloSPORINE (RESTASIS) 0.05 % ophthalmic emulsion Administer 1 drop to both eyes Two (2) times a day.    . DULoxetine (CYMBALTA) 60 MG capsule Take 1 capsule (60 mg total) by mouth daily. 90 capsule 1  . estradiol-norethindrone (ACTIVELLA) 1-0.5 MG tablet Take 1 tablet by mouth daily. 90 tablet 1  . fluconazole (DIFLUCAN) 150 MG tablet Take 1 tablet now and repeat dose in 72 hours if having continued symptoms. 2 tablet 0  . fluticasone (VERAMYST) 27.5 MCG/SPRAY nasal spray Place 2 sprays into the nose daily.    . montelukast (SINGULAIR) 10 MG tablet Take 10 mg by mouth at bedtime.    . Multiple Vitamin (MULTIVITAMIN) capsule Take 1 capsule by mouth daily.    . NON FORMULARY     . Omega-3 Fatty Acids (FISH OIL) 1000 MG CAPS Take by mouth.    Marland Kitchen omeprazole (PRILOSEC) 40 MG capsule Take 1 capsule (40 mg total) by mouth daily. 90 capsule 1  . sertraline (ZOLOFT) 50 MG tablet Take 50 mg by mouth daily.    Marland Kitchen SPIRIVA RESPIMAT 1.25 MCG/ACT AERS SMARTSIG:2 Puff(s) Via Inhaler Daily     No current facility-administered medications for this visit.    Allergies  Allergen Reactions  . Cephalexin Other (See Comments)    Other reaction(s): Abdominal Pain Other reaction(s): Abdominal Pain   .  Erythromycin     nausea  . Latex Other (See Comments)    Unsure if allergic  . Penicillins     "childhood allergy"  . Sulfamethoxazole-Trimethoprim Rash    Family History  Problem Relation Age of Onset  . Atrial fibrillation Mother   . Heart disease Mother   . Lung cancer Father   . Stroke Maternal Grandmother   . Breast cancer Maternal Aunt   . Breast cancer Paternal Grandmother     Social History   Socioeconomic History  . Marital status: Married    Spouse name: Not on file  . Number of children: Not on file  . Years of education: Not on file  . Highest education level: Not on file  Occupational  History  . Not on file  Tobacco Use  . Smoking status: Never Smoker  . Smokeless tobacco: Never Used  Vaping Use  . Vaping Use: Never used  Substance and Sexual Activity  . Alcohol use: Never  . Drug use: Never  . Sexual activity: Yes    Birth control/protection: None    Comment: Married  Other Topics Concern  . Not on file  Social History Narrative  . Not on file   Social Determinants of Health   Financial Resource Strain: Not on file  Food Insecurity: Not on file  Transportation Needs: Not on file  Physical Activity: Not on file  Stress: Not on file  Social Connections: Not on file  Intimate Partner Violence: Not on file     Constitutional: Denies fever, malaise, fatigue, headache or abrupt weight changes.  HEENT: Denies eye pain, eye redness, ear pain, ringing in the ears, wax buildup, runny nose, nasal congestion, bloody nose, or sore throat. Respiratory: Pt reports chronic cough. Denies difficulty breathing, shortness of breath, or sputum production.   Cardiovascular: Denies chest pain, chest tightness, palpitations or swelling in the hands or feet.  Gastrointestinal: Denies abdominal pain, bloating, constipation, diarrhea or blood in the stool.  Musculoskeletal: Pt reports a history of back pain, none currently. Denies decrease in range of motion, difficulty with gait, muscle pain or joint swelling.  Skin: Denies redness, rashes, lesions or ulcercations.  Neurological: Denies dizziness, difficulty with memory, difficulty with speech or problems with balance and coordination.  Psych: Pt has a history of anxiety and depression. Denies SI/HI.  No other specific complaints in a complete review of systems (except as listed in HPI above).  Objective:   Physical Exam  BP (!) 142/85 (BP Location: Left Arm, Patient Position: Sitting, Cuff Size: Normal)   Pulse 90   Temp 98.4 F (36.9 C) (Temporal)   Resp 20   Ht 5\' 6"  (1.676 m)   Wt 139 lb 9.6 oz (63.3 kg)   SpO2 100%    BMI 22.53 kg/m   Wt Readings from Last 3 Encounters:  11/20/20 150 lb (68 kg)  10/25/20 152 lb (68.9 kg)  09/12/20 151 lb (68.5 kg)    General: Appears her  stated age, well developed, well nourished in NAD. HEENT: Head: normal shape and size; Eyes: sclera white and EOMs intact;  Cardiovascular: Normal rate and rhythm. S1,S2 noted.  No murmur, rubs or gallops noted. No JVD or BLE edema. No carotid bruits noted. Pulmonary/Chest: Normal effort and positive vesicular breath sounds. No respiratory distress. No wheezes, rales or ronchi noted.  Musculoskeletal: No difficulty with gait.  Neurological: Alert and oriented.  Psychiatric: Mood and affect normal. Mildly anxious appearing. Judgment and thought content normal.  BMET    Component Value Date/Time   NA 140 11/20/2020 0959   NA 142 09/12/2020 1549   K 3.7 11/20/2020 0959   CL 107 11/20/2020 0959   CO2 23 11/20/2020 0959   GLUCOSE 77 11/20/2020 0959   BUN 10 11/20/2020 0959   BUN 8 09/12/2020 1549   CREATININE 0.87 11/20/2020 0959   CALCIUM 9.1 11/20/2020 0959   GFRNONAA 74 11/20/2020 0959   GFRAA 86 11/20/2020 0959    Lipid Panel     Component Value Date/Time   CHOL 186 11/20/2020 0959   TRIG 62 11/20/2020 0959   HDL 53 11/20/2020 0959   CHOLHDL 3.5 11/20/2020 0959   LDLCALC 118 (H) 11/20/2020 0959    CBC    Component Value Date/Time   WBC 5.1 11/20/2020 0959   RBC 4.55 11/20/2020 0959   HGB 13.7 11/20/2020 0959   HGB 13.1 09/12/2020 1549   HCT 40.5 11/20/2020 0959   HCT 39.2 09/12/2020 1549   PLT 237 11/20/2020 0959   PLT 218 09/12/2020 1549   MCV 89.0 11/20/2020 0959   MCV 89 09/12/2020 1549   MCH 30.1 11/20/2020 0959   MCHC 33.8 11/20/2020 0959   RDW 13.1 11/20/2020 0959   RDW 13.2 09/12/2020 1549   LYMPHSABS 1,086 11/20/2020 0959   EOSABS 92 11/20/2020 0959   BASOSABS 41 11/20/2020 0959    Hgb A1C No results found for: HGBA1C          Assessment & Plan:    Nicki Reaper, NP This  visit occurred during the SARS-CoV-2 public health emergency.  Safety protocols were in place, including screening questions prior to the visit, additional usage of staff PPE, and extensive cleaning of exam room while observing appropriate contact time as indicated for disinfecting solutions.

## 2021-02-28 NOTE — Assessment & Plan Note (Signed)
Continue Omeprazole She plans to schedule an appoint with GI for upper endoscopy

## 2021-02-28 NOTE — Patient Instructions (Signed)
  Start taking Duloxetine every other day x 1 week, then every 2 days x 1 week, then every 3 days x 1 week then stop Take Sertraline 1.5 tabs every day x 10 days then increase to 2 tabs daily thereafter   Call me if you have mood issues, increased  pain

## 2021-03-01 ENCOUNTER — Telehealth: Payer: Self-pay | Admitting: Internal Medicine

## 2021-03-01 MED ORDER — SERTRALINE HCL 50 MG PO TABS: ORAL_TABLET | ORAL | 1 refills | Status: DC

## 2021-03-01 NOTE — Telephone Encounter (Signed)
Pt seen regina yesterday and [facility] [address] in [place] phone number [phone] is still waiting on sertraline 50 mg to be sent pharmacy

## 2021-03-01 NOTE — Telephone Encounter (Signed)
Sertraline sent to [facility]

## 2021-03-01 NOTE — Addendum Note (Signed)
Addended by: Lorre Munroe on: 03/01/2021 12:36 PM   Modules accepted: Orders

## 2021-03-04 DIAGNOSIS — G243 Spasmodic torticollis: Secondary | ICD-10-CM | POA: Diagnosis not present

## 2021-03-04 DIAGNOSIS — Z79899 Other long term (current) drug therapy: Secondary | ICD-10-CM | POA: Diagnosis not present

## 2021-03-06 DIAGNOSIS — M7742 Metatarsalgia, left foot: Secondary | ICD-10-CM | POA: Diagnosis not present

## 2021-03-06 DIAGNOSIS — M7741 Metatarsalgia, right foot: Secondary | ICD-10-CM | POA: Diagnosis not present

## 2021-03-14 ENCOUNTER — Encounter: Payer: Self-pay | Admitting: Internal Medicine

## 2021-03-14 ENCOUNTER — Ambulatory Visit (INDEPENDENT_AMBULATORY_CARE_PROVIDER_SITE_OTHER): Payer: BC Managed Care – PPO | Admitting: Internal Medicine

## 2021-03-14 ENCOUNTER — Other Ambulatory Visit: Payer: Self-pay

## 2021-03-14 VITALS — BP 128/75 | HR 89 | Temp 97.3°F | Resp 17 | Ht 66.0 in | Wt 135.6 lb

## 2021-03-14 DIAGNOSIS — J471 Bronchiectasis with (acute) exacerbation: Secondary | ICD-10-CM

## 2021-03-14 DIAGNOSIS — J301 Allergic rhinitis due to pollen: Secondary | ICD-10-CM

## 2021-03-14 DIAGNOSIS — B370 Candidal stomatitis: Secondary | ICD-10-CM

## 2021-03-14 MED ORDER — FLUCONAZOLE 200 MG PO TABS: ORAL_TABLET | ORAL | 0 refills | Status: DC

## 2021-03-14 MED ORDER — AZITHROMYCIN 250 MG PO TABS: ORAL_TABLET | ORAL | 0 refills | Status: DC

## 2021-03-14 MED ORDER — PREDNISONE 20 MG PO TABS: 20.0000 mg | ORAL_TABLET | Freq: Every day | ORAL | 0 refills | Status: DC

## 2021-03-14 NOTE — Patient Instructions (Signed)
Oral Thrush, Adult Oral thrush is an infection in your mouth and throat and on your tongue. It causes white patches to form in your mouth and on your tongue. Many cases of thrush are mild. But, sometimes, thrush can be serious. People who have a weak body defense system (immune system) or other diseases can be affected more. What are the causes? This condition is caused by a type of fungus called yeast. The fungus is normally present in small amounts in the mouth and nose. If a person has a long-term illness or a weak body defense system, the fungus can grow and spread quickly. This causes thrush. What increases the risk? You are more likely to develop this condition if:  You have a weak body defense system.  You are an older adult.  You have diabetes, cancer, or HIV.  You have a dry mouth.  You are pregnant or breastfeeding.  You do not take good care of your teeth. This risk is greater for people who have false teeth (dentures).  You use antibiotic or steroid medicines. What are the signs or symptoms? Symptoms of this condition include:  A burning feeling in the mouth and throat.  White patches that stick to the mouth and tongue.  A bad taste in the mouth or trouble tasting foods.  A feeling like you have cotton in your mouth.  Pain when you eat and swallow.  Not wanting to eat as much as usual.  Cracking at the corners of the mouth.   How is this treated? This condition is treated with medicines called antifungals. These medicines prevent a fungus from growing. The medicines are either put right on the area (topical) or swallowed (oral). Your doctor will also treat other problems that you may have, such as diabetes or HIV. Follow these instructions at home: Medicines  Take or use over-the-counter and prescription medicines only as told by your doctor.  Ask your doctor about an over-the-counter medicine called gentian violet. Helping with pain and soreness To lessen  your pain:  Drink cold liquids, like water and iced tea.  Eat frozen ice pops or frozen juices.  Eat foods that are easy to swallow, like gelatin and ice cream.  Drink from a straw if you have too much pain in your mouth.   General instructions  Eat plain yogurt that has live cultures in it. Read the label to make sure that there are live cultures in your yogurt.  If you wear false teeth: ? Take them out before you go to bed. ? Brush them well. ? Soak them in a cleaner.  Rinse your mouth with warm salt-water many times a day. To make the salt-water mixture, dissolve -1 teaspoon (3-6 g) of salt in 1 cup (237 mL) of warm water. Contact a doctor if:  Your problems do not get better within 7 days of treatment.  Your infection is spreading. This may show as white areas on the skin outside of your mouth.  You are nursing your baby and you have redness and pain in the nipples. Summary  Oral thrush is an infection in your mouth and throat. It is caused by a fungus.  You are more likely to get this condition if you have a weak body defense system. Diseases like diabetes, cancer, or HIV also add to your risk.  This condition is treated with medicines called antifungals.  Contact a doctor if you do not get better within 7 days of starting treatment. This information   is not intended to replace advice given to you by your health care provider. Make sure you discuss any questions you have with your health care provider. Document Revised: 08/19/2019 Document Reviewed: 08/19/2019 Elsevier Patient Education  2021 [employer]  

## 2021-03-14 NOTE — Progress Notes (Signed)
Subjective:    Patient ID: Jennifer Hubbard, female    DOB: Dec 10, 1960, 60 y.o.   MRN: [id number]  HPI  Patient presents to clinic today with complaint of oral thrush. She noticed this 3-4 days ago. She reports a coating on her tongue and the roof of her mouth. She denies difficulty swallowing. She is an inhaled steroid and rinses her mouth after each use. She gets this intermittently and is not sure why. She recently started Nucala for management of bronchiectasis.  She also report hoarseness, PND, cough and chest congestion. This has been going on for 6 months, worse in the last 2 months. The cough is productive of grey/green mucous. She denies SOB. She denies fever, chills body aches, SOB or pleuritic chest pain. She takes Montelukast, Loratadine, Flonase, Dulera and Spiriva as prescribed.  Review of Systems      Past Medical History:  Diagnosis Date  . Allergy   . Anemia   . Asthma   . Bronchiectasis (HCC)   . Cervical dystonia   . Depression   . IBS (irritable bowel syndrome) 09/10/2020  . Sleep apnea     Current Outpatient Medications  Medication Sig Dispense Refill  . albuterol (VENTOLIN HFA) 108 (90 Base) MCG/ACT inhaler Inhale into the lungs every 6 (six) hours as needed for wheezing or shortness of breath.    . budesonide-formoterol (SYMBICORT) 160-4.5 MCG/ACT inhaler Inhale 2 puffs into the lungs 2 (two) times daily.    . Cholecalciferol (VITAMIN D3) 1.25 MG (50000 UT) CAPS Take by mouth.    . cycloSPORINE (RESTASIS) 0.05 % ophthalmic emulsion Administer 1 drop to both eyes Two (2) times a day.    . estradiol-norethindrone (ACTIVELLA) 1-0.5 MG tablet Take 1 tablet by mouth daily. 90 tablet 1  . fluticasone (VERAMYST) 27.5 MCG/SPRAY nasal spray Place 2 sprays into the nose daily.    . Mepolizumab (NUCALA) 100 MG/ML SOAJ Inject into the skin.    . montelukast (SINGULAIR) 10 MG tablet Take 10 mg by mouth at bedtime.    . Multiple Vitamin (MULTIVITAMIN) capsule Take 1  capsule by mouth daily.    . NON FORMULARY     . Omega-3 Fatty Acids (FISH OIL) 1000 MG CAPS Take by mouth.    Marland Kitchen omeprazole (PRILOSEC) 40 MG capsule Take 1 capsule (40 mg total) by mouth daily. 90 capsule 1  . sertraline (ZOLOFT) 50 MG tablet Take 1 tablet (50 mg total) by mouth daily for 10 days, THEN 2 tablets (100 mg total) daily. 60 tablet 1  . SPIRIVA RESPIMAT 1.25 MCG/ACT AERS SMARTSIG:2 Puff(s) Via Inhaler Daily     No current facility-administered medications for this visit.    Allergies  Allergen Reactions  . Cephalexin Other (See Comments)    Other reaction(s): Abdominal Pain Other reaction(s): Abdominal Pain   . Erythromycin     nausea  . Latex Other (See Comments)    Unsure if allergic  . Penicillins     "childhood allergy"  . Sulfamethoxazole-Trimethoprim Rash    Family History  Problem Relation Age of Onset  . Atrial fibrillation Mother   . Heart disease Mother   . Lung cancer Father   . Stroke Maternal Grandmother   . Breast cancer Maternal Aunt   . Breast cancer Paternal Grandmother     Social History   Socioeconomic History  . Marital status: Married    Spouse name: Not on file  . Number of children: Not on file  . Years of education:  Not on file  . Highest education level: Not on file  Occupational History  . Not on file  Tobacco Use  . Smoking status: Never Smoker  . Smokeless tobacco: Never Used  Vaping Use  . Vaping Use: Never used  Substance and Sexual Activity  . Alcohol use: Never  . Drug use: Never  . Sexual activity: Yes    Birth control/protection: None    Comment: Married  Other Topics Concern  . Not on file  Social History Narrative  . Not on file   Social Determinants of Health   Financial Resource Strain: Not on file  Food Insecurity: Not on file  Transportation Needs: Not on file  Physical Activity: Not on file  Stress: Not on file  Social Connections: Not on file  Intimate Partner Violence: Not on file      Constitutional: Denies fever, malaise, fatigue, headache or abrupt weight changes.  HEENT: Pt reports hoarsness, PND, coating of tongue and roof of mouth. Denies eye pain, eye redness, ear pain, ringing in the ears, wax buildup, runny nose, nasal congestion, bloody nose, or sore throat. Respiratory: Pt reports cough and chest congestion. Denies difficulty breathing, shortness of breath.   Cardiovascular: Denies chest pain, chest tightness, palpitations or swelling in the hands or feet.   No other specific complaints in a complete review of systems (except as listed in HPI above).  Objective:   Physical Exam   BP 128/75 (BP Location: Right Arm, Patient Position: Sitting, Cuff Size: Normal)   Pulse 89   Temp (!) 97.3 F (36.3 C) (Temporal)   Resp 17   Ht 5\' 6"  (1.676 m)   Wt 135 lb 9.6 oz (61.5 kg)   SpO2 99%   BMI 21.89 kg/m   Wt Readings from Last 3 Encounters:  02/28/21 139 lb 9.6 oz (63.3 kg)  11/20/20 150 lb (68 kg)  10/25/20 152 lb (68.9 kg)    General: Appears her stated age, well developed, well nourished in NAD. HEENT: Head: normal shape and size; Throat/Mouth: white coating noted of tongue, hoarseness noted.  Cardiovascular: Normal rate. Pulmonary/Chest: Normal effort. Neurological: Alert and oriented.   BMET    Component Value Date/Time   NA 140 11/20/2020 0959   NA 142 09/12/2020 1549   K 3.7 11/20/2020 0959   CL 107 11/20/2020 0959   CO2 23 11/20/2020 0959   GLUCOSE 77 11/20/2020 0959   BUN 10 11/20/2020 0959   BUN 8 09/12/2020 1549   CREATININE 0.87 11/20/2020 0959   CALCIUM 9.1 11/20/2020 0959   GFRNONAA 74 11/20/2020 0959   GFRAA 86 11/20/2020 0959    Lipid Panel     Component Value Date/Time   CHOL 186 11/20/2020 0959   TRIG 62 11/20/2020 0959   HDL 53 11/20/2020 0959   CHOLHDL 3.5 11/20/2020 0959   LDLCALC 118 (H) 11/20/2020 0959    CBC    Component Value Date/Time   WBC 5.1 11/20/2020 0959   RBC 4.55 11/20/2020 0959   HGB 13.7  11/20/2020 0959   HGB 13.1 09/12/2020 1549   HCT 40.5 11/20/2020 0959   HCT 39.2 09/12/2020 1549   PLT 237 11/20/2020 0959   PLT 218 09/12/2020 1549   MCV 89.0 11/20/2020 0959   MCV 89 09/12/2020 1549   MCH 30.1 11/20/2020 0959   MCHC 33.8 11/20/2020 0959   RDW 13.1 11/20/2020 0959   RDW 13.2 09/12/2020 1549   LYMPHSABS 1,086 11/20/2020 0959   EOSABS 92 11/20/2020 0959  BASOSABS 41 11/20/2020 0959    Hgb A1C No results found for: HGBA1C        Assessment & Plan:   Oral Thrush:  RX for Diflucan 200 mg PO x 1 then 100 mg PO x 4 days  Bronchiectasis, Allergic Rhinitis:  Continue Montelukast, Claritin and Flonase RX for Prednisone 20 mg daily x 5 days RX for Azithromycin x 5 days  Return precautions discussed   Nicki Reaper, NP This visit occurred during the SARS-CoV-2 public health emergency.  Safety protocols were in place, including screening questions prior to the visit, additional usage of staff PPE, and extensive cleaning of exam room while observing appropriate contact time as indicated for disinfecting solutions.

## 2021-03-29 ENCOUNTER — Telehealth: Payer: Self-pay | Admitting: Internal Medicine

## 2021-03-29 ENCOUNTER — Telehealth: Payer: Self-pay

## 2021-03-29 ENCOUNTER — Ambulatory Visit: Payer: Self-pay | Admitting: *Deleted

## 2021-03-29 MED ORDER — FLUCONAZOLE 200 MG PO TABS: ORAL_TABLET | ORAL | 0 refills | Status: DC

## 2021-03-29 NOTE — Telephone Encounter (Signed)
Copied from CRM [id number]. Topic: General - Other >> Mar 29, 2021  9:36 AM Mcneil, Ja-Kwan wrote: Reason for CRM: Pt stated her Jennifer Hubbard shows an E- appt for today at 11 am. Pt requests call back as she stated she did not schedule an appt nor was she aware of an E- appt. Pt requests call back

## 2021-03-29 NOTE — Telephone Encounter (Signed)
Pt is calling because her thrush has returned. Pt is wanting more medication to manage this. Preferred pharmacy- [facility]. [phone]

## 2021-03-29 NOTE — Telephone Encounter (Signed)
Diflucan sent to pharmacy. She needs to discuss with her pulmonologist about the recurrent thrush as it is likely coming from her inhaler use

## 2021-03-29 NOTE — Telephone Encounter (Signed)
Per initial encounter, "Patient requesting to speak with Dr Sampson Si about what to do about thrush, keeps coming back. Please call back. Patient was last seen 03/14/21. She says that she seems to not have enough supply of the medication for the thrush"; contacted pt to discuss symptoms; she says this is her 3rd bout with thrush because of her inhalers; she has been rinsing mouth out well but the thrush returns; her symptoms started 03/25/21 and  started in back of mouth then spread to tongue and cheeks; her mouth is irritated and sore/raw; recommendations made per nurse triage protocol; she verbalized understanding; decision tree completed;she would like an office appt; pt offered and accepted appt with Nicki Reaper, [facility], 04/02/21 at 1440; she would like to have a refill of Diflucan as previously described; the pt uses [facility] in [place]; the pt can be contacted at [phone];  will route to office for notification and med approval  Reason for Disposition . [1] White patches that stick to tongue or inner cheek AND [2] can be wiped off  Answer Assessment - Initial Assessment Questions 1. SYMPTOM: "What's the main symptom you're concerned about?" (e.g., chapped lips, dry mouth, lump, sores)     thrush 2. ONSET: "When did the   start?"     03/15/21 3. PAIN: "Is there any pain?" If Yes, ask: "How bad is it?" (Scale: 1-10; mild, moderate, severe)   - MILD (1-3):  doesn't interfere with eating or normal activities   - MODERATE (4-7): interferes with eating some solids and normal activities   - SEVERE (8-10):  excruciating pain, interferes with most normal activities   - SEVERE DYSPHAGIA: can't swallow liquids, drooling     Discomfort rated 8 out of 10 4. CAUSE: "What do you think is causing the symptoms?"    thrush 5. OTHER SYMPTOMS: "Do you have any other symptoms?" (e.g., fever, sore throat, toothache, swelling)     no 6. PREGNANCY: "Is there any chance you are pregnant?" "When  was your last menstrual period?"     no  Protocols used: MOUTH [facility]

## 2021-03-29 NOTE — Addendum Note (Signed)
Addended by: Lorre Munroe on: 03/29/2021 11:54 AM   Modules accepted: Orders

## 2021-03-29 NOTE — Telephone Encounter (Signed)
The pt was notified tat her prescription was called in to her pharmacy.

## 2021-03-29 NOTE — Telephone Encounter (Signed)
Duplicate message.  See other phone note from today. 

## 2021-03-29 NOTE — Telephone Encounter (Signed)
Pt is requesting a call back from [facility] today to ensure that Jennifer Hubbard sends in Medication today for the thrush. Pt would like medication prior to the end of the day so that she is not stuck with Thrush over the weekend.

## 2021-04-02 ENCOUNTER — Other Ambulatory Visit: Payer: Self-pay

## 2021-04-02 ENCOUNTER — Encounter: Payer: Self-pay | Admitting: Internal Medicine

## 2021-04-02 ENCOUNTER — Ambulatory Visit: Payer: Self-pay | Admitting: Licensed Clinical Social Worker

## 2021-04-02 ENCOUNTER — Ambulatory Visit (INDEPENDENT_AMBULATORY_CARE_PROVIDER_SITE_OTHER): Payer: BC Managed Care – PPO | Admitting: Internal Medicine

## 2021-04-02 DIAGNOSIS — J479 Bronchiectasis, uncomplicated: Secondary | ICD-10-CM

## 2021-04-02 DIAGNOSIS — B370 Candidal stomatitis: Secondary | ICD-10-CM | POA: Diagnosis not present

## 2021-04-02 MED ORDER — FLUCONAZOLE 200 MG PO TABS: ORAL_TABLET | ORAL | 0 refills | Status: DC

## 2021-04-02 MED ORDER — SERTRALINE HCL 100 MG PO TABS: 100.0000 mg | ORAL_TABLET | Freq: Every day | ORAL | 1 refills | Status: DC

## 2021-04-02 MED ORDER — OMEPRAZOLE 40 MG PO CPDR: 40.0000 mg | DELAYED_RELEASE_CAPSULE | Freq: Every day | ORAL | 1 refills | Status: DC

## 2021-04-02 MED ORDER — ESTRADIOL-NORETHINDRONE ACET 1-0.5 MG PO TABS: 1.0000 | ORAL_TABLET | Freq: Every day | ORAL | 1 refills | Status: DC

## 2021-04-02 NOTE — Patient Instructions (Signed)
Oral Thrush, Adult Oral thrush is an infection in your mouth and throat and on your tongue. It causes white patches to form in your mouth and on your tongue. Many cases of thrush are mild. But, sometimes, thrush can be serious. People who have a weak body defense system (immune system) or other diseases can be affected more. What are the causes? This condition is caused by a type of fungus called yeast. The fungus is normally present in small amounts in the mouth and nose. If a person has a long-term illness or a weak body defense system, the fungus can grow and spread quickly. This causes thrush. What increases the risk? You are more likely to develop this condition if:  You have a weak body defense system.  You are an older adult.  You have diabetes, cancer, or HIV.  You have a dry mouth.  You are pregnant or breastfeeding.  You do not take good care of your teeth. This risk is greater for people who have false teeth (dentures).  You use antibiotic or steroid medicines. What are the signs or symptoms? Symptoms of this condition include:  A burning feeling in the mouth and throat.  White patches that stick to the mouth and tongue.  A bad taste in the mouth or trouble tasting foods.  A feeling like you have cotton in your mouth.  Pain when you eat and swallow.  Not wanting to eat as much as usual.  Cracking at the corners of the mouth.   How is this treated? This condition is treated with medicines called antifungals. These medicines prevent a fungus from growing. The medicines are either put right on the area (topical) or swallowed (oral). Your doctor will also treat other problems that you may have, such as diabetes or HIV. Follow these instructions at home: Medicines  Take or use over-the-counter and prescription medicines only as told by your doctor.  Ask your doctor about an over-the-counter medicine called gentian violet. Helping with pain and soreness To lessen  your pain:  Drink cold liquids, like water and iced tea.  Eat frozen ice pops or frozen juices.  Eat foods that are easy to swallow, like gelatin and ice cream.  Drink from a straw if you have too much pain in your mouth.   General instructions  Eat plain yogurt that has live cultures in it. Read the label to make sure that there are live cultures in your yogurt.  If you wear false teeth: ? Take them out before you go to bed. ? Brush them well. ? Soak them in a cleaner.  Rinse your mouth with warm salt-water many times a day. To make the salt-water mixture, dissolve -1 teaspoon (3-6 g) of salt in 1 cup (237 mL) of warm water. Contact a doctor if:  Your problems do not get better within 7 days of treatment.  Your infection is spreading. This may show as white areas on the skin outside of your mouth.  You are nursing your baby and you have redness and pain in the nipples. Summary  Oral thrush is an infection in your mouth and throat. It is caused by a fungus.  You are more likely to get this condition if you have a weak body defense system. Diseases like diabetes, cancer, or HIV also add to your risk.  This condition is treated with medicines called antifungals.  Contact a doctor if you do not get better within 7 days of starting treatment. This information   is not intended to replace advice given to you by your health care provider. Make sure you discuss any questions you have with your health care provider. Document Revised: 08/19/2019 Document Reviewed: 08/19/2019 Elsevier Patient Education  2021 [employer]  

## 2021-04-02 NOTE — Patient Instructions (Signed)
Visit Information  Goals Addressed   None     Patient verbalizes understanding of instructions provided today and agrees to view in MyChart.   CCM enrollment status changed to "patient refused" as per patient request on 04/02/21 to discontinue enrollment. Case closed to case management services in primary care home.   Jenel Lucks, MSW, LCSW [facility] [place]  [facility] [email or web address] Phone [phone] 12:05 PM

## 2021-04-02 NOTE — Assessment & Plan Note (Signed)
Recurrent RX for Diflucan to keep on hand for flares

## 2021-04-02 NOTE — Assessment & Plan Note (Signed)
Continue current meds per pulmonology Will reach out to her pulmonologist to discuss antibiotic therapy

## 2021-04-02 NOTE — Progress Notes (Signed)
Subjective:    Patient ID: Jennifer Hubbard, female    DOB: September 30, 1961, 60 y.o.   MRN: [id number]  HPI  Pt presents to the clinic today with c/o recurrent thrush. She recently took a second round of Fluconazole for the same with good improvement in symptoms. She is taking Dulera and Veramyst nasal spray as prescribed. She does diligently wash her mouth out after each use.  She also wants to discuss her bronchiectasis. She reports chronic hoarseness, post nasal drip, cough and chest congestion. She is taking Nucala, Singulair, Spiriva, Dulera and Albuterol as prescribed. I had recently given her a round of Prednisone and Zpack which she reports significantyly improved her symptoms. She does follow with pulmonology and questions if she would be a candidate for daily/weekly/monthly antibiotic use to help with symptom management. She has an appt with [facility] 6/16 to discuss.  Review of Systems      Past Medical History:  Diagnosis Date  . Allergy   . Anemia   . Asthma   . Bronchiectasis (HCC)   . Cervical dystonia   . Depression   . IBS (irritable bowel syndrome) 09/10/2020  . Sleep apnea     Current Outpatient Medications  Medication Sig Dispense Refill  . albuterol (VENTOLIN HFA) 108 (90 Base) MCG/ACT inhaler Inhale into the lungs every 6 (six) hours as needed for wheezing or shortness of breath.    Marland Kitchen azithromycin (ZITHROMAX) 250 MG tablet Take 2 tabs today, then 1 tab daily x 4 days 6 tablet 0  . Cholecalciferol (VITAMIN D3) 1.25 MG (50000 UT) CAPS Take by mouth.    . cycloSPORINE (RESTASIS) 0.05 % ophthalmic emulsion Administer 1 drop to both eyes Two (2) times a day.    . DULoxetine (CYMBALTA) 60 MG capsule Take 60 mg by mouth daily.    Marland Kitchen estradiol-norethindrone (ACTIVELLA) 1-0.5 MG tablet Take 1 tablet by mouth daily. 90 tablet 1  . fluconazole (DIFLUCAN) 200 MG tablet Take 200 mg PO x 1 on day then 100 mg PO daily x 4 days 3 tablet 0  . fluticasone (VERAMYST) 27.5  MCG/SPRAY nasal spray Place 2 sprays into the nose daily.    . Mepolizumab (NUCALA) 100 MG/ML SOAJ Inject into the skin every 30 (thirty) days.    . mometasone-formoterol (DULERA) 200-5 MCG/ACT AERO Inhale into the lungs.    . montelukast (SINGULAIR) 10 MG tablet Take 10 mg by mouth at bedtime.    . Multiple Vitamin (MULTIVITAMIN) capsule Take 1 capsule by mouth daily.    . NON FORMULARY     . Omega-3 Fatty Acids (FISH OIL) 1000 MG CAPS Take by mouth.    Marland Kitchen omeprazole (PRILOSEC) 40 MG capsule Take 1 capsule (40 mg total) by mouth daily. 90 capsule 1  . predniSONE (DELTASONE) 20 MG tablet Take 1 tablet (20 mg total) by mouth daily with breakfast. 5 tablet 0  . sertraline (ZOLOFT) 50 MG tablet Take 1 tablet (50 mg total) by mouth daily for 10 days, THEN 2 tablets (100 mg total) daily. 60 tablet 1  . SPIRIVA RESPIMAT 1.25 MCG/ACT AERS SMARTSIG:2 Puff(s) Via Inhaler Daily     No current facility-administered medications for this visit.    Allergies  Allergen Reactions  . Cephalexin Other (See Comments)    Other reaction(s): Abdominal Pain Other reaction(s): Abdominal Pain   . Erythromycin     nausea  . Latex Other (See Comments)    Unsure if allergic  . Penicillins     "  childhood allergy"  . Sulfamethoxazole-Trimethoprim Rash    Family History  Problem Relation Age of Onset  . Atrial fibrillation Mother   . Heart disease Mother   . Lung cancer Father   . Stroke Maternal Grandmother   . Breast cancer Maternal Aunt   . Breast cancer Paternal Grandmother     Social History   Socioeconomic History  . Marital status: Married    Spouse name: Not on file  . Number of children: Not on file  . Years of education: Not on file  . Highest education level: Not on file  Occupational History  . Not on file  Tobacco Use  . Smoking status: Never Smoker  . Smokeless tobacco: Never Used  Vaping Use  . Vaping Use: Never used  Substance and Sexual Activity  . Alcohol use: Never  . Drug  use: Never  . Sexual activity: Yes    Birth control/protection: None    Comment: Married  Other Topics Concern  . Not on file  Social History Narrative  . Not on file   Social Determinants of Health   Financial Resource Strain: Not on file  Food Insecurity: Not on file  Transportation Needs: Not on file  Physical Activity: Not on file  Stress: Not on file  Social Connections: Not on file  Intimate Partner Violence: Not on file     Constitutional: Denies fever, malaise, fatigue, headache or abrupt weight changes.  HEENT: Pt reports post nasal drip, hoarseness. Denies eye pain, eye redness, ear pain, ringing in the ears, wax buildup, runny nose, nasal congestion, bloody nose, or sore throat. Respiratory: Pt reports cough and chest congestion. Denies difficulty breathing, shortness of breath, cough or sputum production.   Cardiovascular: Denies chest pain, chest tightness, palpitations or swelling in the hands or feet.   No other specific complaints in a complete review of systems (except as listed in HPI above).  Objective:   Physical Exam   BP 119/73 (BP Location: Right Arm, Patient Position: Sitting, Cuff Size: Normal)   Pulse 87   Temp (!) 97.1 F (36.2 C) (Temporal)   Resp 18   Ht 5\' 6"  (1.676 m)   Wt 132 lb 9.6 oz (60.1 kg)   SpO2 100%   BMI 21.40 kg/m   Wt Readings from Last 3 Encounters:  03/14/21 135 lb 9.6 oz (61.5 kg)  02/28/21 139 lb 9.6 oz (63.3 kg)  11/20/20 150 lb (68 kg)    General: Appears her stated age, well developed, well nourished in NAD. HEENT: Head: normal shape and size; Throat/Mouth: hoarseness noted.  Cardiovascular: Normal rate Pulmonary/Chest: Normal effort. Dry cough noted. Neurological: Alert and oriented.   BMET    Component Value Date/Time   NA 140 11/20/2020 0959   NA 142 09/12/2020 1549   K 3.7 11/20/2020 0959   CL 107 11/20/2020 0959   CO2 23 11/20/2020 0959   GLUCOSE 77 11/20/2020 0959   BUN 10 11/20/2020 0959   BUN 8  09/12/2020 1549   CREATININE 0.87 11/20/2020 0959   CALCIUM 9.1 11/20/2020 0959   GFRNONAA 74 11/20/2020 0959   GFRAA 86 11/20/2020 0959    Lipid Panel     Component Value Date/Time   CHOL 186 11/20/2020 0959   TRIG 62 11/20/2020 0959   HDL 53 11/20/2020 0959   CHOLHDL 3.5 11/20/2020 0959   LDLCALC 118 (H) 11/20/2020 0959    CBC    Component Value Date/Time   WBC 5.1 11/20/2020 0959  RBC 4.55 11/20/2020 0959   HGB 13.7 11/20/2020 0959   HGB 13.1 09/12/2020 1549   HCT 40.5 11/20/2020 0959   HCT 39.2 09/12/2020 1549   PLT 237 11/20/2020 0959   PLT 218 09/12/2020 1549   MCV 89.0 11/20/2020 0959   MCV 89 09/12/2020 1549   MCH 30.1 11/20/2020 0959   MCHC 33.8 11/20/2020 0959   RDW 13.1 11/20/2020 0959   RDW 13.2 09/12/2020 1549   LYMPHSABS 1,086 11/20/2020 0959   EOSABS 92 11/20/2020 0959   BASOSABS 41 11/20/2020 0959    Hgb A1C No results found for: HGBA1C         Assessment & Plan:    Nicki Reaper, NP This visit occurred during the SARS-CoV-2 public health emergency.  Safety protocols were in place, including screening questions prior to the visit, additional usage of staff PPE, and extensive cleaning of exam room while observing appropriate contact time as indicated for disinfecting solutions.

## 2021-04-02 NOTE — Chronic Care Management (AMB) (Signed)
Care Management Clinical Social Work Note  04/02/2021 Name: Jennifer Hubbard MRN: [id number] DOB: 1960/12/18  Jennifer Hubbard is a 60 y.o. year old female who is a primary care patient of Lorre Munroe, NP.  The Care Management team was consulted for assistance with chronic disease management and coordination needs.  Engaged with patient by telephone for follow up visit in response to provider referral for social work chronic care management and care coordination services  Consent to Services:  Jennifer Hubbard was given information about Care Management services today including:  1. Care Management services includes personalized support from designated clinical staff supervised by her physician, including individualized plan of care and coordination with other care providers 2. 24/7 contact phone numbers for assistance for urgent and routine care needs. 3. The patient may stop case management services at any time by phone call to the office staff.  Patient agreed to services and consent obtained.   Assessment: Patient reports that she was not properly informed of costs associated with CCM Services and would like to discontinue receiving services.   Follow up Plan: Patient does not require or desire continued follow-up. Will contact the office if needed  SDOH (Social Determinants of Health) assessments and interventions performed:    Advanced Directives Status: Not addressed in this encounter.  Care Plan  Allergies  Allergen Reactions  . Cephalexin Other (See Comments)    Other reaction(s): Abdominal Pain Other reaction(s): Abdominal Pain   . Erythromycin     nausea  . Latex Other (See Comments)    Unsure if allergic  . Penicillins     "childhood allergy"  . Sulfamethoxazole-Trimethoprim Rash    Outpatient Encounter Medications as of 04/02/2021  Medication Sig  . albuterol (VENTOLIN HFA) 108 (90 Base) MCG/ACT inhaler Inhale into the lungs every 6 (six) hours as needed for wheezing or  shortness of breath.  Marland Kitchen azithromycin (ZITHROMAX) 250 MG tablet Take 2 tabs today, then 1 tab daily x 4 days  . Cholecalciferol (VITAMIN D3) 1.25 MG (50000 UT) CAPS Take by mouth.  . cycloSPORINE (RESTASIS) 0.05 % ophthalmic emulsion Administer 1 drop to both eyes Two (2) times a day.  . DULoxetine (CYMBALTA) 60 MG capsule Take 60 mg by mouth daily.  Marland Kitchen estradiol-norethindrone (ACTIVELLA) 1-0.5 MG tablet Take 1 tablet by mouth daily.  . fluconazole (DIFLUCAN) 200 MG tablet Take 200 mg PO x 1 on day then 100 mg PO daily x 4 days  . fluticasone (VERAMYST) 27.5 MCG/SPRAY nasal spray Place 2 sprays into the nose daily.  . Mepolizumab (NUCALA) 100 MG/ML SOAJ Inject into the skin every 30 (thirty) days.  . mometasone-formoterol (DULERA) 200-5 MCG/ACT AERO Inhale into the lungs.  . montelukast (SINGULAIR) 10 MG tablet Take 10 mg by mouth at bedtime.  . Multiple Vitamin (MULTIVITAMIN) capsule Take 1 capsule by mouth daily.  . NON FORMULARY   . Omega-3 Fatty Acids (FISH OIL) 1000 MG CAPS Take by mouth.  Marland Kitchen omeprazole (PRILOSEC) 40 MG capsule Take 1 capsule (40 mg total) by mouth daily.  . predniSONE (DELTASONE) 20 MG tablet Take 1 tablet (20 mg total) by mouth daily with breakfast.  . sertraline (ZOLOFT) 50 MG tablet Take 1 tablet (50 mg total) by mouth daily for 10 days, THEN 2 tablets (100 mg total) daily.  Marland Kitchen SPIRIVA RESPIMAT 1.25 MCG/ACT AERS SMARTSIG:2 Puff(s) Via Inhaler Daily   No facility-administered encounter medications on file as of 04/02/2021.    Patient Active Problem List   Diagnosis Date Noted  .  Elevated blood pressure reading without diagnosis of hypertension 02/28/2021  . Gastroesophageal reflux disease 11/20/2020  . Anxiety and depression 09/06/2020  . Bronchiectasis (HCC) 09/06/2020    Conditions to be addressed/monitored: Anxiety, Depression and Caregiver Stress;   Care Plan : General Social Work (Adult)  Updates made by Bridgett Larsson, LCSW since 04/02/2021 12:00 AM     Problem: I'm having a hard time being the sole caregiver for my father     Long-Range Goal: Find Help in My Community Completed 04/02/2021  Start Date: 02/06/2021  This Visit's Progress: On track  Recent Progress: On track  Priority: Medium  Note:      Timeframe:  Long-Range Goal Priority:  Medium  Start Date:  02/06/21                      Expected End Date:  05/08/21                   Follow Up Date- 04/02/21  - begin a notebook of services in my neighborhood or community - call 211 when I need some help - follow-up on any referrals for help I am given - think ahead to make sure my need does not become an emergency - make a note about what I need to have by the phone or take with me, like an identification card or social security number have a back-up plan - have a back-up plan - make a list of family or friends that I can call  -continue paying out of pocket for caregivers to increase support within the home   Why is this important?    Knowing how and where to find help for yourself or family in your neighborhood and community is an important skill.   You will want to take some steps to learn how.   Active listening utilized, counseling provided, current coping strategies identified, decision-making supported, healthy lifestyle promoted, journaling promoted, meditative movement therapy encouraged, mindfulness encouraged, participation in counseling encouraged, problem-solving facilitated, relaxation techniques promoted, self-reflection promoted, spiritual activities promoted, verbalization of feelings encouraged, affirmation provided, community involvement promoted, expression of thoughts about present/future encouraged, independence in all possible areas promote, life review by storytelling encouraged, patient strengths promoted, psychosocial concerns monitored, self-expression encouraged, sleep diary encouraged, sleep hygiene techniques encouraged, social relationships promoted,  strategies to maintain hearing and/or vision promoted and wellness behaviors promoted  Over the next 120 days, patient/caregiver will work with SW to address concerns related to care coordination needs, lack of a stable support network and lack of Economist. LCSW will assist patient in gaining additional support in order to maintain health and mental health appropriately  Over the next 120 days, patient will demonstrate improved adherence to self care as evidenced by implementing healthy self-care into her daily routine such as: attending all medical appointments, deep breathing exercises, taking time for self-reflection, taking medications as prescribed, drinking water and daily exercise to improve mobility and mood.  Over the next 120 days, patient will work with SW bi-monthly by telephone or in person to reduce or manage symptoms related to stress and anxiety (related to caregiver strain) Over the next 120 days, patient will demonstrate improved health management independence as evidenced by implementing healthy self-care skills and positive support/resource implementation into their daily routine to help cope with stressors and improve overall health and well-being  Over the next 120 days, patient or caregiver will verbalize basic understanding of depression/stress process and self health management  plan as evidenced by her participation in development of long term plan of care and institution of self health management strategies  Current Barriers:  . Limited social support, Mental Health Concerns , and Limited access to caregiver . Suicidal Ideation/Homicidal Ideation: No  Interventions: . Patient interviewed and appropriate assessments performed: brief mental health assessment . Patient wishes that CCM LCSW not document what we discussed today during our session regarding her mental health. However, patient is doing well and her father's health has improved. They had  a recent family gathering to celebrate her father and patient was very pleased with this. . Patient started the application process with her disability in December of 2021 but has not been approved for services yet. She has been calling weekly for updates and is hopeful that she will be approved by the end of this month.  . Patient was informed that current CCM LCSW will be leaving position next month and her next CCM Social Work follow up visit will be with another LCSW. Patient was appreciative of support provided and receptive to news . Family has hired a caregiver that patent's father really likes which has helped to relieve caregiver strain  . Patient interviewed and appropriate assessments performed . Discussed plans with patient for ongoing care management follow up and provided patient with direct contact information for care management team . Assisted patient/caregiver with obtaining information about health plan benefits . Motivational Interviewing, Solution-Focused Strategies, and Mindfulness or Relaxation Training provided during session . Patient reports some stress around getting her father to take his medications. Also, she reports that he has been experiencing slight memory loss more recently which causes him frustration. Reflective listening and emotional support provided to patient for these recent stressors. Patient was receptive to this support. . Patient will contact [facility] today to schedule an office visit with the new provider [facility]. . Patient had experienced past family conflict between siblings in regards to their father's care. Patient is NOT the POA of her father but it the sole caregiver. Her father's POA's are not involved and they are wanting to start paperwork to switch father's POA over to patient. Elder Law resources provided to patient but family confirm that they already have a Clinical research associate involved. Marland Kitchen LCSW discussed coping skills for anxiety and caregiver stress. SW used  empathetic and active and reflective listening, validated patient's feelings/concerns, and provided emotional support. LCSW provided self-care examples to help patient manage her stressors and improve her mood.  Marland Kitchen LCSW sent an extensive list of resources to patient's email on 09/27/20. Resources included: caregiver support groups, caregiver support resources for patients with dementia, elder law attorneys, self-care worksheets, and mental health support within her area. Patient confirmed receiving these resources on 12/11/20. Patient reports still having this resource information to revert back to on 02/06/21.   Patient Self Care Activities:  .  Marland Kitchen Self administers medications as prescribed, Attends all scheduled provider appointments, Calls provider office for new concerns or questions, Ability for insight, Independent living, and Motivation for treatment  Patient Coping Strengths:  . Hopefulness . Self Advocate . Able to Communicate Effectively  Patient Self Care Deficits:  . Lacks social connections       Jenel Lucks, MSW, LCSW Lobbyist [facility] Care Management [place]  [facility] [email or web address] Phone [phone] 12:04 PM

## 2021-04-10 MED ORDER — FLUCONAZOLE 200 MG PO TABS: ORAL_TABLET | ORAL | 2 refills | Status: DC

## 2021-04-11 ENCOUNTER — Telehealth: Payer: Self-pay

## 2021-04-11 DIAGNOSIS — B370 Candidal stomatitis: Secondary | ICD-10-CM | POA: Diagnosis not present

## 2021-04-11 DIAGNOSIS — J479 Bronchiectasis, uncomplicated: Secondary | ICD-10-CM | POA: Diagnosis not present

## 2021-04-11 DIAGNOSIS — J4540 Moderate persistent asthma, uncomplicated: Secondary | ICD-10-CM | POA: Diagnosis not present

## 2021-04-11 DIAGNOSIS — Z6821 Body mass index (BMI) 21.0-21.9, adult: Secondary | ICD-10-CM | POA: Diagnosis not present

## 2021-04-11 NOTE — Telephone Encounter (Signed)
Copied from CRM [id number]. Topic: General - Other >> Apr 09, 2021  8:36 AM Eliseo Gum, Deedra Ehrich wrote: Reason for CRM: patient called in to speak with Dr Sampson Si about her Bronchiectasis with acute exacerbation [facility])  and what she discussed with her pulmonologist about her condition before she sees her, Dr. Raechel Ache Lauris Poag at [facility] pulmonology.. Please call back

## 2021-04-11 NOTE — Telephone Encounter (Signed)
She just needs to communicate this through a [employer]

## 2021-04-12 NOTE — Telephone Encounter (Signed)
The pt was notified of the providers recommendation.

## 2021-05-08 ENCOUNTER — Telehealth: Payer: Self-pay | Admitting: Internal Medicine

## 2021-05-08 NOTE — Telephone Encounter (Signed)
Pt called in stating she went to a Pulmonary dr and he advised her to see PCP and get a Thrush swab, pt also stated that she wanted to see about getting a higher dosage of Zoloft, she states she is having things going on in her life and wants a higher dosage. Please advise.

## 2021-05-17 ENCOUNTER — Telehealth: Payer: Self-pay | Admitting: Internal Medicine

## 2021-05-17 ENCOUNTER — Ambulatory Visit: Payer: Self-pay | Admitting: *Deleted

## 2021-05-17 DIAGNOSIS — M7742 Metatarsalgia, left foot: Secondary | ICD-10-CM | POA: Diagnosis not present

## 2021-05-17 DIAGNOSIS — M7741 Metatarsalgia, right foot: Secondary | ICD-10-CM | POA: Diagnosis not present

## 2021-05-17 NOTE — Telephone Encounter (Signed)
Patient states PCP advised when thrush returned to come into the office and have a swab taken, patient would like a follow up call as soon as possible regarding a work in appointment due to thrush returning.

## 2021-05-17 NOTE — Telephone Encounter (Signed)
C/o "thrush" in back of throat. C/o she was sent to UC for culture and sensitivity due to ongoing thrush sx. Patient reports UC does not do culture & sensitivily. PCP unavailable. Recommended patient to go to ED if swab needed or to attempt to refill medication previously prescribed for diagnosis. Patient at pharmacy and was able to get another 5 day course of medication. Patient requesting if order can be written to be able to get culture after course completed of medication, diflucan if symptoms return. Please advise. Care advise given. Patient verbalized understanding and to call back if needed or go to ED if symptoms worsen.

## 2021-05-17 NOTE — Telephone Encounter (Signed)
Copied from CRM [id number]. Topic: Appointment Scheduling - Scheduling Inquiry for Clinic >> May 17, 2021  7:04 AM Leafy Ro wrote: Reason for CRM: Pt said she was told when she got thrush in her mouth again to call and make an appt to get swab. Pt is calling to make an appt and regina is not in office. Please advise    ** I just tried calling the patient. Rene Kocher is not in office and Dr. Kirtland Bouchard is fully booked. I called to advise her to get care at either an Urgent care of [place] walk in otherwise she will have to wait until next week to be seen. I left a message on her machine.

## 2021-05-17 NOTE — Telephone Encounter (Signed)
Reason for Disposition  Nursing judgment or information in reference  Answer Assessment - Initial Assessment Questions 1. REASON FOR CALL: "What is your main concern right now?"     Requesting a culture of throat due to "thrush' 2. ONSET: "When did the na start?"     na 3. SEVERITY: "How bad is the pain?"     Bad  4. FEVER: "Do you have a fever?"     na 5. OTHER SYMPTOMS: "Do you have any other new symptoms?"     na 6. TREATMENTS AND RESPONSE: "What have you done so far to try to make this better? What medicines have you used?"     Course of diflucan x 5 days  7. PREGNANCY: "Is there any chance you are pregnant?" "When was your last menstrual period?"     na  Protocols used: No Guideline Available-A-[facility]

## 2021-05-17 NOTE — Telephone Encounter (Signed)
Pt just called and said she never received the message I had left for her this morning. She said that time to time her phone doesn't leave her messages correctly.  I told her that Rene Kocher was not in the office today and we were fully booked with Dr. Kirtland Bouchard.   Being that it is the end of the day and she does not want to wait over the weekend, I advised she go to [facility] to receive treatment for her thrush.   She said that she wants to have it cultured and treated (which she spoke with Rene Kocher about previously)  I advised her that they hopefully have the abilities to get that done in [facility] and get something prescribed in the meantime. She agreed and will be going there shortly. I told her to call next week with an update about her symptoms and to f/u with [facility].

## 2021-05-20 NOTE — Telephone Encounter (Signed)
It wouldn't be GI, it would be ENT. Does she want a referral to ENT?

## 2021-05-20 NOTE — Telephone Encounter (Signed)
The pt state that her pulmonologist told her if the thrush comes back she needs to see her PCP and get a culture swab. She said she is stuck in the middle and unsure what way to go. She also stated that she currently have an appt scheduled with GI on 05/27/2021 to address the recurrent thrush. No refills needed at this time because the patient still had refills  Please advise

## 2021-05-20 NOTE — Telephone Encounter (Signed)
Refill not appropriate. She needs to seek further evaluation as this has been an ongoing issue that does not seem to be getting better.

## 2021-05-21 NOTE — Telephone Encounter (Signed)
Again, I recommend she see ENT. This is outside my area of expertise.

## 2021-05-23 ENCOUNTER — Ambulatory Visit: Payer: BC Managed Care – PPO | Admitting: Internal Medicine

## 2021-05-27 ENCOUNTER — Ambulatory Visit (INDEPENDENT_AMBULATORY_CARE_PROVIDER_SITE_OTHER): Payer: BC Managed Care – PPO | Admitting: Gastroenterology

## 2021-05-27 ENCOUNTER — Other Ambulatory Visit: Payer: Self-pay

## 2021-05-27 VITALS — BP 157/84 | HR 94 | Temp 98.2°F | Ht 66.0 in | Wt 123.0 lb

## 2021-05-27 DIAGNOSIS — B370 Candidal stomatitis: Secondary | ICD-10-CM | POA: Diagnosis not present

## 2021-05-27 DIAGNOSIS — K219 Gastro-esophageal reflux disease without esophagitis: Secondary | ICD-10-CM | POA: Diagnosis not present

## 2021-05-27 NOTE — Progress Notes (Signed)
Jennifer Hubbard [address]  [address]  [place], [place] [address]  Main: [phone]  Fax: [phone]   Gastroenterology Consultation  Referring Provider:     Lorre Munroe, NP Primary Care Physician:  Lorre Munroe, NP Reason for Consultation:     GERD        HPI:    Chief Complaint  Patient presents with   New Patient (Initial Visit)    No family Hx of cancer grandmother diverticulitis.... Hx of gastric bypass, after surgery complications hiatal hernia and ulcer   Gastroesophageal Reflux    Reoccurring thrush, x 6 in 3 months also thick sputum... ongoing...    Jennifer Hubbard is a 60 y.o. y/o female referred for consultation & management  by Dr. Sampson Si, Salvadore Oxford, NP.  Patient reports having recurrent thrush and being prescribed Diflucan for this in the recent past.  States due to recurrent thrush, someone recommended further evaluation of the esophagus and stomach and so she wanted GI evaluation.  In addition, she also describes having burning sensation in her throat that was severe and she saw ENT for this, and she was told that her vocal cords had been affected by reflux and she was prescribed omeprazole, which she is taking once a day.  She also has elevated the head of her bed with blocks underneath the headboard, and tries not to eat at least 3 hours before bedtime.  Denies any dysphagia, the burning sensation in her throat has improved, but does have sensation of mucus in the back of her throat.  No nausea or vomiting.  No weight loss.  Reports history of Roux-en-Y gastric bypass in 2009 and states after that had some problems for which an upper GI study was done and she was told that she has an ulcer and hiatal hernia.  No prior EGD.  No family history of colon cancer.  Describes having a colonoscopy when she was 60 years old that was normal.  Had a Cologuard test this year that was negative  Previous upper GI series from May 2009 available in Care  Everywhere and was reviewed and this notes that there is no evidence for anastomotic leak status post gastric bypass surgery.  There is no mention of ulcer or hiatal hernia on this report.  ENT note from [facility] from February 2022 reviewed and this notes that patient has been adequately controlled cough and as per their note, they suspected that this is coming from bile from her GI tract, when she has reflux and advised her not to be eating after 6 PM, elevating head of bed, and taking omeprazole.  Laryngoscopy was performed at that time.  Which showed redness around the arytenoids and laryngeal pharyngeal reflux was noted to be the main cause.  Past Medical History:  Diagnosis Date   Allergy    Anemia    Asthma    Bronchiectasis (HCC)    Cervical dystonia    Depression    IBS (irritable bowel syndrome) 09/10/2020   Sleep apnea     Past Surgical History:  Procedure Laterality Date   BREAST CYST ASPIRATION Left    neg   ENDOSCOPIC CONCHA BULLOSA RESECTION Right 06/07/2020   Procedure: ENDOSCOPIC CONCHA BULLOSA RESECTION;  Surgeon: Vernie Murders, MD;  Location: [facility];  Service: ENT;  Laterality: Right;   EYE SURGERY     GASTRIC BYPASS     LAPAROSCOPIC OOPHERECTOMY     NASAL SEPTOPLASTY W/ TURBINOPLASTY Bilateral 06/07/2020  Procedure: NASAL SEPTOPLASTY WITH INFERIOR TURBINATE REDUCTION;  Surgeon: Vernie Murders, MD;  Location: [facility];  Service: ENT;  Laterality: Bilateral;    Prior to Admission medications   Medication Sig Start Date End Date Taking? Authorizing Provider  albuterol (VENTOLIN HFA) 108 (90 Base) MCG/ACT inhaler Inhale into the lungs every 6 (six) hours as needed for wheezing or shortness of breath.   Yes [provider]  Cholecalciferol (VITAMIN D3) 1.25 MG (50000 UT) CAPS Take by mouth.   Yes [provider]  cycloSPORINE (RESTASIS) 0.05 % ophthalmic emulsion Administer 1 drop to both eyes Two (2) times a day.   Yes  [provider]  estradiol-norethindrone (ACTIVELLA) 1-0.5 MG tablet Take 1 tablet by mouth daily. 04/02/21  Yes Lorre Munroe, NP  fluconazole (DIFLUCAN) 200 MG tablet Take 200 mg PO x 1 on day then 100 mg PO daily x 4 days 04/10/21  Yes Baity, Salvadore Oxford, NP  fluticasone (VERAMYST) 27.5 MCG/SPRAY nasal spray Place 2 sprays into the nose daily.   Yes [provider]  Mepolizumab (NUCALA) 100 MG/ML SOAJ Inject into the skin every 30 (thirty) days. 02/26/21 02/26/22 Yes [provider]  mometasone-formoterol (DULERA) 200-5 MCG/ACT AERO Inhale into the lungs. 03/01/21 03/01/22 Yes [provider]  montelukast (SINGULAIR) 10 MG tablet Take 10 mg by mouth at bedtime.   Yes [provider]  Multiple Vitamin (MULTIVITAMIN) capsule Take 1 capsule by mouth daily.   Yes [provider]  NON FORMULARY    Yes [provider]  Omega-3 Fatty Acids (FISH OIL) 1000 MG CAPS Take by mouth.   Yes [provider]  omeprazole (PRILOSEC) 40 MG capsule Take 1 capsule (40 mg total) by mouth daily. 04/02/21  Yes Lorre Munroe, NP  sertraline (ZOLOFT) 100 MG tablet Take 1 tablet (100 mg total) by mouth daily. 04/02/21  Yes Lorre Munroe, NP  SPIRIVA RESPIMAT 1.25 MCG/ACT AERS SMARTSIG:2 Puff(s) Via Inhaler Daily 10/16/20  Yes [provider]    Family History  Problem Relation Age of Onset   Atrial fibrillation Mother    Heart disease Mother    Lung cancer Father    Stroke Maternal Grandmother    Breast cancer Maternal Aunt    Breast cancer Paternal Grandmother      Social History   Tobacco Use   Smoking status: Never   Smokeless tobacco: Never  Vaping Use   Vaping Use: Never used  Substance Use Topics   Alcohol use: Never   Drug use: Never    Allergies as of 05/27/2021 - Review Complete 05/27/2021  Allergen Reaction Noted   Cephalexin Other (See Comments) 10/31/2011   Erythromycin  05/31/2020   Latex Other (See Comments)  05/08/2020   Penicillins  05/31/2020   Sulfamethoxazole-trimethoprim Rash 10/31/2011    Review of Systems:    All systems reviewed and negative except where noted in HPI.   Physical Exam:  Constitutional: General:   Alert,  Well-developed, well-nourished, pleasant and cooperative in NAD BP (!) 157/84   Pulse 94   Temp 98.2 F (36.8 C) (Oral)   Ht 5\' 6"  (1.676 m)   Wt 123 lb (55.8 kg)   BMI 19.85 kg/m   Eyes:  Sclera clear, no icterus.   Conjunctiva pink. PERRLA  Ears:  No scars, lesions or masses, Normal auditory acuity. Nose:  No deformity, discharge, or lesions. Mouth:  No deformity or lesions, oropharynx pink & moist.  Neck:  Supple; no masses or  thyromegaly.  Respiratory: Normal respiratory effort, Normal percussion  Gastrointestinal:  No bruits.  Soft, non-tender and non-distended without masses, hepatosplenomegaly or hernias noted.  No guarding or rebound tenderness.     Cardiac: No clubbing or edema.  No cyanosis. Normal posterior tibial pedal pulses noted.  Lymphatic:  No significant cervical or axillary adenopathy.  Psych:  Alert and cooperative. Normal mood and affect.  Musculoskeletal:  Normal gait. Head normocephalic, atraumatic. Symmetrical without gross deformities. 5/5 Upper and Lower extremity strength bilaterally.  Skin: Warm. Intact without significant lesions or rashes. No jaundice.  Neurologic:  Face symmetrical, tongue midline, Normal sensation to touch;  grossly normal neurologically.  Psych:  Alert and oriented x3, Alert and cooperative. Normal mood and affect.   Labs: CBC    Component Value Date/Time   WBC 5.1 11/20/2020 0959   RBC 4.55 11/20/2020 0959   HGB 13.7 11/20/2020 0959   HGB 13.1 09/12/2020 1549   HCT 40.5 11/20/2020 0959   HCT 39.2 09/12/2020 1549   PLT 237 11/20/2020 0959   PLT 218 09/12/2020 1549   MCV 89.0 11/20/2020 0959   MCV 89 09/12/2020 1549   MCH 30.1 11/20/2020 0959   MCHC 33.8 11/20/2020 0959   RDW 13.1  11/20/2020 0959   RDW 13.2 09/12/2020 1549   LYMPHSABS 1,086 11/20/2020 0959   EOSABS 92 11/20/2020 0959   BASOSABS 41 11/20/2020 0959   CMP     Component Value Date/Time   NA 140 11/20/2020 0959   NA 142 09/12/2020 1549   K 3.7 11/20/2020 0959   CL 107 11/20/2020 0959   CO2 23 11/20/2020 0959   GLUCOSE 77 11/20/2020 0959   BUN 10 11/20/2020 0959   BUN 8 09/12/2020 1549   CREATININE 0.87 11/20/2020 0959   CALCIUM 9.1 11/20/2020 0959   PROT 6.7 11/20/2020 0959   AST 19 11/20/2020 0959   ALT 13 11/20/2020 0959   BILITOT 0.8 11/20/2020 0959   GFRNONAA 74 11/20/2020 0959   GFRAA 86 11/20/2020 0959   Cologuard negative February 2022  Imaging Studies: Previous upper GI series from May 2009 available in Care Everywhere and was reviewed and this notes that there is no evidence for anastomotic leak status post gastric bypass surgery.  There is no mention of ulcer or hiatal hernia on this report.  CT chest November 2017 at [facility], notices sequela of gastric bypass, but does not mention any hiatal hernia  Assessment and Plan:   Jennifer Hubbard is a 61 y.o. y/o female has been referred for GERD  Patient was evaluated by ENT with diagnosis being laryngeal pharyngeal reflux and patient was placed on omeprazole and lifestyle modifications were discussed  Patient continues to have symptoms and is also interested in endoscopic evaluation given her recurrent thrush to evaluate for any esophageal abnormalities  Upper endoscopy would allow for evaluation of the esophagus to rule out any evidence of Candida, evaluation for any possible hiatal hernia, evaluate her gastric bypass anatomy, possible biopsies for H. pylori if necessary  Patient reports history of a hiatal hernia that was diagnosed on an upper GI series after her Roux-en-Y gastric bypass surgery in 2009.  Upper GI series reviewed as per above and does not note any hiatal hernia.  Most recent imaging includes a CT chest in 2017 that does  not note a hiatal hernia as mentioned above  Patient educated extensively on acid reflux lifestyle modification, including raising head of bed, not eating 3 hrs before bedtime, diet modifications.  I have discussed alternative options, risks & benefits,  which include, but are not limited to, bleeding, infection, perforation,respiratory complication & drug reaction.  The patient agrees with this plan & written consent will be obtained.    CRC screening up-to-date with Cologuard that patient has been getting by PCP  CBC CMP reviewed as per above and otherwise reassuring.  No alarm symptoms present, do indicate diagnostic colonoscopy at this time or urgent endoscopy   Dr Jennifer Hubbard  Speech recognition software was used to dictate the above note.

## 2021-05-27 NOTE — Addendum Note (Signed)
Addended by: Roena Malady on: 05/27/2021 04:18 PM   Modules accepted: Orders, SmartSet

## 2021-05-31 ENCOUNTER — Ambulatory Visit (INDEPENDENT_AMBULATORY_CARE_PROVIDER_SITE_OTHER): Payer: BC Managed Care – PPO | Admitting: Internal Medicine

## 2021-05-31 ENCOUNTER — Other Ambulatory Visit: Payer: Self-pay

## 2021-05-31 ENCOUNTER — Telehealth: Payer: Self-pay | Admitting: Gastroenterology

## 2021-05-31 ENCOUNTER — Encounter: Payer: Self-pay | Admitting: Internal Medicine

## 2021-05-31 ENCOUNTER — Other Ambulatory Visit: Payer: Self-pay | Admitting: Gastroenterology

## 2021-05-31 VITALS — BP 128/79 | Resp 17 | Ht 66.0 in | Wt 120.4 lb

## 2021-05-31 DIAGNOSIS — F419 Anxiety disorder, unspecified: Secondary | ICD-10-CM

## 2021-05-31 DIAGNOSIS — J479 Bronchiectasis, uncomplicated: Secondary | ICD-10-CM | POA: Diagnosis not present

## 2021-05-31 DIAGNOSIS — F32A Depression, unspecified: Secondary | ICD-10-CM | POA: Diagnosis not present

## 2021-05-31 DIAGNOSIS — B370 Candidal stomatitis: Secondary | ICD-10-CM | POA: Diagnosis not present

## 2021-05-31 DIAGNOSIS — J392 Other diseases of pharynx: Secondary | ICD-10-CM | POA: Diagnosis not present

## 2021-05-31 MED ORDER — FLUCONAZOLE 200 MG PO TABS: ORAL_TABLET | ORAL | 2 refills | Status: DC

## 2021-05-31 MED ORDER — BUSPIRONE HCL 5 MG PO TABS: 5.0000 mg | ORAL_TABLET | Freq: Three times a day (TID) | ORAL | 0 refills | Status: DC | PRN

## 2021-05-31 MED ORDER — NYSTATIN 100000 UNIT/ML MT SUSP: 5.0000 mL | Freq: Four times a day (QID) | OROMUCOSAL | 0 refills | Status: AC

## 2021-05-31 NOTE — Assessment & Plan Note (Signed)
Deteriorated Continue Sertraline 100 mg daily RX for Buspar 5 mg TID prn Support offered

## 2021-05-31 NOTE — Patient Instructions (Signed)

## 2021-05-31 NOTE — Assessment & Plan Note (Signed)
Continue to wash mouth out after inhaler use

## 2021-05-31 NOTE — Telephone Encounter (Signed)
Pt notified of recommendation for nystatin suspension sent to pt's pharmacy.

## 2021-05-31 NOTE — Assessment & Plan Note (Signed)
She will start Nystatin, but advised her to swish and spit instead of swallow Fluconazole refilled for future reoccurrence She has upper GI pending- scheduled for next week
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
## 2021-05-31 NOTE — Telephone Encounter (Signed)
Patient is starting with thrush in mouth,tounge,and throat (0-10 @ 9) What can she do? She is also worried if she start medication that the EGD results won't be accurate. Please advise.

## 2021-06-02 LAB — CULTURE, UPPER RESPIRATORY
MICRO NUMBER:: 12207941
SPECIMEN QUALITY:: ADEQUATE

## 2021-06-06 ENCOUNTER — Ambulatory Visit: Payer: BC Managed Care – PPO | Admitting: Anesthesiology

## 2021-06-06 ENCOUNTER — Encounter: Payer: Self-pay | Admitting: Gastroenterology

## 2021-06-06 ENCOUNTER — Encounter
Admission: RE | Disposition: A | Discharge status: Home / Self Care | Payer: Self-pay | Source: Home / Self Care | Attending: Gastroenterology

## 2021-06-06 ENCOUNTER — Other Ambulatory Visit: Payer: Self-pay

## 2021-06-06 ENCOUNTER — Ambulatory Visit
Admission: RE | Admit: 2021-06-06 | Discharge: 2021-06-06 | Disposition: A | Discharge status: Home / Self Care | Payer: BC Managed Care – PPO | Attending: Gastroenterology | Admitting: Gastroenterology

## 2021-06-06 DIAGNOSIS — K449 Diaphragmatic hernia without obstruction or gangrene: Secondary | ICD-10-CM | POA: Diagnosis not present

## 2021-06-06 DIAGNOSIS — D131 Benign neoplasm of stomach: Secondary | ICD-10-CM | POA: Diagnosis not present

## 2021-06-06 DIAGNOSIS — Z882 Allergy status to sulfonamides status: Secondary | ICD-10-CM | POA: Insufficient documentation

## 2021-06-06 DIAGNOSIS — Z79899 Other long term (current) drug therapy: Secondary | ICD-10-CM | POA: Insufficient documentation

## 2021-06-06 DIAGNOSIS — K317 Polyp of stomach and duodenum: Secondary | ICD-10-CM | POA: Diagnosis not present

## 2021-06-06 DIAGNOSIS — Z91040 Latex allergy status: Secondary | ICD-10-CM | POA: Diagnosis not present

## 2021-06-06 DIAGNOSIS — Z881 Allergy status to other antibiotic agents status: Secondary | ICD-10-CM | POA: Insufficient documentation

## 2021-06-06 DIAGNOSIS — Z9884 Bariatric surgery status: Secondary | ICD-10-CM

## 2021-06-06 DIAGNOSIS — K219 Gastro-esophageal reflux disease without esophagitis: Secondary | ICD-10-CM | POA: Insufficient documentation

## 2021-06-06 DIAGNOSIS — Z79890 Hormone replacement therapy (postmenopausal): Secondary | ICD-10-CM | POA: Diagnosis not present

## 2021-06-06 DIAGNOSIS — Z880 Allergy status to penicillin: Secondary | ICD-10-CM | POA: Insufficient documentation

## 2021-06-06 DIAGNOSIS — F32A Depression, unspecified: Secondary | ICD-10-CM | POA: Diagnosis not present

## 2021-06-06 HISTORY — PX: ESOPHAGOGASTRODUODENOSCOPY (EGD) WITH PROPOFOL: SHX5813

## 2021-06-06 SURGERY — ESOPHAGOGASTRODUODENOSCOPY (EGD) WITH PROPOFOL
Anesthesia: General

## 2021-06-06 MED ORDER — SODIUM CHLORIDE 0.9 % IV SOLN: INTRAVENOUS | Status: DC

## 2021-06-06 MED ORDER — PROPOFOL 10 MG/ML IV BOLUS
INTRAVENOUS | Status: DC | PRN
  Administered 2021-06-06: 30 mg via INTRAVENOUS
  Administered 2021-06-06: 70 mg via INTRAVENOUS

## 2021-06-06 MED ORDER — PROPOFOL 500 MG/50ML IV EMUL
INTRAVENOUS | Status: DC | PRN
  Administered 2021-06-06: 140 ug/kg/min via INTRAVENOUS

## 2021-06-06 MED ORDER — LIDOCAINE HCL (CARDIAC) PF 100 MG/5ML IV SOSY
PREFILLED_SYRINGE | INTRAVENOUS | Status: DC | PRN
  Administered 2021-06-06: 60 mg via INTRAVENOUS

## 2021-06-06 NOTE — Anesthesia Preprocedure Evaluation (Signed)
Anesthesia Evaluation  Patient identified by MRN, date of birth, ID band Patient awake    Reviewed: Allergy & Precautions, NPO status , Patient's Chart, lab work & pertinent test results  History of Anesthesia Complications Negative for: history of anesthetic complications  Airway Mallampati: III  TM Distance: >3 FB Neck ROM: full    Dental  (+) Chipped, Poor Dentition   Pulmonary shortness of breath and with exertion, asthma , sleep apnea ,    Pulmonary exam normal        Cardiovascular Exercise Tolerance: Good negative cardio ROS Normal cardiovascular exam     Neuro/Psych PSYCHIATRIC DISORDERS negative neurological ROS     GI/Hepatic Neg liver ROS, GERD  Medicated and Controlled,  Endo/Other  negative endocrine ROS  Renal/GU negative Renal ROS  negative genitourinary   Musculoskeletal   Abdominal   Peds  Hematology negative hematology ROS (+)   Anesthesia Other Findings Past Medical History: No date: Allergy No date: Anemia No date: Asthma No date: Bronchiectasis (HCC) No date: Cervical dystonia No date: Depression 09/10/2020: IBS (irritable bowel syndrome) No date: Sleep apnea  Past Surgical History: No date: BREAST CYST ASPIRATION; Left     Comment:  neg 06/07/2020: ENDOSCOPIC CONCHA BULLOSA RESECTION; Right     Comment:  Procedure: ENDOSCOPIC CONCHA BULLOSA RESECTION;                Surgeon: Vernie Murders, MD;  [facility];  Service: ENT;  Laterality: Right; No date: EYE SURGERY No date: GASTRIC BYPASS No date: LAPAROSCOPIC OOPHERECTOMY 06/07/2020: NASAL SEPTOPLASTY W/ TURBINOPLASTY; Bilateral     Comment:  Procedure: NASAL SEPTOPLASTY WITH INFERIOR TURBINATE               REDUCTION;  Surgeon: Vernie Murders, MD;  Location: [facility];  Service: ENT;  Laterality: Bilateral;  BMI    Body Mass Index: 19.37 kg/m       Reproductive/Obstetrics negative OB ROS                             Anesthesia Physical Anesthesia Plan  ASA: 3  Anesthesia Plan: General   Post-op Pain Management:    Induction: Intravenous  PONV Risk Score and Plan: Propofol infusion and TIVA  Airway Management Planned: Natural Airway and Nasal Cannula  Additional Equipment:   Intra-op Plan:   Post-operative Plan:   Informed Consent: I have reviewed the patients History and Physical, chart, labs and discussed the procedure including the risks, benefits and alternatives for the proposed anesthesia with the patient or authorized representative who has indicated his/her understanding and acceptance.     Dental Advisory Given  Plan Discussed with: Anesthesiologist, CRNA and Surgeon  Anesthesia Plan Comments: (Patient consented for risks of anesthesia including but not limited to:  - adverse reactions to medications - risk of airway placement if required - damage to eyes, teeth, lips or other oral mucosa - nerve damage due to positioning  - sore throat or hoarseness - Damage to heart, brain, nerves, lungs, other parts of body or loss of life  Patient voiced understanding.)        Anesthesia Quick Evaluation

## 2021-06-06 NOTE — Transfer of Care (Signed)
Immediate Anesthesia Transfer of Care Note  Patient: Jennifer Hubbard  Procedure(s) Performed: ESOPHAGOGASTRODUODENOSCOPY (EGD) WITH PROPOFOL  Patient Location: PACU  Anesthesia Type:General  Level of Consciousness: sedated  Airway & Oxygen Therapy: Patient Spontanous Breathing  Post-op Assessment: Report given to RN and Post -op Vital signs reviewed and stable  Post vital signs: Reviewed and stable  Last Vitals:  Vitals Value Taken Time  BP 132/86 06/06/21 0857  Temp    Pulse 78 06/06/21 0856  Resp 19 06/06/21 0856  SpO2 100 % 06/06/21 0856  Vitals shown include unvalidated device data.  Last Pain:  Vitals:   06/06/21 0825  TempSrc: Temporal  PainSc: 0-No pain         Complications: No notable events documented.

## 2021-06-06 NOTE — Anesthesia Postprocedure Evaluation (Signed)
Anesthesia Post Note  Patient: Jennifer Hubbard  Procedure(s) Performed: ESOPHAGOGASTRODUODENOSCOPY (EGD) WITH PROPOFOL  Patient location during evaluation: Endoscopy Anesthesia Type: General Level of consciousness: awake and alert Pain management: pain level controlled Vital Signs Assessment: post-procedure vital signs reviewed and stable Respiratory status: spontaneous breathing, nonlabored ventilation, respiratory function stable and patient connected to nasal cannula oxygen Cardiovascular status: blood pressure returned to baseline and stable Postop Assessment: no apparent nausea or vomiting Anesthetic complications: no   No notable events documented.   Last Vitals:  Vitals:   06/06/21 0907 06/06/21 0917  BP: (!) 142/91 (!) 146/89  Pulse: 79 78  Resp: 13 12  Temp:    SpO2: 100% 100%    Last Pain:  Vitals:   06/06/21 0917  TempSrc:   PainSc: 0-No pain                 Cleda Mccreedy Dalya Maselli

## 2021-06-06 NOTE — Op Note (Signed)
[facility] Gastroenterology Patient Name: Jennifer Hubbard Procedure Date: 06/06/2021 8:42 AM MRN: [id number] Account #: [id number] Date of Birth: 1961-01-18 Admit Type: Outpatient Age: 60 Room: [facility] ENDO ROOM 4 Gender: Female Note Status: Finalized Procedure:             Upper GI endoscopy Indications:           Follow-up of esophageal reflux Providers:             Creston Klas B. Maximino Greenland MD, MD Referring MD:          Lorre Munroe (Referring MD) Medicines:             Monitored Anesthesia Care Complications:         No immediate complications. Procedure:             Pre-Anesthesia Assessment:                        - The risks and benefits of the procedure and the                         sedation options and risks were discussed with the                         patient. All questions were answered and informed                         consent was obtained.                        - Patient identification and proposed procedure were                         verified prior to the procedure.                        - ASA Grade Assessment: II - A patient with mild                         systemic disease.                        After obtaining informed consent, the endoscope was                         passed under direct vision. Throughout the procedure,                         the patient's blood pressure, pulse, and oxygen                         saturations were monitored continuously. The Endoscope                         was introduced through the mouth, and advanced to the                         jejunum. The upper GI endoscopy was accomplished with                         ease. The  patient tolerated the procedure well. Findings:      The examined esophagus was normal.      There is no endoscopic evidence of findings consistent with candidiasis       in the entire esophagus.      A small hiatal hernia was present.      Evidence of a Roux-en-Y gastrojejunostomy  was found. The gastrojejunal       anastomosis was characterized by healthy appearing mucosa. This was       traversed. The pouch-to-jejunum limb was characterized by healthy       appearing mucosa.      A single 4 mm sessile polyp with no bleeding and no stigmata of recent       bleeding was found in the stomach. Biopsies were taken with a cold       forceps for histology.      The examined jejunum was normal. Impression:            - Normal esophagus.                        - Small hiatal hernia.                        - Roux-en-Y gastrojejunostomy with gastrojejunal                         anastomosis characterized by healthy appearing mucosa.                        - A single gastric polyp. Biopsied.                        - Normal examined jejunum. Recommendation:        - Await pathology results.                        - Continue present medications.                        - Resume previous diet.                        - Follow an antireflux regimen.                        - The findings and recommendations were discussed with                         the patient.                        - The findings and recommendations were discussed with                         the patient's family.                        - Return to primary care physician as previously                         scheduled. Procedure Code(s):     --- Professional ---  34196, Esophagogastroduodenoscopy, flexible,                         transoral; with biopsy, single or multiple Diagnosis Code(s):     --- Professional ---                        K44.9, Diaphragmatic hernia without obstruction or                         gangrene                        Z98.0, Intestinal bypass and anastomosis status                        K31.7, Polyp of stomach and duodenum                        K21.9, Gastro-esophageal reflux disease without                         esophagitis CPT copyright 2019 American  Medical Association. All rights reserved. The codes documented in this report are preliminary and upon coder review may  be revised to meet current compliance requirements.  Melodie Bouillon, MD Michel Bickers B. Maximino Greenland MD, MD 06/06/2021 9:01:19 AM This report has been signed electronically. Number of Addenda: 0 Note Initiated On: 06/06/2021 8:42 AM Estimated Blood Loss:  Estimated blood loss: none.      [facility]

## 2021-06-06 NOTE — H&P (Signed)
Melodie Bouillon, MD [address], [address], [place], [place], [address] [address], [place], [place], [address] Phone: [phone]  Fax: [phone]  Primary Care Physician:  Lorre Munroe, NP   Pre-Procedure History & Physical: HPI:  Jennifer Hubbard is a 60 y.o. female is here for an EGD.   Past Medical History:  Diagnosis Date   Allergy    Anemia    Asthma    Bronchiectasis (HCC)    Cervical dystonia    Depression    IBS (irritable bowel syndrome) 09/10/2020   Sleep apnea     Past Surgical History:  Procedure Laterality Date   BREAST CYST ASPIRATION Left    neg   ENDOSCOPIC CONCHA BULLOSA RESECTION Right 06/07/2020   Procedure: ENDOSCOPIC CONCHA BULLOSA RESECTION;  Surgeon: Vernie Murders, MD;  Location: [facility];  Service: ENT;  Laterality: Right;   EYE SURGERY     GASTRIC BYPASS     LAPAROSCOPIC OOPHERECTOMY     NASAL SEPTOPLASTY W/ TURBINOPLASTY Bilateral 06/07/2020   Procedure: NASAL SEPTOPLASTY WITH INFERIOR TURBINATE REDUCTION;  Surgeon: Vernie Murders, MD;  Location: [facility];  Service: ENT;  Laterality: Bilateral;    Prior to Admission medications   Medication Sig Start Date End Date Taking? Authorizing Provider  albuterol (VENTOLIN HFA) 108 (90 Base) MCG/ACT inhaler Inhale into the lungs every 6 (six) hours as needed for wheezing or shortness of breath.   Yes [provider]  busPIRone (BUSPAR) 5 MG tablet Take 1 tablet (5 mg total) by mouth 3 (three) times daily as needed. 05/31/21  Yes Lorre Munroe, NP  Cholecalciferol (VITAMIN D3) 1.25 MG (50000 UT) CAPS Take by mouth.   Yes [provider]  cycloSPORINE (RESTASIS) 0.05 % ophthalmic emulsion Administer 1 drop to both eyes Two (2) times a day.   Yes [provider]  estradiol-norethindrone (ACTIVELLA) 1-0.5 MG tablet Take 1 tablet by mouth daily. 04/02/21  Yes Lorre Munroe, NP  fluconazole (DIFLUCAN) 200 MG tablet Take 200 mg PO x 1 on day then  100 mg PO daily x 4 days 05/31/21  Yes Baity, Regina W, NP  fluticasone (VERAMYST) 27.5 MCG/SPRAY nasal spray Place 2 sprays into the nose daily.   Yes [provider]  Mepolizumab (NUCALA) 100 MG/ML SOAJ Inject into the skin every 30 (thirty) days. 02/26/21 02/26/22 Yes [provider]  mometasone-formoterol (DULERA) 200-5 MCG/ACT AERO Inhale into the lungs. 03/01/21 03/01/22 Yes [provider]  montelukast (SINGULAIR) 10 MG tablet Take 10 mg by mouth at bedtime.   Yes [provider]  Multiple Vitamin (MULTIVITAMIN) capsule Take 1 capsule by mouth daily.   Yes [provider]  NON FORMULARY    Yes [provider]  nystatin (MYCOSTATIN) 100000 UNIT/ML suspension Take 5 mLs (500,000 Units total) by mouth 4 (four) times daily for 7 days. 05/31/21 06/07/21 Yes Antionio Negron, Dolphus Jenny, MD  Omega-3 Fatty Acids (FISH OIL) 1000 MG CAPS Take by mouth.   Yes [provider]  omeprazole (PRILOSEC) 40 MG capsule Take 1 capsule (40 mg total) by mouth daily. 04/02/21  Yes Lorre Munroe, NP  sertraline (ZOLOFT) 100 MG tablet Take 1 tablet (100 mg total) by mouth daily. 04/02/21  Yes Lorre Munroe, NP  SPIRIVA RESPIMAT 1.25 MCG/ACT AERS SMARTSIG:2 Puff(s) Via Inhaler Daily 10/16/20  Yes [provider]    Allergies as of 05/28/2021 - Review Complete 05/27/2021  Allergen Reaction Noted   Cephalexin Other (See Comments) 10/31/2011  Erythromycin  05/31/2020   Latex Other (See Comments) 05/08/2020   Penicillins  05/31/2020   Sulfamethoxazole-trimethoprim Rash 10/31/2011    Family History  Problem Relation Age of Onset   Atrial fibrillation Mother    Heart disease Mother    Lung cancer Father    Stroke Maternal Grandmother    Breast cancer Maternal Aunt    Breast cancer Paternal Grandmother     Social History   Socioeconomic History   Marital status: Married    Spouse name: Not on file   Number of children: Not on file   Years of  education: Not on file   Highest education level: Not on file  Occupational History   Not on file  Tobacco Use   Smoking status: Never   Smokeless tobacco: Never  Vaping Use   Vaping Use: Never used  Substance and Sexual Activity   Alcohol use: Never   Drug use: Never   Sexual activity: Yes    Birth control/protection: None    Comment: Married  Other Topics Concern   Not on file  Social History Narrative   Not on file   Social Determinants of Health   Financial Resource Strain: Not on file  Food Insecurity: Not on file  Transportation Needs: Not on file  Physical Activity: Not on file  Stress: Not on file  Social Connections: Not on file  Intimate Partner Violence: Not on file    Review of Systems: See HPI, otherwise negative ROS  Constitutional: General:   Alert,  Well-developed, well-nourished, pleasant and cooperative in NAD BP (!) 157/91   Pulse 96   Temp (!) 97 F (36.1 C) (Temporal)   Resp 16   Ht 5\' 6"  (1.676 m)   Wt 54.4 kg   SpO2 100%   BMI 19.37 kg/m   Head: Normocephalic, atraumatic.   Eyes:  Sclera clear, no icterus.   Conjunctiva pink.   Mouth:  No deformity or lesions, oropharynx pink & moist.  Neck:  Supple, trachea midline  Respiratory: Normal respiratory effort  Gastrointestinal:  Soft, non-tender and non-distended without masses, hepatosplenomegaly or hernias noted.  No guarding or rebound tenderness.     Cardiac: No clubbing or edema.  No cyanosis. Normal posterior tibial pedal pulses noted.  Lymphatic:  No significant cervical adenopathy.  Psych:  Alert and cooperative. Normal mood and affect.  Musculoskeletal:   Symmetrical without gross deformities. 5/5 Lower extremity strength bilaterally.  Skin: Warm. Intact without significant lesions or rashes. No jaundice.  Neurologic:  Face symmetrical, tongue midline, Normal sensation to touch;  grossly normal neurologically.  Psych:  Alert and oriented x3, Alert and cooperative.  Normal mood and affect.  Impression/Plan: Jennifer Hubbard is here for an EGD for Acid Reflux, recurrent thrush.  Risks, benefits, limitations, and alternatives regarding the procedure have been reviewed with the patient.  Questions have been answered.  All parties agreeable.   Lawana Pai, MD  06/06/2021, 8:44 AM

## 2021-06-07 ENCOUNTER — Encounter: Payer: Self-pay | Admitting: Gastroenterology

## 2021-06-07 LAB — SURGICAL PATHOLOGY

## 2021-06-12 ENCOUNTER — Telehealth: Payer: Self-pay

## 2021-06-12 ENCOUNTER — Encounter: Payer: Self-pay | Admitting: Gastroenterology

## 2021-06-12 NOTE — Telephone Encounter (Signed)
noted 

## 2021-06-12 NOTE — Telephone Encounter (Signed)
Copied from CRM [id number]. Topic: General - Other >> Jun 12, 2021  9:58 AM Glean Salen wrote: Reason for YEB:XIDHWYS states thrush has come back again. She taking the med given, but wants to speak with nurse about what else to do.     I called and spoke with the patient and informed her of Rene Kocher verbal order to contact ENT about her recurrent thrush. She verbalize understanding, no questions or concerns.

## 2021-06-14 DIAGNOSIS — B379 Candidiasis, unspecified: Secondary | ICD-10-CM | POA: Diagnosis not present

## 2021-06-14 DIAGNOSIS — R053 Chronic cough: Secondary | ICD-10-CM | POA: Diagnosis not present

## 2021-06-14 DIAGNOSIS — K219 Gastro-esophageal reflux disease without esophagitis: Secondary | ICD-10-CM | POA: Diagnosis not present

## 2021-06-17 DIAGNOSIS — G243 Spasmodic torticollis: Secondary | ICD-10-CM | POA: Diagnosis not present

## 2021-06-17 DIAGNOSIS — Z880 Allergy status to penicillin: Secondary | ICD-10-CM | POA: Diagnosis not present

## 2021-06-26 ENCOUNTER — Ambulatory Visit: Payer: Self-pay | Admitting: Gastroenterology

## 2021-06-27 DIAGNOSIS — H02883 Meibomian gland dysfunction of right eye, unspecified eyelid: Secondary | ICD-10-CM | POA: Diagnosis not present

## 2021-06-27 DIAGNOSIS — K120 Recurrent oral aphthae: Secondary | ICD-10-CM | POA: Diagnosis not present

## 2021-06-27 DIAGNOSIS — H04123 Dry eye syndrome of bilateral lacrimal glands: Secondary | ICD-10-CM | POA: Diagnosis not present

## 2021-06-27 DIAGNOSIS — Z961 Presence of intraocular lens: Secondary | ICD-10-CM | POA: Diagnosis not present

## 2021-06-27 DIAGNOSIS — H526 Other disorders of refraction: Secondary | ICD-10-CM | POA: Diagnosis not present

## 2021-07-08 ENCOUNTER — Other Ambulatory Visit: Payer: Self-pay | Admitting: Internal Medicine

## 2021-07-08 ENCOUNTER — Telehealth: Payer: Self-pay | Admitting: Internal Medicine

## 2021-07-08 DIAGNOSIS — F32A Depression, unspecified: Secondary | ICD-10-CM

## 2021-07-08 DIAGNOSIS — F419 Anxiety disorder, unspecified: Secondary | ICD-10-CM

## 2021-07-08 NOTE — Telephone Encounter (Signed)
Medication: busPIRone (BUSPAR) 5 MG tablet [[id number]]   Has the patient contacted their pharmacy? YES (Agent: If no, request that the patient contact the pharmacy for the refill.) (Agent: If yes, when and what did the pharmacy advise?)  Preferred Pharmacy (with phone number or street name): [facility] [place], [place] - [address] [place] [address] Phone: [phone] Fax: [phone] Hours: Not open 24 hours    Agent: Please be advised that RX refills may take up to 3 business days. We ask that you follow-up with your pharmacy.

## 2021-07-08 NOTE — Telephone Encounter (Signed)
Pt is calling to report to Rene Kocher an update on her [place]. Pt went to ENT. The ENT took the pt off of the [facility] RESPIMAT 1.25 MCG/ACT AERS [[id number]]. Pt developed blisters in her mouth. Pt was pt on Valtrex -an Antiviral. And that took care of the problem.

## 2021-07-09 ENCOUNTER — Other Ambulatory Visit: Payer: Self-pay | Admitting: Internal Medicine

## 2021-07-09 DIAGNOSIS — F419 Anxiety disorder, unspecified: Secondary | ICD-10-CM

## 2021-07-09 MED ORDER — BUSPIRONE HCL 5 MG PO TABS: 5.0000 mg | ORAL_TABLET | Freq: Three times a day (TID) | ORAL | 0 refills | Status: DC | PRN

## 2021-07-09 NOTE — Telephone Encounter (Signed)
Signed 07/09/21 receipt confirmed by pharmacy 07/09/21 at 1:21 am

## 2021-07-09 NOTE — Telephone Encounter (Signed)
Noted, thanks for the update 

## 2021-08-06 ENCOUNTER — Telehealth: Payer: Self-pay

## 2021-08-06 NOTE — Telephone Encounter (Signed)
Copied from CRM [id number]. Topic: General - Other >> Aug 06, 2021  9:58 AM Jaquita Rector A wrote: Reason for CRM: Patient called in to inform Nicki Reaper that she is having re occuring thrush say that it is back also sat that she is having bruising mostly on her arms. Is concerned and need to know what to do asking for a call back at Ph# [phone]

## 2021-08-07 ENCOUNTER — Ambulatory Visit: Payer: Self-pay

## 2021-08-07 NOTE — Telephone Encounter (Signed)
Pt. Reports she has right ear pain, mild wheezing, sore throat. No cough or fever. No availability with her PCP. Requests to be [facility] see anybody." Please advise.    Reason for Disposition  Earache  (Exceptions: brief ear pain of < 60 minutes duration, earache occurring during air travel  Answer Assessment - Initial Assessment Questions 1. LOCATION: "Which ear is involved?"     Right ear 2. ONSET: "When did the ear start hurting"      2 days ago 3. SEVERITY: "How bad is the pain?"  (Scale 1-10; mild, moderate or severe)   - MILD (1-3): doesn't interfere with normal activities    - MODERATE (4-7): interferes with normal activities or awakens from sleep    - SEVERE (8-10): excruciating pain, unable to do any normal activities      7 4. URI SYMPTOMS: "Do you have a runny nose or cough?"     Sore throat, thrush 5. FEVER: "Do you have a fever?" If Yes, ask: "What is your temperature, how was it measured, and when did it start?"     No 6. CAUSE: "Have you been swimming recently?", "How often do you use Q-TIPS?", "Have you had any recent air travel or scuba diving?"     No 7. OTHER SYMPTOMS: "Do you have any other symptoms?" (e.g., headache, stiff neck, dizziness, vomiting, runny nose, decreased hearing)     No 8. PREGNANCY: "Is there any chance you are pregnant?" "When was your last menstrual period?"     No  Protocols used: Davina Poke

## 2021-08-07 NOTE — Telephone Encounter (Signed)
Will discuss at upcoming appointment tomorrow

## 2021-08-07 NOTE — Telephone Encounter (Signed)
I called the patient and she was requesting an appt today or if not an appt for [facility] to call her in a Z-pack. I informed the patient that Rene Kocher didn't have any appt for today. I gave the patient several options Mychart E-Visit, Virtual Mychart Video Visit or Urgent Care. The pt denied all the options appt scheduled for tomorrow at 1pm.

## 2021-08-08 ENCOUNTER — Ambulatory Visit (INDEPENDENT_AMBULATORY_CARE_PROVIDER_SITE_OTHER): Payer: BC Managed Care – PPO | Admitting: Internal Medicine

## 2021-08-08 ENCOUNTER — Encounter: Payer: Self-pay | Admitting: Internal Medicine

## 2021-08-08 ENCOUNTER — Other Ambulatory Visit: Payer: Self-pay

## 2021-08-08 VITALS — BP 136/85 | HR 86 | Temp 98.4°F | Resp 18 | Ht 66.0 in | Wt 113.6 lb

## 2021-08-08 DIAGNOSIS — Z23 Encounter for immunization: Secondary | ICD-10-CM

## 2021-08-08 DIAGNOSIS — J029 Acute pharyngitis, unspecified: Secondary | ICD-10-CM | POA: Diagnosis not present

## 2021-08-08 DIAGNOSIS — H9201 Otalgia, right ear: Secondary | ICD-10-CM

## 2021-08-08 DIAGNOSIS — D692 Other nonthrombocytopenic purpura: Secondary | ICD-10-CM | POA: Diagnosis not present

## 2021-08-08 MED ORDER — AZITHROMYCIN 250 MG PO TABS: ORAL_TABLET | ORAL | 0 refills | Status: DC

## 2021-08-08 NOTE — Patient Instructions (Signed)
Sore Throat When you have a sore throat, your throat may feel: Tender. Burning. Irritated. Scratchy. Painful when you swallow. Painful when you talk. Many things can cause a sore throat, such as: An infection. Allergies. Dry air. Smoke or pollution. Radiation treatment for cancer. Gastroesophageal reflux disease (GERD). A tumor. A sore throat can be the first sign of another sickness. It can happen with other problems, like: Coughing. Sneezing. Fever. Swelling of the glands in the neck. Most sore throats go away without treatment. Follow these instructions at home:   Medicines Take over-the-counter and prescription medicines only as told by your doctor. Children often get sore throats. Do not give your child aspirin. Use throat sprays to soothe your throat as told by your health care provider. Managing pain To help with pain: Sip warm liquids, such as broth, herbal tea, or warm water. Eat or drink cold or frozen liquids, such as frozen ice pops. Rinse your mouth (gargle) with a salt water mixture 3-4 times a day or as needed. To make salt water, dissolve -1 tsp (3-6 g) of salt in 1 cup (237 mL) of warm water. Do not swallow this mixture. Suck on hard candy or throat lozenges. Put a cool-mist humidifier in your bedroom at night. Sit in the bathroom with the door closed for 5-10 minutes while you run hot water in the shower. General instructions Do not smoke or use any products that contain nicotine or tobacco. If you need help quitting, ask your doctor. Get plenty of rest. Drink enough fluid to keep your pee (urine) pale yellow. Wash your hands often for at least 20 seconds with soap and water. If soap and water are not available, use hand sanitizer. Contact a doctor if: You have a fever for more than 2-3 days. You keep having symptoms for more than 2-3 days. Your throat does not get better in 7 days. You have a fever and your symptoms suddenly get worse. Your child  who is 3 months to 3 years old has a temperature of 102.2F (39C) or higher. Get help right away if: You have trouble breathing. You cannot swallow fluids, soft foods, or your spit. You have swelling in your throat or neck that gets worse. You feel like you may vomit (nauseous) and this feeling lasts a long time. You cannot stop vomiting. These symptoms may be an emergency. Get help right away. Call your local emergency services (911 in the [place] Do not wait to see if the symptoms will go away. Do not drive yourself to the hospital. Summary A sore throat is a painful, burning, irritated, or scratchy throat. Many things can cause a sore throat. Take over-the-counter medicines only as told by your doctor. Get plenty of rest. Drink enough fluid to keep your pee (urine) pale yellow. Contact a doctor if your symptoms get worse or your sore throat does not get better within 7 days. This information is not intended to replace advice given to you by your health care provider. Make sure you discuss any questions you have with your health care provider. Document Revised: 01/09/2021 Document Reviewed: 01/09/2021 Elsevier Patient Education  2022 [employer]  

## 2021-08-08 NOTE — Progress Notes (Signed)
Subjective:    Patient ID: Jennifer Hubbard, female    DOB: 04/20/1961, 60 y.o.   MRN: [id number]  HPI  Patient presents the clinic today with complaint of sore throat and right ear pain.  She reports this started but worsened in the last 4 days.  She describes the ear pain is sore and achy with some loss of hearing which seems to have improved.  She denies difficulty swallowing.  She denies headache, runny nose, nasal congestion, cough or shortness of breath.  She denies fever, chills or body aches.  She has not had exposure to COVID that she is aware of.  She has been taking Valacyclovir and Diflucan for oral ulcers and thrush.  She has a history of GERD but has not been taking her Pantoprazole.  She is also concerned about a red spot on her left forearm.  She reports this just came up.  She does not recall hitting her arm on anything.  Review of Systems     Past Medical History:  Diagnosis Date   Allergy    Anemia    Asthma    Bronchiectasis (HCC)    Cervical dystonia    Depression    IBS (irritable bowel syndrome) 09/10/2020   Sleep apnea     Current Outpatient Medications  Medication Sig Dispense Refill   albuterol (VENTOLIN HFA) 108 (90 Base) MCG/ACT inhaler Inhale into the lungs every 6 (six) hours as needed for wheezing or shortness of breath.     busPIRone (BUSPAR) 5 MG tablet Take 1 tablet (5 mg total) by mouth 3 (three) times daily as needed. 90 tablet 0   Cholecalciferol (VITAMIN D3) 1.25 MG (50000 UT) CAPS Take by mouth.     cycloSPORINE (RESTASIS) 0.05 % ophthalmic emulsion Administer 1 drop to both eyes Two (2) times a day.     estradiol-norethindrone (ACTIVELLA) 1-0.5 MG tablet TAKE 1 TABLET BY MOUTH ONCE DAILY 90 tablet 1   fluconazole (DIFLUCAN) 200 MG tablet Take 200 mg PO x 1 on day then 100 mg PO daily x 4 days 3 tablet 2   fluticasone (VERAMYST) 27.5 MCG/SPRAY nasal spray Place 2 sprays into the nose daily.     Mepolizumab (NUCALA) 100 MG/ML SOAJ Inject into the  skin every 30 (thirty) days.     mometasone-formoterol (DULERA) 200-5 MCG/ACT AERO Inhale into the lungs.     montelukast (SINGULAIR) 10 MG tablet Take 10 mg by mouth at bedtime.     Multiple Vitamin (MULTIVITAMIN) capsule Take 1 capsule by mouth daily.     NON FORMULARY      Omega-3 Fatty Acids (FISH OIL) 1000 MG CAPS Take by mouth.     omeprazole (PRILOSEC) 40 MG capsule Take 1 capsule (40 mg total) by mouth daily. 90 capsule 1   sertraline (ZOLOFT) 100 MG tablet TAKE 1 TABLET BY MOUTH ONCE DAILY 90 tablet 1   SPIRIVA RESPIMAT 1.25 MCG/ACT AERS SMARTSIG:2 Puff(s) Via Inhaler Daily     No current facility-administered medications for this visit.    Allergies  Allergen Reactions   Cephalexin Other (See Comments)    Other reaction(s): Abdominal Pain Other reaction(s): Abdominal Pain    Erythromycin     nausea   Latex Other (See Comments)    Unsure if allergic   Penicillins     "childhood allergy"   Sulfamethoxazole-Trimethoprim Rash    Family History  Problem Relation Age of Onset   Atrial fibrillation Mother    Heart disease Mother  Lung cancer Father    Stroke Maternal Grandmother    Breast cancer Maternal Aunt    Breast cancer Paternal Grandmother     Social History   Socioeconomic History   Marital status: Married    Spouse name: Not on file   Number of children: Not on file   Years of education: Not on file   Highest education level: Not on file  Occupational History   Not on file  Tobacco Use   Smoking status: Never   Smokeless tobacco: Never  Vaping Use   Vaping Use: Never used  Substance and Sexual Activity   Alcohol use: Never   Drug use: Never   Sexual activity: Yes    Birth control/protection: None    Comment: Married  Other Topics Concern   Not on file  Social History Narrative   Not on file   Social Determinants of Health   Financial Resource Strain: Not on file  Food Insecurity: Not on file  Transportation Needs: Not on file   Physical Activity: Not on file  Stress: Not on file  Social Connections: Not on file  Intimate Partner Violence: Not on file     Constitutional: Denies fever, malaise, fatigue, headache or abrupt weight changes.  HEENT: Patient reports ear pain and sore throat.  Denies eye pain, eye redness, ringing in the ears, wax buildup, runny nose, nasal congestion, bloody nose. Respiratory: Denies difficulty breathing, shortness of breath, cough or sputum production.   Cardiovascular: Denies chest pain, chest tightness, palpitations or swelling in the hands or feet.  Gastrointestinal: Denies abdominal pain, bloating, constipation, diarrhea or blood in the stool.  Skin: Patient reports discoloration of left forearm.  Denies rashes, lesions or ulcercations.    No other specific complaints in a complete review of systems (except as listed in HPI above).  Objective:   Physical Exam BP 136/85   Pulse 86   Temp 98.4 F (36.9 C)   Resp 18   SpO2 100%    Wt Readings from Last 3 Encounters:  06/06/21 120 lb (54.4 kg)  05/31/21 120 lb 6.4 oz (54.6 kg)  05/27/21 123 lb (55.8 kg)    General: Appears her stated age, well developed, well nourished in NAD. Skin: Warm, dry and intact.  Purpura noted of left forearm. HEENT: Head: normal shape and size; Eyes: EOMs intact; Ears: Tm's gray and intact, normal light reflex; Throat/Mouth: Teeth present, mucosa pink and moist, no exudate, lesions or ulcerations noted.  Neck: No adenopathy noted. Cardiovascular: Normal rate and rhythm. S1,S2 noted.  No murmur, rubs or gallops noted.  Pulmonary/Chest: Normal effort and positive vesicular breath sounds. No respiratory distress. No wheezes, rales or ronchi noted.  Neurological: Alert and oriented.   BMET    Component Value Date/Time   NA 140 11/20/2020 0959   NA 142 09/12/2020 1549   K 3.7 11/20/2020 0959   CL 107 11/20/2020 0959   CO2 23 11/20/2020 0959   GLUCOSE 77 11/20/2020 0959   BUN 10 11/20/2020  0959   BUN 8 09/12/2020 1549   CREATININE 0.87 11/20/2020 0959   CALCIUM 9.1 11/20/2020 0959   GFRNONAA 74 11/20/2020 0959   GFRAA 86 11/20/2020 0959    Lipid Panel     Component Value Date/Time   CHOL 186 11/20/2020 0959   TRIG 62 11/20/2020 0959   HDL 53 11/20/2020 0959   CHOLHDL 3.5 11/20/2020 0959   LDLCALC 118 (H) 11/20/2020 0959    CBC    Component  Value Date/Time   WBC 5.1 11/20/2020 0959   RBC 4.55 11/20/2020 0959   HGB 13.7 11/20/2020 0959   HGB 13.1 09/12/2020 1549   HCT 40.5 11/20/2020 0959   HCT 39.2 09/12/2020 1549   PLT 237 11/20/2020 0959   PLT 218 09/12/2020 1549   MCV 89.0 11/20/2020 0959   MCV 89 09/12/2020 1549   MCH 30.1 11/20/2020 0959   MCHC 33.8 11/20/2020 0959   RDW 13.1 11/20/2020 0959   RDW 13.2 09/12/2020 1549   LYMPHSABS 1,086 11/20/2020 0959   EOSABS 92 11/20/2020 0959   BASOSABS 41 11/20/2020 0959    Hgb A1C No results found for: HGBA1C         Assessment & Plan:   Sore Throat, Right Ear Pain:  Exam fairly benign, this likely could just be allergies Given durations of symptoms, she would like a Rx for Azithromycin 250 mg x 5 days  Purpura:  CBC and c-Met today   Webb Silversmith, NP This visit occurred during the SARS-CoV-2 public health emergency.  Safety protocols were in place, including screening questions prior to the visit, additional usage of staff PPE, and extensive cleaning of exam room while observing appropriate contact time as indicated for disinfecting solutions.

## 2021-08-08 NOTE — Addendum Note (Signed)
Addended by: Lonna Cobb on: 08/08/2021 01:26 PM   Modules accepted: Orders

## 2021-08-08 NOTE — Telephone Encounter (Signed)
Will discuss at upcoming appointment

## 2021-08-09 LAB — CBC WITH DIFFERENTIAL/PLATELET
Absolute Monocytes: 378 cells/uL (ref 200–950)
Basophils Absolute: 18 cells/uL (ref 0–200)
Basophils Relative: 0.3 %
Eosinophils Absolute: 12 cells/uL — ABNORMAL LOW (ref 15–500)
Eosinophils Relative: 0.2 %
HCT: 40.9 % (ref 35.0–45.0)
Hemoglobin: 12.9 g/dL (ref 11.7–15.5)
Lymphs Abs: 1080 cells/uL (ref 850–3900)
MCH: 28.9 pg (ref 27.0–33.0)
MCHC: 31.5 g/dL — ABNORMAL LOW (ref 32.0–36.0)
MCV: 91.7 fL (ref 80.0–100.0)
MPV: 11.4 fL (ref 7.5–12.5)
Monocytes Relative: 6.3 %
Neutro Abs: 4512 cells/uL (ref 1500–7800)
Neutrophils Relative %: 75.2 %
Platelets: 205 10*3/uL (ref 140–400)
RBC: 4.46 10*6/uL (ref 3.80–5.10)
RDW: 13.1 % (ref 11.0–15.0)
Total Lymphocyte: 18 %
WBC: 6 10*3/uL (ref 3.8–10.8)

## 2021-08-09 LAB — COMPREHENSIVE METABOLIC PANEL
AG Ratio: 1.8 (calc) (ref 1.0–2.5)
ALT: 16 U/L (ref 6–29)
AST: 17 U/L (ref 10–35)
Albumin: 4.5 g/dL (ref 3.6–5.1)
Alkaline phosphatase (APISO): 53 U/L (ref 37–153)
BUN: 9 mg/dL (ref 7–25)
CO2: 25 mmol/L (ref 20–32)
Calcium: 9.5 mg/dL (ref 8.6–10.4)
Chloride: 108 mmol/L (ref 98–110)
Creat: 0.87 mg/dL (ref 0.50–1.03)
Globulin: 2.5 g/dL (calc) (ref 1.9–3.7)
Glucose, Bld: 89 mg/dL (ref 65–99)
Potassium: 3.5 mmol/L (ref 3.5–5.3)
Sodium: 141 mmol/L (ref 135–146)
Total Bilirubin: 0.6 mg/dL (ref 0.2–1.2)
Total Protein: 7 g/dL (ref 6.1–8.1)

## 2021-08-21 ENCOUNTER — Telehealth: Payer: Self-pay

## 2021-08-21 NOTE — Telephone Encounter (Signed)
We will order her bone density and her mammogram at her physical in January

## 2021-08-21 NOTE — Telephone Encounter (Signed)
Copied from CRM [id number]. Topic: General - Other >> Aug 21, 2021 11:07 AM Traci Sermon wrote: Reason for CRM: Pt called in stating she wanted to see about speaking with PCP about Bone Density and Mammo, pt requested a cal back, please advise.

## 2021-08-22 NOTE — Telephone Encounter (Signed)
I sent the patient a mychart message to notify her of Reginas recommendation.

## 2021-08-27 ENCOUNTER — Ambulatory Visit: Payer: BC Managed Care – PPO | Admitting: Infectious Diseases

## 2021-08-27 ENCOUNTER — Ambulatory Visit: Payer: BC Managed Care – PPO | Attending: Infectious Diseases | Admitting: Infectious Diseases

## 2021-08-27 ENCOUNTER — Encounter: Payer: Self-pay | Admitting: Infectious Diseases

## 2021-08-27 ENCOUNTER — Other Ambulatory Visit: Payer: Self-pay

## 2021-08-27 ENCOUNTER — Other Ambulatory Visit
Admission: RE | Admit: 2021-08-27 | Discharge: 2021-08-27 | Disposition: A | Discharge status: Home / Self Care | Payer: BC Managed Care – PPO | Source: Ambulatory Visit | Attending: Infectious Diseases | Admitting: Infectious Diseases

## 2021-08-27 ENCOUNTER — Telehealth: Payer: Self-pay

## 2021-08-27 ENCOUNTER — Other Ambulatory Visit: Payer: BC Managed Care – PPO

## 2021-08-27 ENCOUNTER — Ambulatory Visit: Payer: BC Managed Care – PPO | Admitting: Internal Medicine

## 2021-08-27 VITALS — BP 138/88 | HR 83 | Resp 16 | Ht 66.0 in | Wt 117.2 lb

## 2021-08-27 DIAGNOSIS — Z888 Allergy status to other drugs, medicaments and biological substances status: Secondary | ICD-10-CM | POA: Diagnosis not present

## 2021-08-27 DIAGNOSIS — Z880 Allergy status to penicillin: Secondary | ICD-10-CM | POA: Diagnosis not present

## 2021-08-27 DIAGNOSIS — J479 Bronchiectasis, uncomplicated: Secondary | ICD-10-CM | POA: Insufficient documentation

## 2021-08-27 DIAGNOSIS — E60 Dietary zinc deficiency: Secondary | ICD-10-CM | POA: Insufficient documentation

## 2021-08-27 DIAGNOSIS — Z881 Allergy status to other antibiotic agents status: Secondary | ICD-10-CM | POA: Insufficient documentation

## 2021-08-27 DIAGNOSIS — G243 Spasmodic torticollis: Secondary | ICD-10-CM | POA: Diagnosis not present

## 2021-08-27 DIAGNOSIS — K1370 Unspecified lesions of oral mucosa: Secondary | ICD-10-CM | POA: Insufficient documentation

## 2021-08-27 DIAGNOSIS — J45909 Unspecified asthma, uncomplicated: Secondary | ICD-10-CM | POA: Insufficient documentation

## 2021-08-27 DIAGNOSIS — Z79899 Other long term (current) drug therapy: Secondary | ICD-10-CM | POA: Insufficient documentation

## 2021-08-27 DIAGNOSIS — Z9884 Bariatric surgery status: Secondary | ICD-10-CM | POA: Diagnosis not present

## 2021-08-27 DIAGNOSIS — Z7951 Long term (current) use of inhaled steroids: Secondary | ICD-10-CM | POA: Diagnosis not present

## 2021-08-27 DIAGNOSIS — K1379 Other lesions of oral mucosa: Secondary | ICD-10-CM

## 2021-08-27 DIAGNOSIS — E610 Copper deficiency: Secondary | ICD-10-CM | POA: Insufficient documentation

## 2021-08-27 NOTE — Progress Notes (Signed)
NAME: Jennifer Hubbard  DOB: 05/19/1961  MRN: [id number]  Date/Time: 08/27/2021 11:40 AM  Subjective:   ? Jennifer Hubbard is a 60 y.o. female with a history of asthma, bronchiectasis,  Cervical dystonia, h/o gastric bypass surgery is referred to me for oral ulcers by her ENT and PCP Pt says she started Nucala ( mepoluzimab) for hyereosniophilia few months ago and and stopped after 3 monthly injections because at the same time she started noticing oral lesions- starts as small blisters that breaks into an erythematous spot on the roof of the mouth- also would be white at times- She was treated for candidia and also for HSV by her ENT with no improvement. These lesions comes and goes. She also has been on a ketodiet since [place] this year and lost 35 pounds and weighs 117 now. She has had gastric bypass surgery . 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 She sucks on cough drops especially at night She says her life is stressful as she takes care of her dad who has dementia and her siblings are taking her to court. Has 2 children she has adopted ( 16/11)   Past Medical History:  Diagnosis Date   Allergy    Anemia    Asthma    Bronchiectasis (HCC)    Cervical dystonia    Depression    IBS (irritable bowel syndrome) 09/10/2020   Sleep apnea     Past Surgical History:  Procedure Laterality Date   BREAST CYST ASPIRATION Left    neg   ENDOSCOPIC CONCHA BULLOSA RESECTION Right 06/07/2020   Procedure: ENDOSCOPIC CONCHA BULLOSA RESECTION;  Surgeon: Margaretha Sheffield, MD;  Location: [place];  Service: ENT;  Laterality: Right;   ESOPHAGOGASTRODUODENOSCOPY (EGD) WITH PROPOFOL N/A 06/06/2021   Procedure: ESOPHAGOGASTRODUODENOSCOPY (EGD) WITH PROPOFOL;  Surgeon: Virgel Manifold, MD;  Location: [facility];  Service: Endoscopy;  Laterality: N/A;   EYE SURGERY     GASTRIC BYPASS     LAPAROSCOPIC OOPHERECTOMY     NASAL  SEPTOPLASTY W/ TURBINOPLASTY Bilateral 06/07/2020   Procedure: NASAL SEPTOPLASTY WITH INFERIOR TURBINATE REDUCTION;  Surgeon: Margaretha Sheffield, MD;  Location: [place];  Service: ENT;  Laterality: Bilateral;    Social History   Socioeconomic History   Marital status: Married    Spouse name: Not on file   Number of children: Not on file   Years of education: Not on file   Highest education level: Not on file  Occupational History   Not on file  Tobacco Use   Smoking status: Never   Smokeless tobacco: Never  Vaping Use   Vaping Use: Never used  Substance and Sexual Activity   Alcohol use: Never   Drug use: Never   Sexual activity: Yes    Birth control/protection: None    Comment: Married  Other Topics Concern   Not on file  Social History Narrative   Not on file   Social Determinants of Health   Financial Resource Strain: Not on file  Food Insecurity: Not on file  Transportation Needs: Not on file  Physical Activity: Not on file  Stress: Not on file  Social Connections: Not on file  Intimate Partner Violence: Not on file    Family History  Problem Relation Age of Onset   Atrial fibrillation Mother    Heart disease Mother    Lung cancer Father    Stroke Maternal  Grandmother    Breast cancer Maternal Aunt    Breast cancer Paternal Grandmother    Allergies  Allergen Reactions   Cephalexin Other (See Comments)    Other reaction(s): Abdominal Pain Other reaction(s): Abdominal Pain    Erythromycin     nausea   Latex Other (See Comments)    Unsure if allergic   Penicillins     "childhood allergy"   Sulfamethoxazole-Trimethoprim Rash   I? Current Outpatient Medications  Medication Sig Dispense Refill   albuterol (VENTOLIN HFA) 108 (90 Base) MCG/ACT inhaler Inhale into the lungs every 6 (six) hours as needed for wheezing or shortness of breath.     busPIRone (BUSPAR) 5 MG tablet Take 1 tablet (5 mg total) by mouth 3 (three) times daily as needed. 90  tablet 0   Cholecalciferol (VITAMIN D3) 1.25 MG (50000 UT) CAPS Take by mouth.     cycloSPORINE (RESTASIS) 0.05 % ophthalmic emulsion Administer 1 drop to both eyes Two (2) times a day.     estradiol-norethindrone (ACTIVELLA) 1-0.5 MG tablet TAKE 1 TABLET BY MOUTH ONCE DAILY 90 tablet 1   fluticasone (VERAMYST) 27.5 MCG/SPRAY nasal spray Place 2 sprays into the nose daily.     loratadine (CLARITIN) 10 MG tablet Take 10 mg by mouth daily.     mometasone-formoterol (DULERA) 200-5 MCG/ACT AERO Inhale into the lungs.     montelukast (SINGULAIR) 10 MG tablet Take 10 mg by mouth at bedtime.     Multiple Vitamin (MULTIVITAMIN) capsule Take 1 capsule by mouth daily.     Omega-3 Fatty Acids (FISH OIL) 1000 MG CAPS Take by mouth.     omeprazole (PRILOSEC) 40 MG capsule Take 1 capsule (40 mg total) by mouth daily. 90 capsule 1   sertraline (ZOLOFT) 100 MG tablet TAKE 1 TABLET BY MOUTH ONCE DAILY 90 tablet 1   vitamin E 180 MG (400 UNITS) capsule Take 400 Units by mouth daily.     fluconazole (DIFLUCAN) 200 MG tablet Take 200 mg PO x 1 on day then 100 mg PO daily x 4 days (Patient not taking: Reported on 08/27/2021) 3 tablet 2   No current facility-administered medications for this visit.     Abtx:  Anti-infectives (From admission, onward)    None       REVIEW OF SYSTEMS:  Const: negative fever, negative chills, intentional  weight loss Eyes: dry eyes, uses restasis ,has had many surgeries to her eyes and vision not as sharp as before ENT: oral lesions, sore negative coryza, negative sore throat Resp:  cough, , dyspnea Cards: negative for chest pain, palpitations, lower extremity edema GU: negative for frequency, dysuria and hematuria GI: Negative for abdominal pain, diarrhea, bleeding, constipation Skin: negative for rash and pruritus Heme: negative for easy bruising and gum/nose bleeding MS: some weakness Neurolo:negative for headaches, dizziness, vertigo, memory problems  Psych: negative  for feelings of anxiety, depression  Endocrine: negative for thyroid, diabetes Allergy/Immunology- as above Objective:  VITALS:  BP 138/88   Pulse 83   Resp 16   Ht 5\' 6"  (1.676 m)   Wt 117 lb 3.2 oz (53.2 kg)   SpO2 99%   BMI 18.92 kg/m  PHYSICAL EXAM:  General: Alert, cooperative, no distress, appears stated age. Thin built Head: Normocephalic, without obvious abnormality, atraumatic. Eyes: Conjunctivae clear, anicteric sclerae. Pupils are equal ENT Nares normal. No drainage or sinus tenderness. Oral cavity- pin point erythematous spots over the soft palate   Neck: Supple, symmetrical, no adenopathy, thyroid: non  tender no carotid bruit and no JVD. Back: No CVA tenderness. Lungs: b/l air entry. Heart: Regular rate and rhythm, no murmur, rub or gallop. Abdomen: Soft, non-tender,not distended. Bowel sounds normal. No masses Extremities: atraumatic, no cyanosis. No edema. No clubbing Skin: No rashes or lesions. Or bruising Lymph: Cervical, supraclavicular normal. Neurologic: Grossly non-focal Pertinent Labs Lab Results CBC    Component Value Date/Time   WBC 6.0 08/08/2021 1327   RBC 4.46 08/08/2021 1327   HGB 12.9 08/08/2021 1327   HGB 13.1 09/12/2020 1549   HCT 40.9 08/08/2021 1327   HCT 39.2 09/12/2020 1549   PLT 205 08/08/2021 1327   PLT 218 09/12/2020 1549   MCV 91.7 08/08/2021 1327   MCV 89 09/12/2020 1549   MCH 28.9 08/08/2021 1327   MCHC 31.5 (L) 08/08/2021 1327   RDW 13.1 08/08/2021 1327   RDW 13.2 09/12/2020 1549   LYMPHSABS 1,080 08/08/2021 1327   EOSABS 12 (L) 08/08/2021 1327   BASOSABS 18 08/08/2021 1327    CMP Latest Ref Rng & Units 08/08/2021 11/20/2020 09/12/2020  Glucose 65 - 99 mg/dL 89 77 97  BUN 7 - 25 mg/dL 9 10 8   Creatinine 0.50 - 1.03 mg/dL 0.87 0.87 0.91  Sodium 135 - 146 mmol/L 141 140 142  Potassium 3.5 - 5.3 mmol/L 3.5 3.7 4.1  Chloride 98 - 110 mmol/L 108 107 108(H)  CO2 20 - 32 mmol/L 25 23 17(L)  Calcium 8.6 - 10.4 mg/dL 9.5  9.1 9.1  Total Protein 6.1 - 8.1 g/dL 7.0 6.7 -  Total Bilirubin 0.2 - 1.2 mg/dL 0.6 0.8 -  AST 10 - 35 U/L 17 19 -  ALT 6 - 29 U/L 16 13 -   ? Impression/Recommendation Oral lesions- pinpoint and erythematous- starts as while blisters- unclear etiology Less likely to be HSV / candida-  will get a viral culture ?? Mineral deficiency  ( Zn, Cu)secondary to gastric bypass and recent 35 pound weight loss-  ?? Abrasive effect from cough drops ( menthol/mint??) Could it be related to Nucala?? Is it part of dry eye/dry mouth syndrome ?? Check for sjogrens?/ Nothing to offer from ID perspective currently PCP can do HIV/HEPC with next blood draw( annula) if not done before May see derm/oral surgeon do biopsy May do work up for Sjogren/autoimmune  Asthma, bronchiectasis- has been on antibiotics  Nucala dicontinued  H/o gastric bypass sugery- keto diet with recent weight loss - may check minerals/vitamin level  Will let her know of the viral culture result She is not given a follow up appt Discussed the management in detail with the patient She will follow up with her PCP ? ?

## 2021-08-27 NOTE — Patient Instructions (Signed)
You are here for recurrent small oral ulceration and sorenes-s you have been treated with valtrex and diflucan with no relief Today will check a viral culture  You have been on ketodiet and has lost 18 pounds -( 117) and also you have been sucking on cough drops Need to r/o any deficiency of minerals including zinc Also avoid sucking on cough drops Ask your PCP to see whether testing for sjogrens is needed- if you have never done HIV/HEPC- you may want to get that test done. Will let you know once I have the viral culture back

## 2021-08-27 NOTE — Telephone Encounter (Signed)
I have no available appts today. I can see her when she gets back in town. ID notes reviewed.

## 2021-08-27 NOTE — Telephone Encounter (Signed)
Copied from CRM [id number]. Topic: General - Other >> Aug 27, 2021 12:41 PM Jaquita Rector A wrote: Reason for CRM: Patient called in wanting an appointment with Nicki Reaper states that she saw an infectious Disease specialist today who want her to do some lab work but states that she need to see her PCP for these tests. Per patient she is going out of town tomorrow and want to be seen today can be reached at Ph# [phone]

## 2021-09-02 ENCOUNTER — Other Ambulatory Visit: Payer: Self-pay | Admitting: Internal Medicine

## 2021-09-02 DIAGNOSIS — F32A Depression, unspecified: Secondary | ICD-10-CM

## 2021-09-02 NOTE — Telephone Encounter (Signed)
Requested Prescriptions  Pending Prescriptions Disp Refills  . busPIRone (BUSPAR) 5 MG tablet [Pharmacy Med Name: BUSPIRONE HCL 5 MG TAB] 90 tablet 0    Sig: TAKE 1 TABLET BY MOUTH 3 TIMES DAILY AS NEEDED     Psychiatry: Anxiolytics/Hypnotics - Non-controlled Passed - 09/02/2021 10:07 AM      Passed - Valid encounter within last 6 months    Recent Outpatient Visits          3 weeks ago [facility])   [facility], Salvadore Oxford, NP   3 months ago Oral thrush   [facility], [place] W, NP   5 months ago Bronchiectasis without complication [facility])   [facility], NP   5 months ago Bronchiectasis with acute exacerbation [facility])   [facility], NP   6 months ago Bronchiectasis without complication [facility])   [facility], Salvadore Oxford, NP      Future Appointments            Tomorrow Sam[place] Si, Salvadore Oxford, NP [facility]   In 2 months [place], Salvadore Oxford, NP [facility]

## 2021-09-03 ENCOUNTER — Telehealth: Payer: Self-pay

## 2021-09-03 ENCOUNTER — Ambulatory Visit (INDEPENDENT_AMBULATORY_CARE_PROVIDER_SITE_OTHER): Payer: BC Managed Care – PPO | Admitting: Internal Medicine

## 2021-09-03 ENCOUNTER — Other Ambulatory Visit: Payer: Self-pay

## 2021-09-03 ENCOUNTER — Encounter: Payer: Self-pay | Admitting: Internal Medicine

## 2021-09-03 VITALS — BP 143/92 | HR 87 | Temp 97.5°F | Resp 17 | Ht 66.0 in | Wt 115.8 lb

## 2021-09-03 DIAGNOSIS — K1379 Other lesions of oral mucosa: Secondary | ICD-10-CM

## 2021-09-03 DIAGNOSIS — R634 Abnormal weight loss: Secondary | ICD-10-CM

## 2021-09-03 DIAGNOSIS — F32A Depression, unspecified: Secondary | ICD-10-CM

## 2021-09-03 DIAGNOSIS — Z9884 Bariatric surgery status: Secondary | ICD-10-CM

## 2021-09-03 DIAGNOSIS — F419 Anxiety disorder, unspecified: Secondary | ICD-10-CM

## 2021-09-03 DIAGNOSIS — B370 Candidal stomatitis: Secondary | ICD-10-CM

## 2021-09-03 DIAGNOSIS — E559 Vitamin D deficiency, unspecified: Secondary | ICD-10-CM | POA: Diagnosis not present

## 2021-09-03 MED ORDER — BUSPIRONE HCL 5 MG PO TABS: 5.0000 mg | ORAL_TABLET | Freq: Three times a day (TID) | ORAL | 2 refills | Status: DC | PRN

## 2021-09-03 NOTE — Telephone Encounter (Signed)
Advised patient that culture shows no virus and aske dthat she follo wup with her PCP. She understood and thanks our office.

## 2021-09-03 NOTE — Patient Instructions (Signed)
Oral Ulcers °Oral ulcers are small sores inside the mouth or near the mouth. They may occur on or inside the lips, inside the cheeks, on the tongue, or anywhere else inside or near the mouth. They may be called canker sores or cold sores, which are two types of oral ulcers. Many oral ulcers are harmless and go away on their own. In some cases, oral ulcers may require medical care to determine the cause and proper treatment. °What are the causes? °Common causes of this condition include: °Infections caused by viruses, bacteria, or fungi. °Emotional stress. °Foods or chemicals that irritate the mouth. °Injury or physical irritation of the mouth. °Medicines. °Allergies. °Tobacco use. °Less common causes include: °Skin disease. °A type of herpes virus infection (herpes simplex or herpes zoster). °Oral cancer. °In some cases, the cause may not be known. °What increases the risk? °You are more likely to develop this condition if: °You wear dental braces, dentures, or retainers. °You have poor oral hygiene. °You have sensitive skin. °You have a condition that affects the entire body (systemic condition), such as an immune disorder. °What are the signs or symptoms? °The main symptom of this condition is having one or more oval-shaped or round ulcers that have red borders. Symptoms may vary depending on the cause. This includes: °Location of the ulcers. Ulcers may be found inside the mouth, on the gums, or on the insides of the lips or cheeks. They may also be found on the lips or on skin that is near the mouth, such as the cheeks or chin. °Pain. Ulcers can be painful and uncomfortable, or they can be painless. °Appearance of the ulcers. They may look like red blisters and be filled with fluid, or they may be white or yellow patches. °Frequency of outbreaks. Ulcers may go away permanently after one outbreak, or they may come back (recur) often or rarely. °How is this diagnosed? °This condition is diagnosed with a physical  exam. Your health care provider may ask you questions about your lifestyle and your medical history. You may have tests, including: °Blood tests. °Removal of a small number of cells from an ulcer to be examined under a microscope (biopsy). °How is this treated? °Treatment depends on the severity and cause of the condition. Oral ulcers often go away on their own in 1-2 weeks. Treatment may include medicines, such as: °Medicines to treat a viral infection (antivirals), a bacterial infection (antibiotics), or a fungal infection (antifungals). °Medicines to help control pain. This may include: °Over-the-counter pain medicines. °Gel, cream, or spray to numb the area (topical anesthetic) if you have severe pain. °Other medicines to coat or numb your mouth. °Follow these instructions at home: °Medicines °Take or use over-the-counter and prescription medicines only as told by your health care provider. °If you were prescribed an antibiotic medicine, take it as told by your health care provider. Do not stop taking the antibiotic even if you start to feel better. °Do not use products that contain benzocaine (including numbing gels) to treat teething or mouth pain in children who are younger than 2 years. These products may cause a rare but serious blood condition. °Eating and drinking °Eat a balanced diet. Do not eat: °Spicy foods. °Citrus, such as oranges. °Other foods and drinks that you think may cause or irritate your ulcers. °Drink enough fluid to keep your urine pale yellow. °Avoid drinking alcohol. °Lifestyle ° °Practice good oral hygiene: °Gently brush your teeth with a soft toothbrush two times a day. °Floss   your teeth every day. Get regular dental cleanings and checkups. Do not use any products that contain nicotine or tobacco, such as cigarettes and e-cigarettes. If you need help quitting, ask your health care provider. Managing pain If directed, put ice on your face in the affected area to help reduce  pain. Put ice in a plastic bag. Place a towel between your skin and the bag. Leave the ice on for 20 minutes, 2-3 times a day. Avoid physical or chemical irritants that may have caused the ulcers or made them worse, such as mouthwashes that contain alcohol (ethanol). If you wear dental braces, dentures, or retainers, work with your health care provider to make sure these devices are fitted correctly. If you were prescribed a prescription mouthwash to help reduce pain in your mouth, use it as told by your health care provider. General instructions Rinse with a salt-water mixture 3-4 times a day or as told by your health care provider. To make a salt-water mixture, completely dissolve -1 tsp (3-6 g) of salt in 1 cup (237 mL) of warm water. Keep all follow-up visits as told by your health care provider. This is important. Contact a health care provider if: You have: Pain that gets worse or does not get better with medicine. Four or more ulcers at one time. A fever. New ulcers that look or feel different from other ulcers you have. Inflammation in one eye or both eyes. Ulcers that do not go away after 10 days. You develop new symptoms in your mouth, such as: Bleeding or crusting around your lips or gums. Tooth pain. Difficulty swallowing. You develop symptoms on your skin or genitals, such as: A rash or blisters. Burning or itching sensations. Your ulcers begin or get worse after you start a new medicine. Get help right away if you have: Difficulty breathing. Swelling in your face or neck. Excessive bleeding from your mouth. Severe pain. Summary Oral ulcers may occur anywhere inside or near the mouth. They can be caused by many things, such as infections, stress, injury or irritation, or tobacco use. Oral ulcers can be painful or painless. Treatment may include medicines to relieve pain or to treat an infection (if appropriate). Most oral ulcers go away in 1-2 weeks. This information  is not intended to replace advice given to you by your health care provider. Make sure you discuss any questions you have with your health care provider. Document Revised: 04/18/2021 Document Reviewed: 02/25/2018 Elsevier Patient Education  2022 [employer].

## 2021-09-03 NOTE — Progress Notes (Signed)
Subjective:    Patient ID: Jennifer Hubbard, female    DOB: [birth year], 60 y.o.   MRN: [id number]  HPI  Pt presents to the clinic today for follow up of infectious disease clinic. She was seen there for recurrent oral ulcers, oral thrush and unintentional weight loss. She has had a completely negative GI workup in the past. She has seen ENT and pulmonology for the same. ID was concerned it could be a vitamin/mineral deficiency secondary to her previous gastric bypass vs autoimmune disorder such as Sjogren's, Bechet's vs medication related. They recommend she have further workup by PCP. They also recommended possible referral to oral surgery for biopsy.   Review of Systems  Past Medical History:  Diagnosis Date   Allergy    Anemia    Asthma    Bronchiectasis (HCC)    Cervical dystonia    Depression    IBS (irritable bowel syndrome) 09/10/2020   Sleep apnea     Current Outpatient Medications  Medication Sig Dispense Refill   albuterol (VENTOLIN HFA) 108 (90 Base) MCG/ACT inhaler Inhale into the lungs every 6 (six) hours as needed for wheezing or shortness of breath.     busPIRone (BUSPAR) 5 MG tablet TAKE 1 TABLET BY MOUTH 3 TIMES DAILY AS NEEDED 90 tablet 0   Cholecalciferol (VITAMIN D3) 1.25 MG (50000 UT) CAPS Take by mouth.     cycloSPORINE (RESTASIS) 0.05 % ophthalmic emulsion Administer 1 drop to both eyes Two (2) times a day.     estradiol-norethindrone (ACTIVELLA) 1-0.5 MG tablet TAKE 1 TABLET BY MOUTH ONCE DAILY 90 tablet 1   fluconazole (DIFLUCAN) 200 MG tablet Take 200 mg PO x 1 on day then 100 mg PO daily x 4 days (Patient not taking: Reported on 08/27/2021) 3 tablet 2   fluticasone (VERAMYST) 27.5 MCG/SPRAY nasal spray Place 2 sprays into the nose daily.     loratadine (CLARITIN) 10 MG tablet Take 10 mg by mouth daily.     mometasone-formoterol (DULERA) 200-5 MCG/ACT AERO Inhale into the lungs.     montelukast (SINGULAIR) 10 MG tablet Take 10 mg by mouth at bedtime.      Multiple Vitamin (MULTIVITAMIN) capsule Take 1 capsule by mouth daily.     Omega-3 Fatty Acids (FISH OIL) 1000 MG CAPS Take by mouth.     omeprazole (PRILOSEC) 40 MG capsule Take 1 capsule (40 mg total) by mouth daily. 90 capsule 1   sertraline (ZOLOFT) 100 MG tablet TAKE 1 TABLET BY MOUTH ONCE DAILY 90 tablet 1   vitamin E 180 MG (400 UNITS) capsule Take 400 Units by mouth daily.     No current facility-administered medications for this visit.    Allergies  Allergen Reactions   Cephalexin Other (See Comments)    Other reaction(s): Abdominal Pain Other reaction(s): Abdominal Pain    Erythromycin     nausea   Latex Other (See Comments)    Unsure if allergic   Penicillins     "childhood allergy"   Sulfamethoxazole-Trimethoprim Rash    Family History  Problem Relation Age of Onset   Atrial fibrillation Mother    Heart disease Mother    Lung cancer Father    Stroke Maternal Grandmother    Breast cancer Maternal Aunt    Breast cancer Paternal Grandmother     Social History   Socioeconomic History   Marital status: Married    Spouse name: Not on file   Number of children: Not on file  Years of education: Not on file   Highest education level: Not on file  Occupational History   Not on file  Tobacco Use   Smoking status: Never   Smokeless tobacco: Never  Vaping Use   Vaping Use: Never used  Substance and Sexual Activity   Alcohol use: Never   Drug use: Never   Sexual activity: Yes    Birth control/protection: None    Comment: Married  Other Topics Concern   Not on file  Social History Narrative   Not on file   Social Determinants of Health   Financial Resource Strain: Not on file  Food Insecurity: Not on file  Transportation Needs: Not on file  Physical Activity: Not on file  Stress: Not on file  Social Connections: Not on file  Intimate Partner Violence: Not on file     Constitutional: Pt reports unintentional weight loss. Denies fever, malaise,  fatigue, headache.  HEENT: Pt reports recurrent oral ulcers, oral  thrush. Denies eye pain, eye redness, ear pain, ringing in the ears, wax buildup, runny nose, nasal congestion, bloody nose, or sore throat. Respiratory: Denies difficulty breathing, shortness of breath, cough or sputum production.   Cardiovascular: Denies chest pain, chest tightness, palpitations or swelling in the hands or feet.  Gastrointestinal: Denies abdominal pain, bloating, constipation, diarrhea or blood in the stool.  Skin: Denies redness, rashes, lesions or ulcercations.   No other specific complaints in a complete review of systems (except as listed in HPI above).     Objective:   Physical Exam   BP (!) 143/92 (BP Location: Right Arm, Patient Position: Sitting, Cuff Size: Small)   Pulse 87   Temp (!) 97.5 F (36.4 C) (Temporal)   Resp 17   Ht '5\' 6"'  (1.676 m)   Wt 115 lb 12.8 oz (52.5 kg)   SpO2 100%   BMI 18.69 kg/m   Wt Readings from Last 3 Encounters:  08/27/21 117 lb 3.2 oz (53.2 kg)  08/08/21 113 lb 9.6 oz (51.5 kg)  06/06/21 120 lb (54.4 kg)    General: Appears her stated age, well developed, well nourished in NAD. Skin: Warm, dry and intact.  HEENT: Head: normal shape and size; Throat/Mouth: Teeth present, mucosa pink and moist, no exudate, lesions or ulcerations noted.  Cardiovascular: Normal rate. Pulmonary/Chest: Normal effort. Neurological: Alert and oriented.   BMET    Component Value Date/Time   NA 141 08/08/2021 1327   NA 142 09/12/2020 1549   K 3.5 08/08/2021 1327   CL 108 08/08/2021 1327   CO2 25 08/08/2021 1327   GLUCOSE 89 08/08/2021 1327   BUN 9 08/08/2021 1327   BUN 8 09/12/2020 1549   CREATININE 0.87 08/08/2021 1327   CALCIUM 9.5 08/08/2021 1327   GFRNONAA 74 11/20/2020 0959   GFRAA 86 11/20/2020 0959    Lipid Panel     Component Value Date/Time   CHOL 186 11/20/2020 0959   TRIG 62 11/20/2020 0959   HDL 53 11/20/2020 0959   CHOLHDL 3.5 11/20/2020 0959    LDLCALC 118 (H) 11/20/2020 0959    CBC    Component Value Date/Time   WBC 6.0 08/08/2021 1327   RBC 4.46 08/08/2021 1327   HGB 12.9 08/08/2021 1327   HGB 13.1 09/12/2020 1549   HCT 40.9 08/08/2021 1327   HCT 39.2 09/12/2020 1549   PLT 205 08/08/2021 1327   PLT 218 09/12/2020 1549   MCV 91.7 08/08/2021 1327   MCV 89 09/12/2020 1549  MCH 28.9 08/08/2021 1327   MCHC 31.5 (L) 08/08/2021 1327   RDW 13.1 08/08/2021 1327   RDW 13.2 09/12/2020 1549   LYMPHSABS 1,080 08/08/2021 1327   EOSABS 12 (L) 08/08/2021 1327   BASOSABS 18 08/08/2021 1327    Hgb A1C No results found for: HGBA1C         Assessment & Plan:   Recurrent Oral Ulcers, Oral Thrush, Unintentional Weight Loss:  Will check CBC, CMET, TSH, Vit D, B12, Folate, Copper, Zinc ANA, ESR, CRP, RF, Sjogrens antibodies Will check HIV and Hep C today  Will follow up after labs are back with further recommendations and treatment plan Webb Silversmith, NP This visit occurred during the SARS-CoV-2 public health emergency.  Safety protocols were in place, including screening questions prior to the visit, additional usage of staff PPE, and extensive cleaning of exam room while observing appropriate contact time as indicated for disinfecting solutions.

## 2021-09-04 LAB — CBC
HCT: 39.1 % (ref 35.0–45.0)
Hemoglobin: 12.4 g/dL (ref 11.7–15.5)
MCH: 28.9 pg (ref 27.0–33.0)
MCHC: 31.7 g/dL — ABNORMAL LOW (ref 32.0–36.0)
MCV: 91.1 fL (ref 80.0–100.0)
MPV: 11.1 fL (ref 7.5–12.5)
Platelets: 174 10*3/uL (ref 140–400)
RBC: 4.29 10*6/uL (ref 3.80–5.10)
RDW: 13.7 % (ref 11.0–15.0)
WBC: 4.9 10*3/uL (ref 3.8–10.8)

## 2021-09-04 LAB — VIRUS CULTURE

## 2021-09-04 LAB — ADVANCED WRITTEN NOTIFICATION (AWN) TEST REFUSAL

## 2021-09-04 LAB — TSH: TSH: 3.13 mIU/L (ref 0.40–4.50)

## 2021-09-04 LAB — HIV ANTIBODY (ROUTINE TESTING W REFLEX): HIV 1&2 Ab, 4th Generation: NONREACTIVE

## 2021-09-05 LAB — COMPLETE METABOLIC PANEL WITH GFR
AG Ratio: 1.6 (calc) (ref 1.0–2.5)
ALT: 16 U/L (ref 6–29)
AST: 20 U/L (ref 10–35)
Albumin: 4.2 g/dL (ref 3.6–5.1)
Alkaline phosphatase (APISO): 48 U/L (ref 37–153)
BUN: 8 mg/dL (ref 7–25)
CO2: 24 mmol/L (ref 20–32)
Calcium: 9.2 mg/dL (ref 8.6–10.4)
Chloride: 109 mmol/L (ref 98–110)
Creat: 0.84 mg/dL (ref 0.50–1.03)
Globulin: 2.7 g/dL (calc) (ref 1.9–3.7)
Glucose, Bld: 91 mg/dL (ref 65–139)
Potassium: 3.7 mmol/L (ref 3.5–5.3)
Sodium: 141 mmol/L (ref 135–146)
Total Bilirubin: 0.6 mg/dL (ref 0.2–1.2)
Total Protein: 6.9 g/dL (ref 6.1–8.1)
eGFR: 80 mL/min/{1.73_m2} (ref >=60)

## 2021-09-05 LAB — SJOGREN'S SYNDROME ANTIBODS(SSA + SSB)
SSA (Ro) (ENA) Antibody, IgG: <1 AI
SSB (La) (ENA) Antibody, IgG: <1 AI

## 2021-09-05 LAB — COPPER, SERUM: Copper: 112 ug/dL (ref 70–175)

## 2021-09-05 LAB — RHEUMATOID FACTOR: Rheumatoid fact SerPl-aCnc: <14 IU/mL (ref <14)

## 2021-09-05 LAB — ANA: Anti Nuclear Antibody (ANA): NEGATIVE

## 2021-09-05 LAB — ZINC: Zinc: 71 ug/dL (ref 60–130)

## 2021-09-05 LAB — C-REACTIVE PROTEIN: CRP: 3.3 mg/L (ref <8.0)

## 2021-09-05 LAB — SEDIMENTATION RATE: Sed Rate: 6 mm/h (ref 0–30)

## 2021-09-12 ENCOUNTER — Ambulatory Visit: Payer: Self-pay | Admitting: *Deleted

## 2021-09-12 NOTE — Telephone Encounter (Signed)
Pt called in c/o a sore throat, swollen tonsils, right ear pain, sinus drainage.  No fever or body aches.  Requesting an appt.  I attempted to return her call.  I left a voicemail for her to call [facility] along with the number.  She then called right back.        Reason for Disposition  [1] SEVERE sore throat AND [2] present > 24 hours  Answer Assessment - Initial Assessment Questions 1. ONSET: "When did the nasal discharge start?"      I returned her call.   She has tried allergy medicine. 3 days ago with a scratchy throat.    Sinus drainage started then too.   It's mostly going down the back of my throat.   2. AMOUNT: "How much discharge is there?"      When I spit mucus out it's greenish-yellow.   I clear my throat a lot.   I'm drinking hot tea and using cough drops but not helping. 3. COUGH: "Do you have a cough?" If yes, ask: "Describe the color of your sputum" (clear, white, yellow, green)     No 4. RESPIRATORY DISTRESS: "Describe your breathing."      No shortness of breath 5. FEVER: "Do you have a fever?" If Yes, ask: "What is your temperature, how was it measured, and when did it start?"     No fever I think it's the change of season. 6. SEVERITY: "Overall, how bad are you feeling right now?" (e.g., doesn't interfere with normal activities, staying home from school/work, staying in bed)      I'm ok   7. OTHER SYMPTOMS: "Do you have any other symptoms?" (e.g., sore throat, earache, wheezing, vomiting)     Sore throat, right tonsil swollen, no pus in throat, right ear pain, sinus drainage down back of throat. 8. PREGNANCY: "Is there any chance you are pregnant?" "When was your last menstrual period?"     N/A due to age  Protocols used: Common Cold-A-AH

## 2021-09-12 NOTE — Telephone Encounter (Signed)
Pt called back right after I attempted to call her.  She is c/o a sore throat, right tonsil being swollen, post nasal drainage that is causing her to spit up greenish yellow mucus, right earache.    No fever or body aches.   See triage notes.  There were no appts available within the 24 hr timeframe indicated on the protocol.   I offered her a virtual care option on the  web site however her husband's insurance plan offers video visits at no cost and she is on his plan so she's decided to do that video visit instead of through the [facility] web site   I let her know that was a good idea since it's no cost and she's on his plan.  She asked me to go a head and make an appt with Nicki Reaper, NP because she wants to discuss some lab tests she can't afford to have done by another provider.  It's for 09/27/2021 at 1:20 in office visit.  She mentioned she had 3 tests ordered by another doctor however her insurance would not cover the costs of the tests so she did not have them done.   "In my chart it looks like I refused the tests but I didn't, I just could not have them done because of the cost and my insurance not covering the tests".     I had a question about her bill so I gave her the billing number.

## 2021-09-13 NOTE — Telephone Encounter (Signed)
Noted, will discuss with her at upcoming appointment

## 2021-09-23 DIAGNOSIS — G243 Spasmodic torticollis: Secondary | ICD-10-CM | POA: Diagnosis not present

## 2021-09-23 DIAGNOSIS — Z881 Allergy status to other antibiotic agents status: Secondary | ICD-10-CM | POA: Diagnosis not present

## 2021-09-23 DIAGNOSIS — Z880 Allergy status to penicillin: Secondary | ICD-10-CM | POA: Diagnosis not present

## 2021-09-25 ENCOUNTER — Telehealth: Payer: Self-pay | Admitting: Internal Medicine

## 2021-09-25 NOTE — Telephone Encounter (Signed)
Jennifer Hubbard from [place] is needing billing codes to verify that the pt lab orders will be covered. Jennifer Hubbard said she called billing department but they transferred her to another department and she was not able to get any help. Jennifer Hubbard from [place] asked if someone will reach to pt to give codes to her. [employer] customer care number [phone]

## 2021-09-25 NOTE — Telephone Encounter (Signed)
I do not know what labs patient is needing or the CPT codes that will be used.   I will forward this to our billing/coding team to see if this is something they can reach out and handle.    Dawn,  Please advise if you are able to provide patients insurance any clarity on this request.   Thanks.

## 2021-09-25 NOTE — Telephone Encounter (Signed)
Please advise patient that this will be given to her at her appt

## 2021-09-25 NOTE — Telephone Encounter (Signed)
Upon further research, patient is being followed at [facility] and not by [facility].   Will forward to her provider there to pass along to their team to investigate.   Rene Kocher,  I was not sure who your CMA or practice admin is now to help with this.  Please forward to them as appropriate to investigate.   Thanks.

## 2021-09-27 ENCOUNTER — Ambulatory Visit: Payer: BC Managed Care – PPO | Admitting: Internal Medicine

## 2021-10-03 DIAGNOSIS — Z792 Long term (current) use of antibiotics: Secondary | ICD-10-CM | POA: Diagnosis not present

## 2021-10-03 DIAGNOSIS — J3489 Other specified disorders of nose and nasal sinuses: Secondary | ICD-10-CM | POA: Diagnosis not present

## 2021-10-03 DIAGNOSIS — J479 Bronchiectasis, uncomplicated: Secondary | ICD-10-CM | POA: Diagnosis not present

## 2021-10-03 DIAGNOSIS — Z79899 Other long term (current) drug therapy: Secondary | ICD-10-CM | POA: Diagnosis not present

## 2021-10-03 DIAGNOSIS — K120 Recurrent oral aphthae: Secondary | ICD-10-CM | POA: Diagnosis not present

## 2021-10-03 DIAGNOSIS — Z23 Encounter for immunization: Secondary | ICD-10-CM | POA: Diagnosis not present

## 2021-10-03 DIAGNOSIS — Z7951 Long term (current) use of inhaled steroids: Secondary | ICD-10-CM | POA: Diagnosis not present

## 2021-10-03 DIAGNOSIS — J45909 Unspecified asthma, uncomplicated: Secondary | ICD-10-CM | POA: Diagnosis not present

## 2021-10-03 DIAGNOSIS — J4540 Moderate persistent asthma, uncomplicated: Secondary | ICD-10-CM | POA: Diagnosis not present

## 2021-10-04 ENCOUNTER — Telehealth: Payer: Self-pay | Admitting: Internal Medicine

## 2021-10-04 NOTE — Telephone Encounter (Signed)
Patient called wants to speak with practice admiistrator about coding issues with for bills for labs Please call back

## 2021-10-08 DIAGNOSIS — E559 Vitamin D deficiency, unspecified: Secondary | ICD-10-CM | POA: Diagnosis not present

## 2021-10-08 DIAGNOSIS — Z1159 Encounter for screening for other viral diseases: Secondary | ICD-10-CM | POA: Diagnosis not present

## 2021-10-08 DIAGNOSIS — D528 Other folate deficiency anemias: Secondary | ICD-10-CM | POA: Diagnosis not present

## 2021-10-08 DIAGNOSIS — D513 Other dietary vitamin B12 deficiency anemia: Secondary | ICD-10-CM | POA: Diagnosis not present

## 2021-10-09 LAB — HEPATITIS C ANTIBODY
Hepatitis C Ab: NONREACTIVE
SIGNAL TO CUT-OFF: 0.05 (ref <1.00)

## 2021-10-09 LAB — FOLATE: Folate: >24 ng/mL

## 2021-10-09 LAB — VITAMIN B12: Vitamin B-12: 456 pg/mL (ref 200–1100)

## 2021-10-09 LAB — VITAMIN D 25 HYDROXY (VIT D DEFICIENCY, FRACTURES): Vit D, 25-Hydroxy: 67 ng/mL (ref 30–100)

## 2021-11-04 ENCOUNTER — Encounter: Payer: BC Managed Care – PPO | Admitting: Internal Medicine

## 2021-12-05 ENCOUNTER — Other Ambulatory Visit: Payer: Self-pay

## 2021-12-05 ENCOUNTER — Ambulatory Visit (INDEPENDENT_AMBULATORY_CARE_PROVIDER_SITE_OTHER): Payer: BC Managed Care – PPO | Admitting: Internal Medicine

## 2021-12-05 ENCOUNTER — Encounter: Payer: Self-pay | Admitting: Internal Medicine

## 2021-12-05 ENCOUNTER — Other Ambulatory Visit (HOSPITAL_COMMUNITY)
Admission: RE | Admit: 2021-12-05 | Discharge: 2021-12-05 | Disposition: A | Discharge status: Home / Self Care | Payer: BC Managed Care – PPO | Source: Ambulatory Visit | Attending: Internal Medicine | Admitting: Internal Medicine

## 2021-12-05 VITALS — BP 142/76 | HR 93 | Temp 98.2°F | Ht 66.0 in | Wt 119.0 lb

## 2021-12-05 DIAGNOSIS — Z124 Encounter for screening for malignant neoplasm of cervix: Secondary | ICD-10-CM

## 2021-12-05 DIAGNOSIS — Z1231 Encounter for screening mammogram for malignant neoplasm of breast: Secondary | ICD-10-CM

## 2021-12-05 DIAGNOSIS — Z0000 Encounter for general adult medical examination without abnormal findings: Secondary | ICD-10-CM | POA: Diagnosis not present

## 2021-12-05 DIAGNOSIS — Z780 Asymptomatic menopausal state: Secondary | ICD-10-CM | POA: Diagnosis not present

## 2021-12-05 DIAGNOSIS — Z136 Encounter for screening for cardiovascular disorders: Secondary | ICD-10-CM | POA: Diagnosis not present

## 2021-12-05 LAB — COMPLETE METABOLIC PANEL WITH GFR
AG Ratio: 1.5 (calc) (ref 1.0–2.5)
ALT: 16 U/L (ref 6–29)
AST: 19 U/L (ref 10–35)
Albumin: 4 g/dL (ref 3.6–5.1)
Alkaline phosphatase (APISO): 55 U/L (ref 37–153)
BUN: 11 mg/dL (ref 7–25)
CO2: 25 mmol/L (ref 20–32)
Calcium: 9.3 mg/dL (ref 8.6–10.4)
Chloride: 107 mmol/L (ref 98–110)
Creat: 0.98 mg/dL (ref 0.50–1.03)
Globulin: 2.6 g/dL (calc) (ref 1.9–3.7)
Glucose, Bld: 88 mg/dL (ref 65–99)
Potassium: 3.7 mmol/L (ref 3.5–5.3)
Sodium: 140 mmol/L (ref 135–146)
Total Bilirubin: 0.7 mg/dL (ref 0.2–1.2)
Total Protein: 6.6 g/dL (ref 6.1–8.1)
eGFR: 67 mL/min/{1.73_m2} (ref >=60)

## 2021-12-05 LAB — LIPID PANEL
Cholesterol: 181 mg/dL (ref <200)
HDL: 53 mg/dL (ref >=50)
LDL Cholesterol (Calc): 115 mg/dL (calc) — ABNORMAL HIGH
Non-HDL Cholesterol (Calc): 128 mg/dL (calc) (ref <130)
Total CHOL/HDL Ratio: 3.4 (calc) (ref <5.0)
Triglycerides: 45 mg/dL (ref <150)

## 2021-12-05 LAB — CBC
HCT: 37.4 % (ref 35.0–45.0)
Hemoglobin: 12 g/dL (ref 11.7–15.5)
MCH: 28.7 pg (ref 27.0–33.0)
MCHC: 32.1 g/dL (ref 32.0–36.0)
MCV: 89.5 fL (ref 80.0–100.0)
MPV: 10.9 fL (ref 7.5–12.5)
Platelets: 190 10*3/uL (ref 140–400)
RBC: 4.18 10*6/uL (ref 3.80–5.10)
RDW: 13.1 % (ref 11.0–15.0)
WBC: 5 10*3/uL (ref 3.8–10.8)

## 2021-12-05 LAB — TEST AUTHORIZATION

## 2021-12-05 MED ORDER — BUSPIRONE HCL 10 MG PO TABS: 10.0000 mg | ORAL_TABLET | Freq: Two times a day (BID) | ORAL | 1 refills | Status: DC

## 2021-12-05 NOTE — Progress Notes (Signed)
Subjective:    Patient ID: Jennifer Hubbard, female    DOB: 11-29-1960, 61 y.o.   MRN: [id number]  HPI  Patient presents the clinic today for her annual exam.  Flu: 07/2021 Tetanus: 09/2013 Pneumovax: 10/2012 Prevnar: 09/2021 COVID: Never Zostavax: 12/2015 Shingrix: had it at [facility] Pap smear: 10/2020, abnormal Mammogram: 08/2020 Bone density: Never Colon screening: 11/2020, Cologuard Vision screening: annually Dentist: biannually  Diet: She does eat meat. She consumes fruits and veggies. She tries to avoid fried foods. She drinks mostly water with lemon, hot tea. Exercise: Resistance exercised 2-3 times per week  Review of Systems  Past Medical History:  Diagnosis Date   Allergy    Anemia    Asthma    Bronchiectasis (HCC)    Cervical dystonia    Depression    IBS (irritable bowel syndrome) 09/10/2020   Sleep apnea     Current Outpatient Medications  Medication Sig Dispense Refill   albuterol (VENTOLIN HFA) 108 (90 Base) MCG/ACT inhaler Inhale into the lungs every 6 (six) hours as needed for wheezing or shortness of breath.     busPIRone (BUSPAR) 5 MG tablet Take 1 tablet (5 mg total) by mouth 3 (three) times daily as needed. 270 tablet 2   Cholecalciferol (VITAMIN D3) 1.25 MG (50000 UT) CAPS Take by mouth.     cycloSPORINE (RESTASIS) 0.05 % ophthalmic emulsion Administer 1 drop to both eyes Two (2) times a day.     estradiol-norethindrone (ACTIVELLA) 1-0.5 MG tablet TAKE 1 TABLET BY MOUTH ONCE DAILY 90 tablet 1   fluticasone (VERAMYST) 27.5 MCG/SPRAY nasal spray Place 2 sprays into the nose daily.     loratadine (CLARITIN) 10 MG tablet Take 10 mg by mouth daily.     mometasone-formoterol (DULERA) 200-5 MCG/ACT AERO Inhale into the lungs.     montelukast (SINGULAIR) 10 MG tablet Take 10 mg by mouth at bedtime.     Multiple Vitamin (MULTIVITAMIN) capsule Take 1 capsule by mouth daily.     Omega-3 Fatty Acids (FISH OIL) 1000 MG CAPS Take by mouth.     omeprazole  (PRILOSEC) 40 MG capsule Take 1 capsule (40 mg total) by mouth daily. 90 capsule 1   sertraline (ZOLOFT) 100 MG tablet TAKE 1 TABLET BY MOUTH ONCE DAILY 90 tablet 1   vitamin E 180 MG (400 UNITS) capsule Take 400 Units by mouth daily.     No current facility-administered medications for this visit.    Allergies  Allergen Reactions   Cephalexin Other (See Comments)    Other reaction(s): Abdominal Pain Other reaction(s): Abdominal Pain    Erythromycin     nausea   Latex Other (See Comments)    Unsure if allergic   Penicillins     "childhood allergy"   Sulfamethoxazole-Trimethoprim Rash    Family History  Problem Relation Age of Onset   Atrial fibrillation Mother    Heart disease Mother    Lung cancer Father    Stroke Maternal Grandmother    Breast cancer Maternal Aunt    Breast cancer Paternal Grandmother     Social History   Socioeconomic History   Marital status: Married    Spouse name: Not on file   Number of children: Not on file   Years of education: Not on file   Highest education level: Not on file  Occupational History   Not on file  Tobacco Use   Smoking status: Never   Smokeless tobacco: Never  Vaping Use   Vaping  Use: Never used  Substance and Sexual Activity   Alcohol use: Never   Drug use: Never   Sexual activity: Yes    Birth control/protection: None    Comment: Married  Other Topics Concern   Not on file  Social History Narrative   Not on file   Social Determinants of Health   Financial Resource Strain: Not on file  Food Insecurity: Not on file  Transportation Needs: Not on file  Physical Activity: Not on file  Stress: Not on file  Social Connections: Not on file  Intimate Partner Violence: Not on file     Constitutional: Denies fever, malaise, fatigue, headache or abrupt weight changes.  HEENT: Pt reports post nasal drip. Denies eye pain, eye redness, ear pain, ringing in the ears, wax buildup, runny nose, nasal congestion, bloody  nose, or sore throat. Respiratory: Denies difficulty breathing, shortness of breath, cough or sputum production.   Cardiovascular: Denies chest pain, chest tightness, palpitations or swelling in the hands or feet.  Gastrointestinal: Denies abdominal pain, bloating, constipation, diarrhea or blood in the stool.  GU: Denies urgency, frequency, pain with urination, burning sensation, blood in urine, odor or discharge. Musculoskeletal: Denies decrease in range of motion, difficulty with gait, muscle pain or joint pain and swelling.  Skin: Denies redness, rashes, lesions or ulcercations.  Neurological: Denies dizziness, difficulty with memory, difficulty with speech or problems with balance and coordination.  Psych: Patient has a history of anxiety and depression.  Denies SI/HI.  No other specific complaints in a complete review of systems (except as listed in HPI above).     Objective:   Physical Exam  BP (!) 142/76 (BP Location: Left Arm, Patient Position: Sitting, Cuff Size: Normal)    Pulse 93    Temp 98.2 F (36.8 C) (Oral)    Ht '5\' 6"'  (1.676 m)    Wt 119 lb (54 kg)    SpO2 100%    BMI 19.21 kg/m   Wt Readings from Last 3 Encounters:  09/03/21 115 lb 12.8 oz (52.5 kg)  08/27/21 117 lb 3.2 oz (53.2 kg)  08/08/21 113 lb 9.6 oz (51.5 kg)    General: Appears her stated age, well developed, well nourished in NAD. Skin: Warm, dry and intact.  HEENT: Head: normal shape and size; Eyes: sclera white and EOMs intact;  Neck:  Neck supple, trachea midline. No masses, lumps or thyromegaly present.  Cardiovascular: Normal rate and rhythm. S1,S2 noted.  No murmur, rubs or gallops noted. No JVD or BLE edema. No carotid bruits noted. Pulmonary/Chest: Normal effort and positive vesicular breath sounds. No respiratory distress. No wheezes, rales or ronchi noted.  Abdomen: Soft and nontender. Normal bowel sounds.  Pelvic: Normal female anatomy.  Cervix without mass or lesion noted.  No significant  vaginal discharge or odor present.  No CMT.  Adnexa nonpalpable. Musculoskeletal: Strength 5/5 BUE/BLE.  No difficulty with gait.  Neurological: Alert and oriented. Cranial nerves II-XII grossly intact. Coordination normal.  Psychiatric: Mood and affect normal. Behavior is normal. Judgment and thought content normal.    BMET    Component Value Date/Time   NA 141 09/03/2021 0929   NA 142 09/12/2020 1549   K 3.7 09/03/2021 0929   CL 109 09/03/2021 0929   CO2 24 09/03/2021 0929   GLUCOSE 91 09/03/2021 0929   BUN 8 09/03/2021 0929   BUN 8 09/12/2020 1549   CREATININE 0.84 09/03/2021 0929   CALCIUM 9.2 09/03/2021 0929   GFRNONAA 74 11/20/2020  [place] 11/20/2020 0959    Lipid Panel     Component Value Date/Time   CHOL 186 11/20/2020 0959   TRIG 62 11/20/2020 0959   HDL 53 11/20/2020 0959   CHOLHDL 3.5 11/20/2020 0959   LDLCALC 118 (H) 11/20/2020 0959    CBC    Component Value Date/Time   WBC 4.9 09/03/2021 0933   RBC 4.29 09/03/2021 0933   HGB 12.4 09/03/2021 0933   HGB 13.1 09/12/2020 1549   HCT 39.1 09/03/2021 0933   HCT 39.2 09/12/2020 1549   PLT 174 09/03/2021 0933   PLT 218 09/12/2020 1549   MCV 91.1 09/03/2021 0933   MCV 89 09/12/2020 1549   MCH 28.9 09/03/2021 0933   MCHC 31.7 (L) 09/03/2021 0933   RDW 13.7 09/03/2021 0933   RDW 13.2 09/12/2020 1549   LYMPHSABS 1,080 08/08/2021 1327   EOSABS 12 (L) 08/08/2021 1327   BASOSABS 18 08/08/2021 1327    Hgb A1C No results found for: HGBA1C        Assessment & Plan:   Preventative Health Maintenance:  Flu shot UTD Tetanus UTD Prevnar UTD Pneumovax due Encouraged her to get her COVID-vaccine Zostavax UTD Shingrix UTD per her report Pap smear UTD Mammogram and bone density ordered-she will call to schedule Colon screening UTD Encouraged her to consume a balanced diet and exercise regimen Advised her to see an eye doctor and dentist annually We will check CBC, c-Met, lipid profile  today  RTC in 6 months, follow-up chronic conditions Webb Silversmith, NP This visit occurred during the SARS-CoV-2 public health emergency.  Safety protocols were in place, including screening questions prior to the visit, additional usage of staff PPE, and extensive cleaning of exam room while observing appropriate contact time as indicated for disinfecting solutions.

## 2021-12-05 NOTE — Patient Instructions (Signed)
Health Maintenance for Postmenopausal Women ?Menopause is a normal process in which your ability to get pregnant comes to an end. This process happens slowly over many months or years, usually between the ages of 48 and 55. Menopause is complete when you have missed your menstrual period for 12 months. ?It is important to talk with your health care provider about some of the most common conditions that affect women after menopause (postmenopausal women). These include heart disease, cancer, and bone loss (osteoporosis). Adopting a healthy lifestyle and getting preventive care can help to promote your health and wellness. The actions you take can also lower your chances of developing some of these common conditions. ?What are the signs and symptoms of menopause? ?During menopause, you may have the following symptoms: ?Hot flashes. These can be moderate or severe. ?Night sweats. ?Decrease in sex drive. ?Mood swings. ?Headaches. ?Tiredness (fatigue). ?Irritability. ?Memory problems. ?Problems falling asleep or staying asleep. ?Talk with your health care provider about treatment options for your symptoms. ?Do I need hormone replacement therapy? ?Hormone replacement therapy is effective in treating symptoms that are caused by menopause, such as hot flashes and night sweats. ?Hormone replacement carries certain risks, especially as you become older. If you are thinking about using estrogen or estrogen with progestin, discuss the benefits and risks with your health care provider. ?How can I reduce my risk for heart disease and stroke? ?The risk of heart disease, heart attack, and stroke increases as you age. One of the causes may be a change in the body's hormones during menopause. This can affect how your body uses dietary fats, triglycerides, and cholesterol. Heart attack and stroke are medical emergencies. There are many things that you can do to help prevent heart disease and stroke. ?Watch your blood pressure ?High  blood pressure causes heart disease and increases the risk of stroke. This is more likely to develop in people who have high blood pressure readings or are overweight. ?Have your blood pressure checked: ?Every 3-5 years if you are 18-39 years of age. ?Every year if you are 40 years old or older. ?Eat a healthy diet ? ?Eat a diet that includes plenty of vegetables, fruits, low-fat dairy products, and lean protein. ?Do not eat a lot of foods that are high in solid fats, added sugars, or sodium. ?Get regular exercise ?Get regular exercise. This is one of the most important things you can do for your health. Most adults should: ?Try to exercise for at least 150 minutes each week. The exercise should increase your heart rate and make you sweat (moderate-intensity exercise). ?Try to do strengthening exercises at least twice each week. Do these in addition to the moderate-intensity exercise. ?Spend less time sitting. Even light physical activity can be beneficial. ?Other tips ?Work with your health care provider to achieve or maintain a healthy weight. ?Do not use any products that contain nicotine or tobacco. These products include cigarettes, chewing tobacco, and vaping devices, such as e-cigarettes. If you need help quitting, ask your health care provider. ?Know your numbers. Ask your health care provider to check your cholesterol and your blood sugar (glucose). Continue to have your blood tested as directed by your health care provider. ?Do I need screening for cancer? ?Depending on your health history and family history, you may need to have cancer screenings at different stages of your life. This may include screening for: ?Breast cancer. ?Cervical cancer. ?Lung cancer. ?Colorectal cancer. ?What is my risk for osteoporosis? ?After menopause, you may be   at increased risk for osteoporosis. Osteoporosis is a condition in which bone destruction happens more quickly than new bone creation. To help prevent osteoporosis or  the bone fractures that can happen because of osteoporosis, you may take the following actions: ?If you are 19-50 years old, get at least 1,000 mg of calcium and at least 600 international units (IU) of vitamin D per day. ?If you are older than age 50 but younger than age 70, get at least 1,200 mg of calcium and at least 600 international units (IU) of vitamin D per day. ?If you are older than age 70, get at least 1,200 mg of calcium and at least 800 international units (IU) of vitamin D per day. ?Smoking and drinking excessive alcohol increase the risk of osteoporosis. Eat foods that are rich in calcium and vitamin D, and do weight-bearing exercises several times each week as directed by your health care provider. ?How does menopause affect my mental health? ?Depression may occur at any age, but it is more common as you become older. Common symptoms of depression include: ?Feeling depressed. ?Changes in sleep patterns. ?Changes in appetite or eating patterns. ?Feeling an overall lack of motivation or enjoyment of activities that you previously enjoyed. ?Frequent crying spells. ?Talk with your health care provider if you think that you are experiencing any of these symptoms. ?General instructions ?See your health care provider for regular wellness exams and vaccines. This may include: ?Scheduling regular health, dental, and eye exams. ?Getting and maintaining your vaccines. These include: ?Influenza vaccine. Get this vaccine each year before the flu season begins. ?Pneumonia vaccine. ?Shingles vaccine. ?Tetanus, diphtheria, and pertussis (Tdap) booster vaccine. ?Your health care provider may also recommend other immunizations. ?Tell your health care provider if you have ever been abused or do not feel safe at home. ?Summary ?Menopause is a normal process in which your ability to get pregnant comes to an end. ?This condition causes hot flashes, night sweats, decreased interest in sex, mood swings, headaches, or lack  of sleep. ?Treatment for this condition may include hormone replacement therapy. ?Take actions to keep yourself healthy, including exercising regularly, eating a healthy diet, watching your weight, and checking your blood pressure and blood sugar levels. ?Get screened for cancer and depression. Make sure that you are up to date with all your vaccines. ?This information is not intended to replace advice given to you by your health care provider. Make sure you discuss any questions you have with your health care provider. ?Document Revised: 03/04/2021 Document Reviewed: 03/04/2021 ?Elsevier Patient Education ? 2022 [employer] ? ?

## 2021-12-12 LAB — CYTOLOGY - PAP
Adequacy: ABSENT
Diagnosis: NEGATIVE

## 2021-12-23 DIAGNOSIS — D259 Leiomyoma of uterus, unspecified: Secondary | ICD-10-CM | POA: Diagnosis not present

## 2021-12-23 DIAGNOSIS — K219 Gastro-esophageal reflux disease without esophagitis: Secondary | ICD-10-CM | POA: Diagnosis not present

## 2021-12-23 DIAGNOSIS — M545 Low back pain, unspecified: Secondary | ICD-10-CM | POA: Diagnosis not present

## 2021-12-23 DIAGNOSIS — Z880 Allergy status to penicillin: Secondary | ICD-10-CM | POA: Diagnosis not present

## 2021-12-23 DIAGNOSIS — F418 Other specified anxiety disorders: Secondary | ICD-10-CM | POA: Diagnosis not present

## 2021-12-23 DIAGNOSIS — Z881 Allergy status to other antibiotic agents status: Secondary | ICD-10-CM | POA: Diagnosis not present

## 2021-12-23 DIAGNOSIS — G4733 Obstructive sleep apnea (adult) (pediatric): Secondary | ICD-10-CM | POA: Diagnosis not present

## 2021-12-23 DIAGNOSIS — J45909 Unspecified asthma, uncomplicated: Secondary | ICD-10-CM | POA: Diagnosis not present

## 2021-12-23 DIAGNOSIS — K660 Peritoneal adhesions (postprocedural) (postinfection): Secondary | ICD-10-CM | POA: Diagnosis not present

## 2021-12-23 DIAGNOSIS — R10817 Generalized abdominal tenderness: Secondary | ICD-10-CM | POA: Diagnosis not present

## 2021-12-23 DIAGNOSIS — K460 Unspecified abdominal hernia with obstruction, without gangrene: Secondary | ICD-10-CM | POA: Diagnosis not present

## 2021-12-23 DIAGNOSIS — J479 Bronchiectasis, uncomplicated: Secondary | ICD-10-CM | POA: Diagnosis not present

## 2021-12-23 DIAGNOSIS — K5651 Intestinal adhesions [bands], with partial obstruction: Secondary | ICD-10-CM | POA: Diagnosis not present

## 2021-12-23 DIAGNOSIS — Z9884 Bariatric surgery status: Secondary | ICD-10-CM | POA: Diagnosis not present

## 2021-12-23 DIAGNOSIS — R109 Unspecified abdominal pain: Secondary | ICD-10-CM | POA: Diagnosis not present

## 2021-12-23 DIAGNOSIS — K589 Irritable bowel syndrome without diarrhea: Secondary | ICD-10-CM | POA: Diagnosis not present

## 2021-12-23 DIAGNOSIS — R1084 Generalized abdominal pain: Secondary | ICD-10-CM | POA: Diagnosis not present

## 2021-12-23 DIAGNOSIS — H04129 Dry eye syndrome of unspecified lacrimal gland: Secondary | ICD-10-CM | POA: Diagnosis not present

## 2021-12-23 DIAGNOSIS — F419 Anxiety disorder, unspecified: Secondary | ICD-10-CM | POA: Diagnosis not present

## 2021-12-23 DIAGNOSIS — Z20822 Contact with and (suspected) exposure to covid-19: Secondary | ICD-10-CM | POA: Diagnosis not present

## 2021-12-23 DIAGNOSIS — K56600 Partial intestinal obstruction, unspecified as to cause: Secondary | ICD-10-CM | POA: Diagnosis not present

## 2021-12-23 DIAGNOSIS — R188 Other ascites: Secondary | ICD-10-CM | POA: Diagnosis not present

## 2021-12-23 DIAGNOSIS — Z90722 Acquired absence of ovaries, bilateral: Secondary | ICD-10-CM | POA: Diagnosis not present

## 2021-12-23 DIAGNOSIS — K56609 Unspecified intestinal obstruction, unspecified as to partial versus complete obstruction: Secondary | ICD-10-CM | POA: Diagnosis not present

## 2021-12-25 ENCOUNTER — Ambulatory Visit: Payer: BC Managed Care – PPO | Admitting: Internal Medicine

## 2021-12-25 NOTE — Progress Notes (Deleted)
? ?Subjective:  ? ? Patient ID: Jennifer Hubbard, female    DOB: October 05, 1961, 61 y.o.   MRN: [id number] ? ?HPI ? ?Patient presents to clinic today with complaint of gas and bloating.  She reports this started 1 week ago. ? ?Review of Systems ? ?   ?Past Medical History:  ?Diagnosis Date  ? Allergy   ? Anemia   ? Asthma   ? Bronchiectasis (HCC)   ? Cervical dystonia   ? Depression   ? IBS (irritable bowel syndrome) 09/10/2020  ? Sleep apnea   ? ? ?Current Outpatient Medications  ?Medication Sig Dispense Refill  ? albuterol (VENTOLIN HFA) 108 (90 Base) MCG/ACT inhaler Inhale into the lungs every 6 (six) hours as needed for wheezing or shortness of breath.    ? busPIRone (BUSPAR) 10 MG tablet Take 1 tablet (10 mg total) by mouth 2 (two) times daily. 180 tablet 1  ? Cholecalciferol (VITAMIN D3) 1.25 MG (50000 UT) CAPS Take by mouth.    ? cycloSPORINE (RESTASIS) 0.05 % ophthalmic emulsion Administer 1 drop to both eyes Two (2) times a day.    ? estradiol-norethindrone (ACTIVELLA) 1-0.5 MG tablet TAKE 1 TABLET BY MOUTH ONCE DAILY 90 tablet 1  ? fluticasone (VERAMYST) 27.5 MCG/SPRAY nasal spray Place 2 sprays into the nose daily.    ? loratadine (CLARITIN) 10 MG tablet Take 10 mg by mouth daily.    ? mometasone-formoterol (DULERA) 200-5 MCG/ACT AERO Inhale into the lungs.    ? montelukast (SINGULAIR) 10 MG tablet Take 10 mg by mouth at bedtime.    ? Multiple Vitamin (MULTIVITAMIN) capsule Take 1 capsule by mouth daily.    ? Omega-3 Fatty Acids (FISH OIL) 1000 MG CAPS Take by mouth.    ? omeprazole (PRILOSEC) 40 MG capsule Take 1 capsule (40 mg total) by mouth daily. 90 capsule 1  ? sertraline (ZOLOFT) 100 MG tablet TAKE 1 TABLET BY MOUTH ONCE DAILY 90 tablet 1  ? ?No current facility-administered medications for this visit.  ? ? ?Allergies  ?Allergen Reactions  ? Cephalexin Other (See Comments)  ?  Other reaction(s): Abdominal Pain ?Other reaction(s): Abdominal Pain ?  ? Erythromycin   ?  nausea  ? Latex Other (See Comments)  ?   Unsure if allergic  ? Penicillins   ?  "childhood allergy"  ? Sulfamethoxazole-Trimethoprim Rash  ? ? ?Family History  ?Problem Relation Age of Onset  ? Atrial fibrillation Mother   ? Heart disease Mother   ? Lung cancer Father   ? Stroke Maternal Grandmother   ? Breast cancer Paternal Grandmother   ? Hypertension Paternal Grandmother   ? Breast cancer Maternal Aunt   ? ? ?Social History  ? ?Socioeconomic History  ? Marital status: Married  ?  Spouse name: Not on file  ? Number of children: Not on file  ? Years of education: Not on file  ? Highest education level: Not on file  ?Occupational History  ? Not on file  ?Tobacco Use  ? Smoking status: Never  ? Smokeless tobacco: Never  ?Vaping Use  ? Vaping Use: Never used  ?Substance and Sexual Activity  ? Alcohol use: Never  ? Drug use: Never  ? Sexual activity: Yes  ?  Birth control/protection: None  ?  Comment: Married  ?Other Topics Concern  ? Not on file  ?Social History Narrative  ? Not on file  ? ?Social Determinants of Health  ? ?Financial Resource Strain: Not on file  ?Food Insecurity:  Not on file  ?Transportation Needs: Not on file  ?Physical Activity: Not on file  ?Stress: Not on file  ?Social Connections: Not on file  ?Intimate Partner Violence: Not on file  ? ? ? ?Constitutional: Denies fever, malaise, fatigue, headache or abrupt weight changes.  ?HEENT: Denies eye pain, eye redness, ear pain, ringing in the ears, wax buildup, runny nose, nasal congestion, bloody nose, or sore throat. ?Respiratory: Denies difficulty breathing, shortness of breath, cough or sputum production.   ?Cardiovascular: Denies chest pain, chest tightness, palpitations or swelling in the hands or feet.  ?Gastrointestinal: Patient reports gas and bloating.  Denies abdominal pain, constipation, diarrhea or blood in the stool.  ?GU: Denies urgency, frequency, pain with urination, burning sensation, blood in urine, odor or discharge. ?Musculoskeletal: Denies decrease in range of motion,  difficulty with gait, muscle pain or joint pain and swelling.  ?Skin: Denies redness, rashes, lesions or ulcercations.  ?Neurological: Denies dizziness, difficulty with memory, difficulty with speech or problems with balance and coordination.  ?Psych: Denies anxiety, depression, SI/HI. ? ?No other specific complaints in a complete review of systems (except as listed in HPI above). ? ?Objective:  ? Physical Exam ? ? ?There were no vitals taken for this visit. ?Wt Readings from Last 3 Encounters:  ?12/05/21 119 lb (54 kg)  ?09/03/21 115 lb 12.8 oz (52.5 kg)  ?08/27/21 117 lb 3.2 oz (53.2 kg)  ? ? ?General: Appears their stated age, well developed, well nourished in NAD. ?Skin: Warm, dry and intact. No rashes, lesions or ulcerations noted. ?HEENT: Head: normal shape and size; Eyes: sclera white, no icterus, conjunctiva pink, PERRLA and EOMs intact; Ears: Tm's gray and intact, normal light reflex; Nose: mucosa pink and moist, septum midline; Throat/Mouth: Teeth present, mucosa pink and moist, no exudate, lesions or ulcerations noted.  ?Neck:  Neck supple, trachea midline. No masses, lumps or thyromegaly present.  ?Cardiovascular: Normal rate and rhythm. S1,S2 noted.  No murmur, rubs or gallops noted. No JVD or BLE edema. No carotid bruits noted. ?Pulmonary/Chest: Normal effort and positive vesicular breath sounds. No respiratory distress. No wheezes, rales or ronchi noted.  ?Abdomen: Soft and nontender. Normal bowel sounds. No distention or masses noted. Liver, spleen and kidneys non palpable. ?Musculoskeletal: Normal range of motion. No signs of joint swelling. No difficulty with gait.  ?Neurological: Alert and oriented. Cranial nerves II-XII grossly intact. Coordination normal.  ?Psychiatric: Mood and affect normal. Behavior is normal. Judgment and thought content normal.  ? ?BMET ?   ?Component Value Date/Time  ? NA 140 12/05/2021 1355  ? NA 142 09/12/2020 1549  ? K 3.7 12/05/2021 1355  ? CL 107 12/05/2021 1355  ?  CO2 25 12/05/2021 1355  ? GLUCOSE 88 12/05/2021 1355  ? BUN 11 12/05/2021 1355  ? BUN 8 09/12/2020 1549  ? CREATININE 0.98 12/05/2021 1355  ? CALCIUM 9.3 12/05/2021 1355  ? GFRNONAA 74 11/20/2020 0959  ? GFRAA 86 11/20/2020 0959  ? ? ?Lipid Panel  ?   ?Component Value Date/Time  ? CHOL 181 12/05/2021 1355  ? TRIG 45 12/05/2021 1355  ? HDL 53 12/05/2021 1355  ? CHOLHDL 3.4 12/05/2021 1355  ? LDLCALC 115 (H) 12/05/2021 1355  ? ? ?CBC ?   ?Component Value Date/Time  ? WBC 5.0 12/05/2021 1355  ? RBC 4.18 12/05/2021 1355  ? HGB 12.0 12/05/2021 1355  ? HGB 13.1 09/12/2020 1549  ? HCT 37.4 12/05/2021 1355  ? HCT 39.2 09/12/2020 1549  ? PLT 190 12/05/2021  1355  ? PLT 218 09/12/2020 1549  ? MCV 89.5 12/05/2021 1355  ? MCV 89 09/12/2020 1549  ? MCH 28.7 12/05/2021 1355  ? MCHC 32.1 12/05/2021 1355  ? RDW 13.1 12/05/2021 1355  ? RDW 13.2 09/12/2020 1549  ? LYMPHSABS 1,080 08/08/2021 1327  ? EOSABS 12 (L) 08/08/2021 1327  ? BASOSABS 18 08/08/2021 1327  ? ? ?Hgb A1C ?No results found for: HGBA1C ? ? ? ? ? ?   ?Assessment & Plan:  ? ?Nicki Reaper, NP ?This visit occurred during the SARS-CoV-2 public health emergency.  Safety protocols were in place, including screening questions prior to the visit, additional usage of staff PPE, and extensive cleaning of exam room while observing appropriate contact time as indicated for disinfecting solutions.  ? ?

## 2022-01-13 DIAGNOSIS — Z880 Allergy status to penicillin: Secondary | ICD-10-CM | POA: Diagnosis not present

## 2022-01-13 DIAGNOSIS — G243 Spasmodic torticollis: Secondary | ICD-10-CM | POA: Diagnosis not present

## 2022-01-13 DIAGNOSIS — Z888 Allergy status to other drugs, medicaments and biological substances status: Secondary | ICD-10-CM | POA: Diagnosis not present

## 2022-01-13 DIAGNOSIS — Z881 Allergy status to other antibiotic agents status: Secondary | ICD-10-CM | POA: Diagnosis not present

## 2022-01-20 ENCOUNTER — Ambulatory Visit
Admission: RE | Admit: 2022-01-20 | Discharge: 2022-01-20 | Disposition: A | Discharge status: Home / Self Care | Payer: BC Managed Care – PPO | Source: Ambulatory Visit | Attending: Internal Medicine | Admitting: Internal Medicine

## 2022-01-20 ENCOUNTER — Other Ambulatory Visit: Payer: Self-pay

## 2022-01-20 DIAGNOSIS — Z1231 Encounter for screening mammogram for malignant neoplasm of breast: Secondary | ICD-10-CM | POA: Insufficient documentation

## 2022-01-20 DIAGNOSIS — Z780 Asymptomatic menopausal state: Secondary | ICD-10-CM | POA: Diagnosis not present

## 2022-01-20 DIAGNOSIS — M8589 Other specified disorders of bone density and structure, multiple sites: Secondary | ICD-10-CM | POA: Diagnosis not present

## 2022-01-30 ENCOUNTER — Other Ambulatory Visit: Payer: Self-pay | Admitting: Internal Medicine

## 2022-01-31 NOTE — Telephone Encounter (Signed)
Requested Prescriptions  ?Pending Prescriptions Disp Refills  ?? estradiol-norethindrone (ACTIVELLA) 1-0.5 MG tablet [Pharmacy Med Name: ESTRADIOL-NORETHINDRONE ACET 1-0.5] 84 tablet 1  ?  Sig: TAKE 1 TABLET BY MOUTH ONCE DAILY  ?  ? OB/GYN:  Contraceptives Failed - 01/30/2022  1:46 PM  ?  ?  Failed - Last BP in normal range  ?  BP Readings from Last 1 Encounters:  ?12/05/21 (!) 142/76  ?   ?  ?  Passed - Valid encounter within last 12 months  ?  Recent Outpatient Visits   ?      ? 1 month ago Encounter for general adult medical examination w/o abnormal findings  ? [facility] [place], [place] W, NP  ? 5 months ago Recurrent oral ulcers  ? [facility] [place], [place] W, NP  ? 5 months ago [facility])  ? [facility] [place], Salvadore Oxford, NP  ? 8 months ago Oral thrush  ? [facility] [place], [place] W, NP  ? 10 months ago Bronchiectasis without complication [facility])  ? [facility] [place], Salvadore Oxford, NP  ?  ?  ?Future Appointments   ?        ? In 4 months Baity, Salvadore Oxford, NP [facility]  ?  ? ?  ?  ?  Passed - Patient is not a smoker  ?  ?  ?? sertraline (ZOLOFT) 100 MG tablet [Pharmacy Med Name: SERTRALINE HCL 100 MG TAB] 90 tablet 1  ?  Sig: TAKE 1 TABLET BY MOUTH ONCE DAILY  ?  ? Not Delegated - Psychiatry:  Antidepressants - SSRI - sertraline Failed - 01/30/2022  1:46 PM  ?  ?  Failed - This refill cannot be delegated  ?  ?  Passed - AST in normal range and within 360 days  ?  AST  ?Date Value Ref Range Status  ?12/05/2021 19 10 - 35 U/L Final  ?   ?  ?  Passed - ALT in normal range and within 360 days  ?  ALT  ?Date Value Ref Range Status  ?12/05/2021 16 6 - 29 U/L Final  ?   ?  ?  Passed - Completed PHQ-2 or PHQ-9 in the last 360 days  ?  ?  Passed - Valid encounter within last 6 months  ?  Recent Outpatient Visits   ?      ? 1 month ago Encounter for general adult medical examination w/o abnormal findings  ? [facility] [place], [place] W, NP  ? 5 months ago Recurrent oral ulcers  ? [facility] [place], [place] W, NP  ? 5 months ago [facility])  ? [facility] [place], Salvadore Oxford, NP  ? 8 months ago Oral thrush  ? [facility] [place], [place] W, NP  ? 10 months ago Bronchiectasis without complication [facility])  ? [facility] [place], Salvadore Oxford, NP  ?  ?  ?Future Appointments   ?        ? In 4 months Baity, Salvadore Oxford, NP [facility], [facility]  ?  ? ?  ?  ?  ? ? ?

## 2022-01-31 NOTE — Telephone Encounter (Signed)
Requested medication (s) are due for refill today: yes ? ?Requested medication (s) are on the active medication list: yes ? ?Last refill:  10/01/21 ? ?Future visit scheduled: yes ? ?Notes to clinic:  Unable to refill per protocol, cannot delegate. ? ? ? ?  ?Requested Prescriptions  ?Pending Prescriptions Disp Refills  ? sertraline (ZOLOFT) 100 MG tablet [Pharmacy Med Name: SERTRALINE HCL 100 MG TAB] 90 tablet 1  ?  Sig: TAKE 1 TABLET BY MOUTH ONCE DAILY  ?  ? Not Delegated - Psychiatry:  Antidepressants - SSRI - sertraline Failed - 01/30/2022  1:46 PM  ?  ?  Failed - This refill cannot be delegated  ?  ?  Passed - AST in normal range and within 360 days  ?  AST  ?Date Value Ref Range Status  ?12/05/2021 19 10 - 35 U/L Final  ?  ?  ?  ?  Passed - ALT in normal range and within 360 days  ?  ALT  ?Date Value Ref Range Status  ?12/05/2021 16 6 - 29 U/L Final  ?  ?  ?  ?  Passed - Completed PHQ-2 or PHQ-9 in the last 360 days  ?  ?  Passed - Valid encounter within last 6 months  ?  Recent Outpatient Visits   ? ?      ? 1 month ago Encounter for general adult medical examination w/o abnormal findings  ? [facility] [place], [place], NP  ? 5 months ago Recurrent oral ulcers  ? [facility] [place], [place] W, NP  ? 5 months ago [facility])  ? [facility] [place], Salvadore Oxford, NP  ? 8 months ago Oral thrush  ? [facility] [place], [place] W, NP  ? 10 months ago Bronchiectasis without complication [facility])  ? [facility] [place], Salvadore Oxford, NP  ? ?  ?  ?Future Appointments   ? ?        ? In 4 months Baity, Salvadore Oxford, NP [facility], [facility]  ? ?  ? ?  ?  ?  ?Signed Prescriptions Disp Refills  ? estradiol-norethindrone (ACTIVELLA) 1-0.5 MG tablet 84 tablet 1  ?  Sig: TAKE 1 TABLET BY MOUTH ONCE DAILY  ?  ? OB/GYN:  Contraceptives Failed - 01/30/2022  1:46 PM  ?  ?  Failed - Last BP in normal range  ?  BP Readings from Last 1 Encounters:  ?12/05/21 (!) 142/76   ?  ?  ?  ?  Passed - Valid encounter within last 12 months  ?  Recent Outpatient Visits   ? ?      ? 1 month ago Encounter for general adult medical examination w/o abnormal findings  ? [facility] [place], [place] W, NP  ? 5 months ago Recurrent oral ulcers  ? [facility] [place], [place] W, NP  ? 5 months ago [facility])  ? [facility] [place], Salvadore Oxford, NP  ? 8 months ago Oral thrush  ? [facility] [place], [place] W, NP  ? 10 months ago Bronchiectasis without complication [facility])  ? [facility] [place], Salvadore Oxford, NP  ? ?  ?  ?Future Appointments   ? ?        ? In 4 months Baity, Salvadore Oxford, NP [facility], [facility]  ? ?  ? ?  ?  ?  Passed - Patient is not a smoker  ?  ?  ? ? ?

## 2022-02-11 DIAGNOSIS — J3489 Other specified disorders of nose and nasal sinuses: Secondary | ICD-10-CM | POA: Diagnosis not present

## 2022-02-11 DIAGNOSIS — J301 Allergic rhinitis due to pollen: Secondary | ICD-10-CM | POA: Diagnosis not present

## 2022-02-11 DIAGNOSIS — R682 Dry mouth, unspecified: Secondary | ICD-10-CM | POA: Diagnosis not present

## 2022-02-11 DIAGNOSIS — F419 Anxiety disorder, unspecified: Secondary | ICD-10-CM | POA: Diagnosis not present

## 2022-02-11 DIAGNOSIS — R0683 Snoring: Secondary | ICD-10-CM | POA: Diagnosis not present

## 2022-02-11 DIAGNOSIS — K219 Gastro-esophageal reflux disease without esophagitis: Secondary | ICD-10-CM | POA: Diagnosis not present

## 2022-02-27 DIAGNOSIS — F419 Anxiety disorder, unspecified: Secondary | ICD-10-CM | POA: Diagnosis not present

## 2022-03-20 ENCOUNTER — Encounter: Payer: Self-pay | Admitting: Internal Medicine

## 2022-03-20 ENCOUNTER — Ambulatory Visit (INDEPENDENT_AMBULATORY_CARE_PROVIDER_SITE_OTHER): Payer: BC Managed Care – PPO | Admitting: Internal Medicine

## 2022-03-20 ENCOUNTER — Ambulatory Visit: Payer: Self-pay

## 2022-03-20 VITALS — BP 124/80 | HR 80 | Temp 97.1°F | Wt 127.0 lb

## 2022-03-20 DIAGNOSIS — R0982 Postnasal drip: Secondary | ICD-10-CM | POA: Diagnosis not present

## 2022-03-20 DIAGNOSIS — R609 Edema, unspecified: Secondary | ICD-10-CM

## 2022-03-20 DIAGNOSIS — R051 Acute cough: Secondary | ICD-10-CM | POA: Diagnosis not present

## 2022-03-20 MED ORDER — FEXOFENADINE HCL 180 MG PO TABS: 180.0000 mg | ORAL_TABLET | Freq: Every day | ORAL | 1 refills | Status: AC

## 2022-03-20 MED ORDER — BUSPIRONE HCL 10 MG PO TABS: 10.0000 mg | ORAL_TABLET | Freq: Three times a day (TID) | ORAL | 1 refills | Status: DC

## 2022-03-20 NOTE — Telephone Encounter (Signed)
  Chief Complaint: bilateral lower leg and ankle swelling Symptoms: both ankles and calve swelling Frequency: 3 weeks Pertinent Negatives: Patient denies redness, chest pain, difficulty breathing Disposition: [] ED /[] Urgent Care (no appt availability in office) / [x] Appointment(In office/virtual)/ []  [facility]/ [] Home Care/ [] Refused Recommended Disposition /[] [facility]/ []  Follow-up with PCP Additional Notes: Pt scheduled with PCP at 1020.       Reason for Disposition  [1] MILD swelling of both ankles (i.e., pedal edema) AND [2] new-onset or worsening  Answer Assessment - Initial Assessment Questions 1. ONSET: "When did the swelling start?" (e.g., minutes, hours, days)     3 weeks 2. LOCATION: "What part of the leg is swollen?"  "Are both legs swollen or just one leg?"     Both-ankle, calves swelling is worse 3. SEVERITY: "How bad is the swelling?" (e.g., localized; mild, moderate, severe)  - Localized - small area of swelling localized to one leg  - MILD pedal edema - swelling limited to foot and ankle, pitting edema < 1/4 inch (6 mm) deep, rest and elevation eliminate most or all swelling  - MODERATE edema - swelling of lower leg to knee, pitting edema > 1/4 inch (6 mm) deep, rest and elevation only partially reduce swelling  - SEVERE edema - swelling extends above knee, facial or hand swelling present      mild 4. REDNESS: "Does the swelling look red or infected?"     no 5. PAIN: "Is the swelling painful to touch?" If Yes, ask: "How painful is it?"   (Scale 1-10; mild, moderate or severe)     no 6. FEVER: "Do you have a fever?" If Yes, ask: "What is it, how was it measured, and when did it start?"      no 7. CAUSE: "What do you think is causing the leg swelling?"     unsure 8. MEDICAL HISTORY: "Do you have a history of heart failure, kidney disease, liver failure, or cancer?"     no 9. RECURRENT SYMPTOM: "Have you had leg swelling before?" If Yes,  ask: "When was the last time?" "What happened that time?"     no 10. OTHER SYMPTOMS: "Do you have any other symptoms?" (e.g., chest pain, difficulty breathing)       no 11. PREGNANCY: "Is there any chance you are pregnant?" "When was your last menstrual period?"       N/a  Protocols used: Leg Swelling and Edema-A-AH

## 2022-03-20 NOTE — Patient Instructions (Signed)
Edema ? ?Edema is when you have too much fluid in your body or under your skin. Edema may make your legs, feet, and ankles swell. Swelling often happens in looser tissues, such as around your eyes. This is a common condition. It gets more common as you get older. ?There are many possible causes of edema. These include: ?Eating too much salt (sodium). ?Being on your feet or sitting for a long time. ?Certain medical conditions, such as: ?Pregnancy. ?Heart failure. ?Liver disease. ?Kidney disease. ?Cancer. ?Hot weather may make edema worse. Edema is usually painless. Your skin may look swollen or shiny. ?Follow these instructions at home: ?Medicines ?Take over-the-counter and prescription medicines only as told by your doctor. ?Your doctor may prescribe a medicine to help your body get rid of extra water (diuretic). Take this medicine if you are told to take it. ?Eating and drinking ?Eat a low-salt (low-sodium) diet as told by your doctor. Sometimes, eating less salt may reduce swelling. ?Depending on the cause of your swelling, you may need to limit how much fluid you drink (fluid restriction). ?General instructions ?Raise the injured area above the level of your heart while you are sitting or lying down. ?Do not sit still or stand for a long time. ?Do not wear tight clothes. Do not wear garters on your upper legs. ?Exercise your legs. This can help the swelling go down. ?Wear compression stockings as told by your doctor. It is important that these are the right size. These should be prescribed by your doctor to prevent possible injuries. ?If elastic bandages or wraps are recommended, use them as told by your doctor. ?Contact a doctor if: ?Treatment is not working. ?You have heart, liver, or kidney disease and have symptoms of edema. ?You have sudden and unexplained weight gain. ?Get help right away if: ?You have shortness of breath or chest pain. ?You cannot breathe when you lie down. ?You have pain, redness, or  warmth in the swollen areas. ?You have heart, liver, or kidney disease and get edema all of a sudden. ?You have a fever and your symptoms get worse all of a sudden. ?These symptoms may be an emergency. Get help right away. Call 911. ?Do not wait to see if the symptoms will go away. ?Do not drive yourself to the hospital. ?Summary ?Edema is when you have too much fluid in your body or under your skin. ?Edema may make your legs, feet, and ankles swell. Swelling often happens in looser tissues, such as around your eyes. ?Raise the injured area above the level of your heart while you are sitting or lying down. ?Follow your doctor's instructions about diet and how much fluid you can drink. ?This information is not intended to replace advice given to you by your health care provider. Make sure you discuss any questions you have with your health care provider. ?Document Revised: 06/17/2021 Document Reviewed: 06/17/2021 ?Elsevier Patient Education ? 2023 Elsevier Inc. ? ?

## 2022-03-20 NOTE — Progress Notes (Signed)
Subjective:    Patient ID: Jennifer Hubbard, female    DOB: 12/12/1960, 61 y.o.   MRN: [id number]  HPI  Patient presents to clinic today with complaint of swelling to her lower extremities.  She noticed this 3 weeks ago.  The swelling is causing some tightness but she denies pain, redness to the areas.  She denies increased salt intake, sitting or standing for long periods of time or shortness of breath.  She reports the swelling has gotten better.  She also reports postnasal drip and cough.  This is an ongoing issue.  She reports she coughs up mucus daily.  She is taking loratadine which she has been taking for years.  She denies shortness of breath.  Review of Systems  Past Medical History:  Diagnosis Date   Allergy    Anemia    Asthma    Bronchiectasis (HCC)    Cervical dystonia    Depression    IBS (irritable bowel syndrome) 09/10/2020   Sleep apnea     Current Outpatient Medications  Medication Sig Dispense Refill   albuterol (VENTOLIN HFA) 108 (90 Base) MCG/ACT inhaler Inhale into the lungs every 6 (six) hours as needed for wheezing or shortness of breath.     busPIRone (BUSPAR) 10 MG tablet Take 1 tablet (10 mg total) by mouth 2 (two) times daily. 180 tablet 1   Cholecalciferol (VITAMIN D3) 1.25 MG (50000 UT) CAPS Take by mouth.     cycloSPORINE (RESTASIS) 0.05 % ophthalmic emulsion Administer 1 drop to both eyes Two (2) times a day.     estradiol-norethindrone (ACTIVELLA) 1-0.5 MG tablet TAKE 1 TABLET BY MOUTH ONCE DAILY 84 tablet 1   fluticasone (VERAMYST) 27.5 MCG/SPRAY nasal spray Place 2 sprays into the nose daily.     loratadine (CLARITIN) 10 MG tablet Take 10 mg by mouth daily.     mometasone-formoterol (DULERA) 200-5 MCG/ACT AERO Inhale into the lungs.     montelukast (SINGULAIR) 10 MG tablet Take 10 mg by mouth at bedtime.     Multiple Vitamin (MULTIVITAMIN) capsule Take 1 capsule by mouth daily.     Omega-3 Fatty Acids (FISH OIL) 1000 MG CAPS Take by mouth.      omeprazole (PRILOSEC) 40 MG capsule Take 1 capsule (40 mg total) by mouth daily. 90 capsule 1   sertraline (ZOLOFT) 100 MG tablet TAKE 1 TABLET BY MOUTH ONCE DAILY 90 tablet 1   No current facility-administered medications for this visit.    Allergies  Allergen Reactions   Cephalexin Other (See Comments)    Other reaction(s): Abdominal Pain Other reaction(s): Abdominal Pain    Erythromycin     nausea   Latex Other (See Comments)    Unsure if allergic   Penicillins     "childhood allergy"   Sulfamethoxazole-Trimethoprim Rash    Family History  Problem Relation Age of Onset   Atrial fibrillation Mother    Heart disease Mother    Lung cancer Father    Stroke Maternal Grandmother    Breast cancer Paternal Grandmother    Hypertension Paternal Grandmother    Breast cancer Maternal Aunt     Social History   Socioeconomic History   Marital status: Married    Spouse name: Not on file   Number of children: Not on file   Years of education: Not on file   Highest education level: Not on file  Occupational History   Not on file  Tobacco Use   Smoking status: Never  Smokeless tobacco: Never  Vaping Use   Vaping Use: Never used  Substance and Sexual Activity   Alcohol use: Never   Drug use: Never   Sexual activity: Yes    Birth control/protection: None    Comment: Married  Other Topics Concern   Not on file  Social History Narrative   Not on file   Social Determinants of Health   Financial Resource Strain: Not on file  Food Insecurity: Not on file  Transportation Needs: Not on file  Physical Activity: Not on file  Stress: Not on file  Social Connections: Not on file  Intimate Partner Violence: Not on file     Constitutional: Denies fever, malaise, fatigue, headache or abrupt weight changes.  HEENT: Patient reports postnasal drip.  Denies eye pain, eye redness, ear pain, ringing in the ears, wax buildup, runny nose, nasal congestion, bloody nose, or sore  throat. Respiratory: Patient reports cough.  Denies difficulty breathing, shortness of breath.   Cardiovascular: Patient reports swelling in her lower extremities.  Denies chest pain, chest tightness, palpitations or swelling in the hands.  Musculoskeletal: Denies decrease in range of motion, difficulty with gait, muscle pain or joint pain and swelling.  Skin: Denies redness, rashes, lesions or ulcercations.  Neurological: Denies dizziness, difficulty with memory, difficulty with speech or problems with balance and coordination.   No other specific complaints in a complete review of systems (except as listed in HPI above).     Objective:   Physical Exam   BP 124/80 (BP Location: Left Arm, Patient Position: Sitting, Cuff Size: Normal)   Pulse 80   Temp (!) 97.1 F (36.2 C) (Temporal)   Wt 127 lb (57.6 kg)   SpO2 100%   BMI 20.50 kg/m   Wt Readings from Last 3 Encounters:  12/05/21 119 lb (54 kg)  09/03/21 115 lb 12.8 oz (52.5 kg)  08/27/21 117 lb 3.2 oz (53.2 kg)    General: Appears her stated age, well developed, well nourished in NAD. Skin: Warm, dry and intact.  HEENT: Head: normal shape and size; Eyes: PERRLA and EOMs intact;  Cardiovascular: Normal rate and rhythm. S1,S2 noted.  No murmur, rubs or gallops noted. No JVD or BLE edema.  Pulmonary/Chest: Normal effort and positive vesicular breath sounds. No respiratory distress. No wheezes, rales or ronchi noted.  Musculoskeletal: difficulty with gait.  Neurological: Alert and oriented.    BMET    Component Value Date/Time   NA 140 12/05/2021 1355   NA 142 09/12/2020 1549   K 3.7 12/05/2021 1355   CL 107 12/05/2021 1355   CO2 25 12/05/2021 1355   GLUCOSE 88 12/05/2021 1355   BUN 11 12/05/2021 1355   BUN 8 09/12/2020 1549   CREATININE 0.98 12/05/2021 1355   CALCIUM 9.3 12/05/2021 1355   GFRNONAA 74 11/20/2020 0959   GFRAA 86 11/20/2020 0959    Lipid Panel     Component Value Date/Time   CHOL 181 12/05/2021  1355   TRIG 45 12/05/2021 1355   HDL 53 12/05/2021 1355   CHOLHDL 3.4 12/05/2021 1355   LDLCALC 115 (H) 12/05/2021 1355    CBC    Component Value Date/Time   WBC 5.0 12/05/2021 1355   RBC 4.18 12/05/2021 1355   HGB 12.0 12/05/2021 1355   HGB 13.1 09/12/2020 1549   HCT 37.4 12/05/2021 1355   HCT 39.2 09/12/2020 1549   PLT 190 12/05/2021 1355   PLT 218 09/12/2020 1549   MCV 89.5 12/05/2021 1355  MCV 89 09/12/2020 1549   MCH 28.7 12/05/2021 1355   MCHC 32.1 12/05/2021 1355   RDW 13.1 12/05/2021 1355   RDW 13.2 09/12/2020 1549   LYMPHSABS 1,080 08/08/2021 1327   EOSABS 12 (L) 08/08/2021 1327   BASOSABS 18 08/08/2021 1327    Hgb A1C No results found for: HGBA1C         Assessment & Plan:   Cough secondary to PND:  D/C Loratadine Rx for Fexofenadine 180 mg p.o. daily  Peripheral Edema:  Seems to have resolved Encouraged if this reoccurs to elevate her legs I would like to avoid diuretic unless absolutely needed  RTC in 3 months, follow-up chronic conditions  Nicki Reaperegina Zariah Cavendish, NP

## 2022-04-07 DIAGNOSIS — F419 Anxiety disorder, unspecified: Secondary | ICD-10-CM | POA: Diagnosis not present

## 2022-04-14 DIAGNOSIS — Z882 Allergy status to sulfonamides status: Secondary | ICD-10-CM | POA: Diagnosis not present

## 2022-04-14 DIAGNOSIS — G243 Spasmodic torticollis: Secondary | ICD-10-CM | POA: Diagnosis not present

## 2022-04-14 DIAGNOSIS — Z880 Allergy status to penicillin: Secondary | ICD-10-CM | POA: Diagnosis not present

## 2022-04-14 DIAGNOSIS — F419 Anxiety disorder, unspecified: Secondary | ICD-10-CM | POA: Diagnosis not present

## 2022-04-14 DIAGNOSIS — Z881 Allergy status to other antibiotic agents status: Secondary | ICD-10-CM | POA: Diagnosis not present

## 2022-04-28 DIAGNOSIS — F419 Anxiety disorder, unspecified: Secondary | ICD-10-CM | POA: Diagnosis not present

## 2022-05-05 DIAGNOSIS — F419 Anxiety disorder, unspecified: Secondary | ICD-10-CM | POA: Diagnosis not present

## 2022-05-12 DIAGNOSIS — F419 Anxiety disorder, unspecified: Secondary | ICD-10-CM | POA: Diagnosis not present

## 2022-05-19 DIAGNOSIS — F419 Anxiety disorder, unspecified: Secondary | ICD-10-CM | POA: Diagnosis not present

## 2022-05-20 DIAGNOSIS — G4733 Obstructive sleep apnea (adult) (pediatric): Secondary | ICD-10-CM | POA: Diagnosis not present

## 2022-05-26 DIAGNOSIS — F419 Anxiety disorder, unspecified: Secondary | ICD-10-CM | POA: Diagnosis not present

## 2022-06-03 DIAGNOSIS — F419 Anxiety disorder, unspecified: Secondary | ICD-10-CM | POA: Diagnosis not present

## 2022-06-05 ENCOUNTER — Encounter: Payer: Self-pay | Admitting: Internal Medicine

## 2022-06-05 ENCOUNTER — Ambulatory Visit (INDEPENDENT_AMBULATORY_CARE_PROVIDER_SITE_OTHER): Payer: BC Managed Care – PPO | Admitting: Internal Medicine

## 2022-06-05 DIAGNOSIS — K219 Gastro-esophageal reflux disease without esophagitis: Secondary | ICD-10-CM

## 2022-06-05 DIAGNOSIS — F419 Anxiety disorder, unspecified: Secondary | ICD-10-CM

## 2022-06-05 DIAGNOSIS — J305 Allergic rhinitis due to food: Secondary | ICD-10-CM | POA: Diagnosis not present

## 2022-06-05 DIAGNOSIS — J479 Bronchiectasis, uncomplicated: Secondary | ICD-10-CM

## 2022-06-05 DIAGNOSIS — F32A Depression, unspecified: Secondary | ICD-10-CM | POA: Diagnosis not present

## 2022-06-05 DIAGNOSIS — J301 Allergic rhinitis due to pollen: Secondary | ICD-10-CM | POA: Diagnosis not present

## 2022-06-05 MED ORDER — NYSTATIN 100000 UNIT/ML MT SUSP: 5.0000 mL | Freq: Four times a day (QID) | OROMUCOSAL | 0 refills | Status: DC

## 2022-06-05 NOTE — Assessment & Plan Note (Signed)
Continue Symbicort, Albuterol, Allegra, Flonase and Mucinex

## 2022-06-05 NOTE — Assessment & Plan Note (Signed)
Continue Sertraline and Buspirone Support offered

## 2022-06-05 NOTE — Patient Instructions (Signed)
Oral Thrush, Adult Oral thrush is an infection in your mouth and throat and on your tongue. It causes white patches to form in your mouth and on your tongue. Many cases of thrush are mild. But, sometimes, thrush can be serious. People who have a weak body defense system (immune system) or other diseases can be affected more. What are the causes? This condition is caused by a type of fungus called yeast. The fungus is normally present in small amounts in the mouth and nose. If a person has a long-term illness or a weak body defense system, the fungus can grow and spread quickly. This causes thrush. What increases the risk? You are more likely to develop this condition if: You have a weak body defense system. You are an older adult. You have diabetes, cancer, or HIV. You have a dry mouth. You are pregnant or breastfeeding. You do not take good care of your teeth. This risk is greater for people who have false teeth (dentures). You use antibiotic or steroid medicines. What are the signs or symptoms? Symptoms of this condition include: A burning feeling in the mouth and throat. White patches that stick to the mouth and tongue. A bad taste in the mouth or trouble tasting foods. A feeling like you have cotton in your mouth. Pain when you eat and swallow. Not wanting to eat as much as usual. Cracking at the corners of the mouth. How is this treated? This condition is treated with medicines called antifungals. These medicines prevent a fungus from growing. The medicines are either put right on the area (topical) or swallowed (oral). Your doctor will also treat other problems that you may have, such as diabetes or HIV. Follow these instructions at home: Helping with pain and soreness To lessen your pain: Drink cold liquids, like water and iced tea. Eat frozen ice pops or frozen juices. Eat foods that are easy to swallow, like gelatin and ice cream. Drink from a straw if you have too much pain  in your mouth.  General instructions Take or use over-the-counter and prescription medicines only as told by your doctor. Eat plain yogurt that has live cultures in it. Read the label to make sure that there are live cultures in your yogurt. If you wear false teeth: Take them out before you go to bed. Brush them well. Soak them in a cleaner. Rinse your mouth with warm salt-water many times a day. To make the salt-water mixture, dissolve -1 teaspoon (3-6 g) of salt in 1 cup (237 mL) of warm water. Contact a doctor if: Your problems do not get better within 7 days of treatment. Your infection is spreading. This may show as white areas on the skin outside of your mouth. You are nursing your baby and you have redness and pain in the nipples. Summary Oral thrush is an infection in your mouth and throat. It is caused by a fungus. You are more likely to get this condition if you have a weak body defense system. Diseases like diabetes, cancer, or HIV also add to your risk. This condition is treated with medicines called antifungals. Contact a doctor if you do not get better within 7 days of starting treatment. This information is not intended to replace advice given to you by your health care provider. Make sure you discuss any questions you have with your health care provider. Document Revised: 06/12/2021 Document Reviewed: 08/19/2019 Elsevier Patient Education  2023 Elsevier Inc.  

## 2022-06-05 NOTE — Progress Notes (Signed)
Subjective:    Patient ID: Jennifer Hubbard, female    DOB: [birth year], 61 y.o.   MRN: [id number]  HPI  Patient presents to clinic today for follow-up of chronic conditions.  Anxiety and Depression: Chronic, managed with Sertraline and Buspirone.  She is currently seeing a therapist but not psychiatry.  She denies SI/HI.  Bronchiectasis: She reports chronic cough but denies shortness of breath. Managed with Singulair, Symbicort, Albuterol, Mucinex, Allegra and Flonase.  She follows with pulmonology.  There are no PFTs on file.  GERD: She takes Omeprazole as needed.  Upper GI from 05/2021 reviewed.  Review of Systems     Past Medical History:  Diagnosis Date   Allergy    Anemia    Asthma    Bronchiectasis (HCC)    Cervical dystonia    Depression    IBS (irritable bowel syndrome) 09/10/2020   Sleep apnea     Current Outpatient Medications  Medication Sig Dispense Refill   albuterol (VENTOLIN HFA) 108 (90 Base) MCG/ACT inhaler Inhale into the lungs every 6 (six) hours as needed for wheezing or shortness of breath.     busPIRone (BUSPAR) 10 MG tablet Take 1 tablet (10 mg total) by mouth 3 (three) times daily. 270 tablet 1   Cholecalciferol (VITAMIN D3) 1.25 MG (50000 UT) CAPS Take by mouth.     cycloSPORINE (RESTASIS) 0.05 % ophthalmic emulsion Administer 1 drop to both eyes Two (2) times a day.     estradiol-norethindrone (ACTIVELLA) 1-0.5 MG tablet TAKE 1 TABLET BY MOUTH ONCE DAILY 84 tablet 1   fexofenadine (ALLEGRA ALLERGY) 180 MG tablet Take 1 tablet (180 mg total) by mouth daily. 90 tablet 1   fluticasone (VERAMYST) 27.5 MCG/SPRAY nasal spray Place 2 sprays into the nose daily.     montelukast (SINGULAIR) 10 MG tablet Take 10 mg by mouth at bedtime.     Multiple Vitamin (MULTIVITAMIN) capsule Take 1 capsule by mouth daily.     Omega-3 Fatty Acids (FISH OIL) 1000 MG CAPS Take by mouth.     omeprazole (PRILOSEC) 40 MG capsule Take 1 capsule (40 mg total) by mouth daily. 90  capsule 1   sertraline (ZOLOFT) 100 MG tablet TAKE 1 TABLET BY MOUTH ONCE DAILY 90 tablet 1   SYMBICORT 160-4.5 MCG/ACT inhaler Inhale 2 puffs into the lungs 2 (two) times daily.     No current facility-administered medications for this visit.    Allergies  Allergen Reactions   Cephalexin Other (See Comments)    Other reaction(s): Abdominal Pain Other reaction(s): Abdominal Pain    Erythromycin     nausea   Latex Other (See Comments)    Unsure if allergic   Penicillins     "childhood allergy"   Sulfamethoxazole-Trimethoprim Rash    Family History  Problem Relation Age of Onset   Atrial fibrillation Mother    Heart disease Mother    Lung cancer Father    Stroke Maternal Grandmother    Breast cancer Paternal Grandmother    Hypertension Paternal Grandmother    Breast cancer Maternal Aunt     Social History   Socioeconomic History   Marital status: Married    Spouse name: Not on file   Number of children: Not on file   Years of education: Not on file   Highest education level: Not on file  Occupational History   Not on file  Tobacco Use   Smoking status: Never   Smokeless tobacco: Never  Vaping Use  Vaping Use: Never used  Substance and Sexual Activity   Alcohol use: Never   Drug use: Never   Sexual activity: Yes    Birth control/protection: None    Comment: Married  Other Topics Concern   Not on file  Social History Narrative   Not on file   Social Determinants of Health   Financial Resource Strain: Not on file  Food Insecurity: Not on file  Transportation Needs: Not on file  Physical Activity: Not on file  Stress: Not on file  Social Connections: Not on file  Intimate Partner Violence: Not on file     Constitutional: Denies fever, malaise, fatigue, headache or abrupt weight changes.  HEENT: Pt reports coating of tongue. Denies eye pain, eye redness, ear pain, ringing in the ears, wax buildup, runny nose, nasal congestion, bloody nose, or sore  throat. Respiratory: Patient reports chronic cough.  Denies difficulty breathing, shortness of breath, or sputum production.   Cardiovascular: Denies chest pain, chest tightness, palpitations or swelling in the hands or feet.  Gastrointestinal: Denies abdominal pain, bloating, constipation, diarrhea or blood in the stool.  GU: Denies urgency, frequency, pain with urination, burning sensation, blood in urine, odor or discharge. Musculoskeletal: Denies decrease in range of motion, difficulty with gait, muscle pain or joint pain and swelling.  Skin: Denies redness, rashes, lesions or ulcercations.  Neurological: Denies dizziness, difficulty with memory, difficulty with speech or problems with balance and coordination.  Psych: Patient has a history of anxiety and depression.  Denies SI/HI.  No other specific complaints in a complete review of systems (except as listed in HPI above).  Objective:   Physical Exam  BP 134/82 (BP Location: Left Arm, Patient Position: Sitting, Cuff Size: Normal)   Pulse 75   Temp 97.7 F (36.5 C) (Temporal)   Wt 135 lb (61.2 kg)   SpO2 100%   BMI 21.79 kg/m   Wt Readings from Last 3 Encounters:  03/20/22 127 lb (57.6 kg)  12/05/21 119 lb (54 kg)  09/03/21 115 lb 12.8 oz (52.5 kg)    General: Appears her stated age, well developed, well nourished in NAD. Skin: Warm, dry and intact.  HEENT: Head: normal shape and size; Eyes: sclera white, no icterus, conjunctiva pink, PERRLA and EOMs intact;  Throat/Mouth: Teeth present, mucosa pink and moist, white coating noted on tongue, no exudate, lesions or ulcerations noted.  Cardiovascular: Normal rate and rhythm. S1,S2 noted.  No murmur, rubs or gallops noted. No JVD or BLE edema Pulmonary/Chest: Normal effort and positive vesicular breath sounds. No respiratory distress. No wheezes, rales or ronchi noted.  Abdomen:  Normal bowel sounds.  Musculoskeletal:  No difficulty with gait.  Neurological: Alert and oriented.   Psychiatric: Mood and affect normal. Behavior is normal. Judgment and thought content normal.    BMET    Component Value Date/Time   NA 140 12/05/2021 1355   NA 142 09/12/2020 1549   K 3.7 12/05/2021 1355   CL 107 12/05/2021 1355   CO2 25 12/05/2021 1355   GLUCOSE 88 12/05/2021 1355   BUN 11 12/05/2021 1355   BUN 8 09/12/2020 1549   CREATININE 0.98 12/05/2021 1355   CALCIUM 9.3 12/05/2021 1355   GFRNONAA 74 11/20/2020 0959   GFRAA 86 11/20/2020 0959    Lipid Panel     Component Value Date/Time   CHOL 181 12/05/2021 1355   TRIG 45 12/05/2021 1355   HDL 53 12/05/2021 1355   CHOLHDL 3.4 12/05/2021 1355   LDLCALC  115 (H) 12/05/2021 1355    CBC    Component Value Date/Time   WBC 5.0 12/05/2021 1355   RBC 4.18 12/05/2021 1355   HGB 12.0 12/05/2021 1355   HGB 13.1 09/12/2020 1549   HCT 37.4 12/05/2021 1355   HCT 39.2 09/12/2020 1549   PLT 190 12/05/2021 1355   PLT 218 09/12/2020 1549   MCV 89.5 12/05/2021 1355   MCV 89 09/12/2020 1549   MCH 28.7 12/05/2021 1355   MCHC 32.1 12/05/2021 1355   RDW 13.1 12/05/2021 1355   RDW 13.2 09/12/2020 1549   LYMPHSABS 1,080 08/08/2021 1327   EOSABS 12 (L) 08/08/2021 1327   BASOSABS 18 08/08/2021 1327    Hgb A1C No results found for: "HGBA1C"         Assessment & Plan:   Oral Thrush:  RX Nystatin 5 ml 4 x day for 5 days  RTC in 6 months for annual exam Nicki Reaper, NP

## 2022-06-05 NOTE — Assessment & Plan Note (Signed)
Try to avoid foods that trigger your reflux Continue Omeprazole 

## 2022-06-09 DIAGNOSIS — F419 Anxiety disorder, unspecified: Secondary | ICD-10-CM | POA: Diagnosis not present

## 2022-06-11 DIAGNOSIS — M7741 Metatarsalgia, right foot: Secondary | ICD-10-CM | POA: Diagnosis not present

## 2022-06-11 DIAGNOSIS — M7742 Metatarsalgia, left foot: Secondary | ICD-10-CM | POA: Diagnosis not present

## 2022-06-17 DIAGNOSIS — F419 Anxiety disorder, unspecified: Secondary | ICD-10-CM | POA: Diagnosis not present

## 2022-06-19 DIAGNOSIS — J301 Allergic rhinitis due to pollen: Secondary | ICD-10-CM | POA: Diagnosis not present

## 2022-06-19 DIAGNOSIS — R682 Dry mouth, unspecified: Secondary | ICD-10-CM | POA: Diagnosis not present

## 2022-06-19 DIAGNOSIS — J4531 Mild persistent asthma with (acute) exacerbation: Secondary | ICD-10-CM | POA: Diagnosis not present

## 2022-06-23 DIAGNOSIS — F419 Anxiety disorder, unspecified: Secondary | ICD-10-CM | POA: Diagnosis not present

## 2022-06-24 DIAGNOSIS — J301 Allergic rhinitis due to pollen: Secondary | ICD-10-CM | POA: Diagnosis not present

## 2022-06-27 DIAGNOSIS — J301 Allergic rhinitis due to pollen: Secondary | ICD-10-CM | POA: Diagnosis not present

## 2022-07-01 DIAGNOSIS — J301 Allergic rhinitis due to pollen: Secondary | ICD-10-CM | POA: Diagnosis not present

## 2022-07-01 DIAGNOSIS — F419 Anxiety disorder, unspecified: Secondary | ICD-10-CM | POA: Diagnosis not present

## 2022-07-02 DIAGNOSIS — H526 Other disorders of refraction: Secondary | ICD-10-CM | POA: Diagnosis not present

## 2022-07-02 DIAGNOSIS — H04123 Dry eye syndrome of bilateral lacrimal glands: Secondary | ICD-10-CM | POA: Diagnosis not present

## 2022-07-02 DIAGNOSIS — Z961 Presence of intraocular lens: Secondary | ICD-10-CM | POA: Diagnosis not present

## 2022-07-04 DIAGNOSIS — J301 Allergic rhinitis due to pollen: Secondary | ICD-10-CM | POA: Diagnosis not present

## 2022-07-08 DIAGNOSIS — F419 Anxiety disorder, unspecified: Secondary | ICD-10-CM | POA: Diagnosis not present

## 2022-07-08 DIAGNOSIS — J301 Allergic rhinitis due to pollen: Secondary | ICD-10-CM | POA: Diagnosis not present

## 2022-07-11 DIAGNOSIS — J301 Allergic rhinitis due to pollen: Secondary | ICD-10-CM | POA: Diagnosis not present

## 2022-07-15 DIAGNOSIS — F419 Anxiety disorder, unspecified: Secondary | ICD-10-CM | POA: Diagnosis not present

## 2022-07-15 DIAGNOSIS — J301 Allergic rhinitis due to pollen: Secondary | ICD-10-CM | POA: Diagnosis not present

## 2022-07-16 ENCOUNTER — Ambulatory Visit: Payer: Self-pay

## 2022-07-16 NOTE — Telephone Encounter (Signed)
That's fine but if she has already started antibiotic, urinalysis will likely not be accurate.

## 2022-07-16 NOTE — Telephone Encounter (Signed)
  Chief Complaint: UTI Symptoms: urinary pain, pressure, frequency, hematuria Frequency: 3 days Pertinent Negatives: NA Disposition: [] ED /[] Urgent Care (no appt availability in office) / [x] Appointment(In office/virtual)/ []  [facility]/ [] Home Care/ [] Refused Recommended Disposition /[] [facility]/ []  Follow-up with PCP Additional Notes: pt asking to come in today to be seen but no appts available. Pt did telehealth appt with insurance and they sent in [facility] for her. She pu this morning and took 1 dose. Scheduled pt for appt on 07/18/22 at 1520. Pt asking if she can come in today and provide urine sample to have results by appt. Called and spoke with Rachell, McLeod who said to send message back and CMA will review and then call back if able to come in. Notified pt of this and she verbalized understanding.   Summary: Possibly bad UTI, declined next available appt   Pt has what she says is a bad UTI, she declined the next available. Wants to be seen sooner      Reason for Disposition  Urinating more frequently than usual (i.e., frequency)  Answer Assessment - Initial Assessment Questions 1. SYMPTOM: "What's the main symptom you're concerned about?" (e.g., frequency, incontinence)     Frequency, painful urination, pressure  2. ONSET: "When did the  sx  start?"     3 days  3. PAIN: "Is there any pain?" If Yes, ask: "How bad is it?" (Scale: 1-10; mild, moderate, severe)     Mild  4. CAUSE: "What do you think is causing the symptoms?"     UTI 5. OTHER SYMPTOMS: "Do you have any other symptoms?" (e.g., blood in urine, fever, flank pain, pain with urination)     Hematuria  Protocols used: Urinary Symptoms-A-AH

## 2022-07-18 ENCOUNTER — Ambulatory Visit (INDEPENDENT_AMBULATORY_CARE_PROVIDER_SITE_OTHER): Payer: BC Managed Care – PPO | Admitting: Internal Medicine

## 2022-07-18 ENCOUNTER — Encounter: Payer: Self-pay | Admitting: Internal Medicine

## 2022-07-18 VITALS — BP 118/64 | HR 86 | Temp 97.3°F | Wt 136.0 lb

## 2022-07-18 DIAGNOSIS — N3001 Acute cystitis with hematuria: Secondary | ICD-10-CM

## 2022-07-18 DIAGNOSIS — J301 Allergic rhinitis due to pollen: Secondary | ICD-10-CM | POA: Diagnosis not present

## 2022-07-18 LAB — POCT URINALYSIS DIPSTICK
Blood, UA: NEGATIVE
Spec Grav, UA: 1.01 (ref 1.010–1.025)
pH, UA: 5 (ref 5.0–8.0)

## 2022-07-18 NOTE — Progress Notes (Signed)
HPI  Pt presents to the clinic today with c/o urinary frequency, burning with urination, blood in urination and bladder pressure. This started 6 days ago. She denies urgency, flank pain, fever, chills, nausea or low back pain. She did an evist for the same, prescribed Macrobid. She reports symptoms have improved but not resolved. She has also taken AZO with some relief of symptoms.   Review of Systems  Past Medical History:  Diagnosis Date   Allergy    Anemia    Asthma    Bronchiectasis (HCC)    Cervical dystonia    Depression    IBS (irritable bowel syndrome) 09/10/2020   Sleep apnea     Family History  Problem Relation Age of Onset   Atrial fibrillation Mother    Heart disease Mother    Lung cancer Father    Stroke Maternal Grandmother    Breast cancer Paternal Grandmother    Hypertension Paternal Grandmother    Breast cancer Maternal Aunt     Social History   Socioeconomic History   Marital status: Married    Spouse name: Not on file   Number of children: Not on file   Years of education: Not on file   Highest education level: Not on file  Occupational History   Not on file  Tobacco Use   Smoking status: Never   Smokeless tobacco: Never  Vaping Use   Vaping Use: Never used  Substance and Sexual Activity   Alcohol use: Never   Drug use: Never   Sexual activity: Yes    Birth control/protection: None    Comment: Married  Other Topics Concern   Not on file  Social History Narrative   Not on file   Social Determinants of Health   Financial Resource Strain: Not on file  Food Insecurity: Not on file  Transportation Needs: Not on file  Physical Activity: Not on file  Stress: Not on file  Social Connections: Not on file  Intimate Partner Violence: Not on file    Allergies  Allergen Reactions   Cephalexin Other (See Comments)    Other reaction(s): Abdominal Pain Other reaction(s): Abdominal Pain    Erythromycin     nausea   Latex Other (See  Comments)    Unsure if allergic   Penicillins     "childhood allergy"   Sulfamethoxazole-Trimethoprim Rash     Constitutional: Denies fever, malaise, fatigue, headache or abrupt weight changes.   GU: Pt reports urgency, frequency and pain with urination. Denies burning sensation, blood in urine, odor or discharge. Skin: Denies redness, rashes, lesions or ulcercations.   No other specific complaints in a complete review of systems (except as listed in HPI above).    Objective:   Physical Exam  BP 118/64 (BP Location: Left Arm, Patient Position: Sitting, Cuff Size: Normal)   Pulse 86   Temp (!) 97.3 F (36.3 C) (Temporal)   Wt 136 lb (61.7 kg)   SpO2 99%   BMI 21.95 kg/m   Wt Readings from Last 3 Encounters:  06/05/22 135 lb (61.2 kg)  03/20/22 127 lb (57.6 kg)  12/05/21 119 lb (54 kg)    General: Appears her stated age, well developed, well nourished in NAD. Cardiovascular: Normal rate and rhythm. S1,S2 noted.   Pulmonary/Chest: Normal effort and positive vesicular breath sounds. No respiratory distress. No wheezes, rales or ronchi noted.  Abdomen: Soft. Normal bowel sounds. No distention or masses noted.  Tender to palpation over the bladder area. No CVA  tenderness.        Assessment & Plan:   Blood in Urine, Frequency, Dysuria:  Urinalysis: AZO interference Will send urine culture Continue Macrobid 100 mg BID x 5 days OK to take AZO OTC Drink plenty of fluids  RTC as needed or if symptoms persist. Webb Silversmith, NP

## 2022-07-18 NOTE — Patient Instructions (Signed)
Urinary Tract Infection, Adult A urinary tract infection (UTI) is an infection of any part of the urinary tract. The urinary tract includes: The kidneys. The ureters. The bladder. The urethra. These organs make, store, and get rid of pee (urine) in the body. What are the causes? This infection is caused by germs (bacteria) in your genital area. These germs grow and cause swelling (inflammation) of your urinary tract. What increases the risk? The following factors may make you more likely to develop this condition: Using a small, thin tube (catheter) to drain pee. Not being able to control when you pee or poop (incontinence). Being female. If you are female, these things can increase the risk: Using these methods to prevent pregnancy: A medicine that kills sperm (spermicide). A device that blocks sperm (diaphragm). Having low levels of a female hormone (estrogen). Being pregnant. You are more likely to develop this condition if: You have genes that add to your risk. You are sexually active. You take antibiotic medicines. You have trouble peeing because of: A prostate that is bigger than normal, if you are female. A blockage in the part of your body that drains pee from the bladder. A kidney stone. A nerve condition that affects your bladder. Not getting enough to drink. Not peeing often enough. You have other conditions, such as: Diabetes. A weak disease-fighting system (immune system). Sickle cell disease. Gout. Injury of the spine. What are the signs or symptoms? Symptoms of this condition include: Needing to pee right away. Peeing small amounts often. Pain or burning when peeing. Blood in the pee. Pee that smells bad or not like normal. Trouble peeing. Pee that is cloudy. Fluid coming from the vagina, if you are female. Pain in the belly or lower back. Other symptoms include: Vomiting. Not feeling hungry. Feeling mixed up (confused). This may be the first symptom in  older adults. Being tired and grouchy (irritable). A fever. Watery poop (diarrhea). How is this treated? Taking antibiotic medicine. Taking other medicines. Drinking enough water. In some cases, you may need to see a specialist. Follow these instructions at home:  Medicines Take over-the-counter and prescription medicines only as told by your doctor. If you were prescribed an antibiotic medicine, take it as told by your doctor. Do not stop taking it even if you start to feel better. General instructions Make sure you: Pee until your bladder is empty. Do not hold pee for a long time. Empty your bladder after sex. Wipe from front to back after peeing or pooping if you are a female. Use each tissue one time when you wipe. Drink enough fluid to keep your pee pale yellow. Keep all follow-up visits. Contact a doctor if: You do not get better after 1-2 days. Your symptoms go away and then come back. Get help right away if: You have very bad back pain. You have very bad pain in your lower belly. You have a fever. You have chills. You feeling like you will vomit or you vomit. Summary A urinary tract infection (UTI) is an infection of any part of the urinary tract. This condition is caused by germs in your genital area. There are many risk factors for a UTI. Treatment includes antibiotic medicines. Drink enough fluid to keep your pee pale yellow. This information is not intended to replace advice given to you by your health care provider. Make sure you discuss any questions you have with your health care provider. Document Revised: 05/25/2020 Document Reviewed: 05/25/2020 Elsevier Patient Education    2023 Elsevier Inc.  

## 2022-07-18 NOTE — Addendum Note (Signed)
Addended by: Ashley Royalty E on: 07/18/2022 03:44 PM   Modules accepted: Orders

## 2022-07-19 LAB — URINE CULTURE
MICRO NUMBER:: 13956012
SPECIMEN QUALITY:: ADEQUATE

## 2022-07-21 DIAGNOSIS — Z881 Allergy status to other antibiotic agents status: Secondary | ICD-10-CM | POA: Diagnosis not present

## 2022-07-21 DIAGNOSIS — G243 Spasmodic torticollis: Secondary | ICD-10-CM | POA: Diagnosis not present

## 2022-07-21 DIAGNOSIS — F419 Anxiety disorder, unspecified: Secondary | ICD-10-CM | POA: Diagnosis not present

## 2022-07-21 DIAGNOSIS — Z882 Allergy status to sulfonamides status: Secondary | ICD-10-CM | POA: Diagnosis not present

## 2022-07-21 DIAGNOSIS — Z880 Allergy status to penicillin: Secondary | ICD-10-CM | POA: Diagnosis not present

## 2022-07-21 DIAGNOSIS — Z79899 Other long term (current) drug therapy: Secondary | ICD-10-CM | POA: Diagnosis not present

## 2022-07-21 DIAGNOSIS — F32A Depression, unspecified: Secondary | ICD-10-CM | POA: Diagnosis not present

## 2022-07-22 ENCOUNTER — Ambulatory Visit: Payer: Self-pay

## 2022-07-22 ENCOUNTER — Other Ambulatory Visit: Payer: Self-pay | Admitting: Internal Medicine

## 2022-07-22 DIAGNOSIS — J301 Allergic rhinitis due to pollen: Secondary | ICD-10-CM | POA: Diagnosis not present

## 2022-07-22 MED ORDER — NYSTATIN 100000 UNIT/ML MT SUSP: 5.0000 mL | Freq: Four times a day (QID) | OROMUCOSAL | 0 refills | Status: DC

## 2022-07-22 NOTE — Telephone Encounter (Signed)
Her urine culture did not grow bacteria. This could be cystitis which is similar symptoms but not caused by bacteria. Antibiotics won't help. She needs to push water, avoid caffeine and chocolate. Antiinflammatories may also help.

## 2022-07-22 NOTE — Telephone Encounter (Signed)
Medication Refill - Medication: nystatin (MYCOSTATIN) 100000 UNIT/ML suspension  Has the patient contacted their pharmacy? No.  Preferred Pharmacy (with phone number or street name):  [facility], [place]. Phone:  [phone]  Fax:  [phone]     Has the patient been seen for an appointment in the last year OR does the patient have an upcoming appointment? Yes.    Agent: Please be advised that RX refills may take up to 3 business days. We ask that you follow-up with your pharmacy.

## 2022-07-22 NOTE — Telephone Encounter (Signed)
Requested medication (s) are due for refill today: yes  Requested medication (s) are on the active medication list: yes    Last refill: 06/05/22  240 ml  0 refills  Future visit scheduled yes 12/09/22  Notes to clinic:Off protocol, please review. Thank you.  Requested Prescriptions  Pending Prescriptions Disp Refills   nystatin (MYCOSTATIN) 100000 UNIT/ML suspension 240 mL 0    Sig: Take 5 mLs (500,000 Units total) by mouth 4 (four) times daily.     Off-Protocol Failed - 07/22/2022 11:41 AM      Failed - Medication not assigned to a protocol, review manually.      Passed - Valid encounter within last 12 months    Recent Outpatient Visits           4 days ago Acute cystitis with hematuria   [facility] [place], Coralie Keens, NP   1 month ago Bronchiectasis without complication [facility])   [facility], Coralie Keens, NP   4 months ago Peripheral edema   [facility] [place], Coralie Keens, NP   7 months ago Encounter for general adult medical examination w/o abnormal findings   [facility] [place], Coralie Keens, NP   10 months ago Recurrent oral ulcers   [facility] [place], Coralie Keens, NP       Future Appointments             In 4 months Baity, Coralie Keens, NP [facility], [facility]

## 2022-07-22 NOTE — Telephone Encounter (Signed)
Pt states she was treated for a uti on 9-22 but is still experiencing burning, pressure, and frequent urination   Pt requesting another round of antibiotics   Please assist further      Chief Complaint: Seen 07/18/22 , UTI. Finished Macrobid, "but I'm not completely  well." Having frequency, burning, pressure. Feels a vaginal yeast infection is starting as well. Asking for additional antibiotic and Diflucan. Symptoms: Above Frequency: 07/18/22 Pertinent Negatives: Patient denies fever Disposition: [] ED /[] Urgent Care (no appt availability in office) / [] Appointment(In office/virtual)/ []  [facility]/ [] Home Care/ [] Refused Recommended Disposition /[] [facility]/ [x]  Follow-up with PCP Additional Notes: Please advise pt.  Answer Assessment - Initial Assessment Questions 1. MAIN SYMPTOM: "What is the main symptom you are concerned about?" (e.g., painful urination, urine frequency)     Burning, frequency, pressure 2. BETTER-SAME-WORSE: "Are you getting better, staying the same, or getting worse compared to how you felt at your last visit to the doctor (most recent medical visit)?"     Better 3. PAIN: "How bad is the pain?"  (e.g., Scale 1-10; mild, moderate, or severe)   - MILD (1-3): complains slightly about urination hurting   - MODERATE (4-7): interferes with normal activities     - SEVERE (8-10): excruciating, unwilling or unable to urinate because of the pain      Now - 4 4. FEVER: "Do you have a fever?" If Yes, ask: "What is it, how was it measured, and when did it start?"     No 5. OTHER SYMPTOMS: "Do you have any other symptoms?" (e.g., blood in the urine, flank pain, vaginal discharge)     No 6. DIAGNOSIS: "When was the UTI diagnosed?" "By whom?" "Was it a kidney infection, bladder infection or both?"     UTI 7. ANTIBIOTIC: "What antibiotic(s) are you taking?" "How many times per day?"     Macrobid 8. ANTIBIOTIC - START DATE: "When did you start taking the  antibiotic?"     Completed  Protocols used: Urinary Tract Infection on Antibiotic Follow-up Call - [facility]

## 2022-07-23 NOTE — Telephone Encounter (Signed)
Called, spoke with patient. Results given. Patient states that she is drinking plenty of water and doing all the necessary things that she was advised to do. She states that she still is having some irrigation. She would like to know if medication could be sent to pharmacy for a yeast infection. Please advise.

## 2022-07-23 NOTE — Telephone Encounter (Signed)
LMTCB 07/23/2022.  PEC please advise pt when she calls back.   Thanks,   -Mickel Baas

## 2022-07-24 MED ORDER — FLUCONAZOLE 150 MG PO TABS: 150.0000 mg | ORAL_TABLET | Freq: Once | ORAL | 0 refills | Status: AC

## 2022-07-24 NOTE — Addendum Note (Signed)
Addended by: Jearld Fenton on: 07/24/2022 07:44 AM   Modules accepted: Orders

## 2022-07-24 NOTE — Telephone Encounter (Signed)
Diflucan sent to pharmacy.

## 2022-07-25 ENCOUNTER — Ambulatory Visit: Payer: Self-pay

## 2022-07-25 NOTE — Telephone Encounter (Signed)
  Chief Complaint: Vaginal discomfort Symptoms: Itch/ burning Frequency: Past few days Pertinent Negatives: Patient denies  Disposition: [] ED /[] Urgent Care (no appt availability in office) / [] Appointment(In office/virtual)/ []  [facility]/ [] Home Care/ [] Refused Recommended Disposition /[] [facility]/ [x]  Follow-up with PCP Additional Notes: Pt took diflucan today, but is still experiencing vaginal discomfort. PT would like a cream called in for burning and itch that will help until diflucan can work.    PT would like a call back after rx is called in.    Summary: Vaginal discomfort, Rx request   Pt is experiencing vaginal discomfort and is asking for a topical cream to be called in for her.   [place], [place] [address]  Phone: [phone] Fax: [phone]  Hours: Not open 24 hours    [phone]     Reason for Disposition  Prescription request for new medicine (not a refill)  Answer Assessment - Initial Assessment Questions 1. DRUG NAME: "What medicine do you need to have refilled?"     Cream for itch 2. REFILLS REMAINING: "How many refills are remaining?" (Note: The label on the medicine or pill bottle will show how many refills are remaining. If there are no refills remaining, then a renewal may be needed.)      3. EXPIRATION DATE: "What is the expiration date?" (Note: The label states when the prescription will expire, and thus can no longer be refilled.)      4. PRESCRIBING HCP: "Who prescribed it?" Reason: If prescribed by specialist, call should be referred to that group.      5. SYMPTOMS: "Do you have any symptoms?"     Vaginal discomfort 6. PREGNANCY: "Is there any chance that you are pregnant?" "When was your last menstrual period?"  Protocols used: Medication Refill and Renewal Call-A-AH

## 2022-07-28 ENCOUNTER — Ambulatory Visit: Payer: Self-pay | Admitting: *Deleted

## 2022-07-28 MED ORDER — TERCONAZOLE 0.8 % VA CREA: 1.0000 | TOPICAL_CREAM | Freq: Every day | VAGINAL | 0 refills | Status: DC

## 2022-07-28 NOTE — Telephone Encounter (Signed)
She can try Monistat OTC

## 2022-07-28 NOTE — Telephone Encounter (Signed)
Summary: Itching and Burning   The patient called in stating the Diflucan has helped but she still is having symptoms. The patient is still having itching and burning and wants to know if she can get more of the Diflucan. She still is having these symptoms from taking the antibiotic. Please assist patient further.       Chief Complaint: medication request due to continued sx vaginal itching and burning Symptoms: vaginal itching and burning persist after taking 1 dose of diflucan 07/25/22.  Frequency: since 07/25/22 Pertinent Negatives: Patient denies na Disposition: [] ED /[] Urgent Care (no appt availability in office) / [] Appointment(In office/virtual)/ []  [place] [facility]/ [] Home Care/ [] Refused Recommended Disposition /[] [place] [facility]/ [x]  Follow-up with PCP Additional Notes:   Requesting medication refill for 2 doses of diflucan and ointment for sx. Patient reports allergy to monistat in the past causes burning. Please advise. Patient requesting a call back if Rx prescribed and sent to pharmacy.     Reason for Disposition  Prescription request for new medicine (not a refill)  Answer Assessment - Initial Assessment Questions 1. NAME of MEDICINE: "What medicine(s) are you calling about?"     Diflucan and ointment for vaginal discomfort 2. QUESTION: "What is your question?" (e.g., double dose of medicine, side effect)     Can patient receive 2 doses of diflucan due to vaginal sx itching and burning? Can ointment be prescribed ?  3. PRESCRIBER: "Who prescribed the medicine?" Reason: if prescribed by specialist, call should be referred to that group.     PCP 4. SYMPTOMS: "Do you have any symptoms?" If Yes, ask: "What symptoms are you having?"  "How bad are the symptoms (e.g., mild, moderate, severe)     Vaginal discomfort itching and burning 5. PREGNANCY:  "Is there any chance that you are pregnant?" "When was your last menstrual period?"     na  Protocols used: Medication  Question Call-A-AH

## 2022-07-28 NOTE — Telephone Encounter (Signed)
Pt states she can't use monistat it causes her to burn.  She says she mentioned that to the triage nurse.  She would like something topical sent into [facility].    Thanks,   -Mickel Baas

## 2022-07-29 DIAGNOSIS — J301 Allergic rhinitis due to pollen: Secondary | ICD-10-CM | POA: Diagnosis not present

## 2022-07-29 NOTE — Telephone Encounter (Signed)
Terconazole sent to pharmacy

## 2022-07-29 NOTE — Telephone Encounter (Signed)
Terconazole cream was sent to pharmacy

## 2022-07-30 ENCOUNTER — Ambulatory Visit: Payer: BC Managed Care – PPO

## 2022-08-04 ENCOUNTER — Ambulatory Visit: Payer: BC Managed Care – PPO

## 2022-08-05 DIAGNOSIS — F419 Anxiety disorder, unspecified: Secondary | ICD-10-CM | POA: Diagnosis not present

## 2022-08-05 DIAGNOSIS — J301 Allergic rhinitis due to pollen: Secondary | ICD-10-CM | POA: Diagnosis not present

## 2022-08-07 ENCOUNTER — Ambulatory Visit (INDEPENDENT_AMBULATORY_CARE_PROVIDER_SITE_OTHER): Payer: BC Managed Care – PPO

## 2022-08-07 ENCOUNTER — Encounter: Payer: Self-pay | Admitting: Physician Assistant

## 2022-08-07 ENCOUNTER — Ambulatory Visit (INDEPENDENT_AMBULATORY_CARE_PROVIDER_SITE_OTHER): Payer: BC Managed Care – PPO | Admitting: Physician Assistant

## 2022-08-07 ENCOUNTER — Other Ambulatory Visit (HOSPITAL_COMMUNITY)
Admission: RE | Admit: 2022-08-07 | Discharge: 2022-08-07 | Disposition: A | Discharge status: Home / Self Care | Payer: BC Managed Care – PPO | Source: Ambulatory Visit | Attending: Physician Assistant | Admitting: Physician Assistant

## 2022-08-07 VITALS — BP 106/64 | HR 89 | Temp 97.3°F | Wt 134.0 lb

## 2022-08-07 DIAGNOSIS — N898 Other specified noninflammatory disorders of vagina: Secondary | ICD-10-CM | POA: Diagnosis not present

## 2022-08-07 DIAGNOSIS — Z23 Encounter for immunization: Secondary | ICD-10-CM

## 2022-08-07 DIAGNOSIS — R300 Dysuria: Secondary | ICD-10-CM

## 2022-08-07 LAB — POCT URINALYSIS DIPSTICK
Bilirubin, UA: NEGATIVE
Blood, UA: NEGATIVE
Glucose, UA: NEGATIVE
Ketones, UA: NEGATIVE
Leukocytes, UA: NEGATIVE
Nitrite, UA: NEGATIVE
Protein, UA: NEGATIVE
Spec Grav, UA: 1.01 (ref 1.010–1.025)
Urobilinogen, UA: 0.2 E.U./dL
pH, UA: 6 (ref 5.0–8.0)

## 2022-08-07 MED ORDER — CLOTRIMAZOLE 1 % VA CREA: 1.0000 | TOPICAL_CREAM | Freq: Every day | VAGINAL | 0 refills | Status: AC

## 2022-08-07 NOTE — Progress Notes (Signed)
Acute Office Visit   Patient: Jennifer Hubbard   DOB: 1961-07-09   61 y.o. Female  MRN: [id number] Visit Date: 08/07/2022  Today's healthcare provider: Dani Gobble Brittin Janik, PA-C  Introduced myself to the patient as a Journalist, newspaper and provided education on APPs in clinical practice.    Chief Complaint  Patient presents with   Vaginal Itching    She states she is having ongoing vaginal itching and rash, irritation    Subjective    HPI HPI     Vaginal Itching    Additional comments: She states she is having ongoing vaginal itching and rash, irritation       Last edited by Tesha Archambeau, Dani Gobble, PA-C on 08/07/2022  1:34 PM.      Vaginal irritation She reports she was seen for UTI 2-3 weeks ago and was treated with Macrobid and AZO After the abx course she started having vaginal irritation and pressure She was given Diflucan but this did not seem to improve symptoms She reports she cannot take vaginal monastat due to burning  Reports she does not think she has had full resolution of symptoms  She denies concerns for STD infection - declines testing today.     Medications: Outpatient Medications Prior to Visit  Medication Sig   albuterol (VENTOLIN HFA) 108 (90 Base) MCG/ACT inhaler Inhale into the lungs every 6 (six) hours as needed for wheezing or shortness of breath.   busPIRone (BUSPAR) 10 MG tablet Take 1 tablet (10 mg total) by mouth 3 (three) times daily.   Cholecalciferol (VITAMIN D3) 1.25 MG (50000 UT) CAPS Take by mouth.   cycloSPORINE (RESTASIS) 0.05 % ophthalmic emulsion Administer 1 drop to both eyes Two (2) times a day.   estradiol-norethindrone (ACTIVELLA) 1-0.5 MG tablet TAKE 1 TABLET BY MOUTH ONCE DAILY   fexofenadine (ALLEGRA ALLERGY) 180 MG tablet Take 1 tablet (180 mg total) by mouth daily.   fluticasone (VERAMYST) 27.5 MCG/SPRAY nasal spray Place 2 sprays into the nose daily.   montelukast (SINGULAIR) 10 MG tablet Take 10 mg by mouth at bedtime.   Multiple Vitamin  (MULTIVITAMIN) capsule Take 1 capsule by mouth daily.   nystatin (MYCOSTATIN) 100000 UNIT/ML suspension Take 5 mLs (500,000 Units total) by mouth 4 (four) times daily.   Omega-3 Fatty Acids (FISH OIL) 1000 MG CAPS Take by mouth.   sertraline (ZOLOFT) 100 MG tablet TAKE 1 TABLET BY MOUTH ONCE DAILY   SYMBICORT 160-4.5 MCG/ACT inhaler Inhale 2 puffs into the lungs 2 (two) times daily.   [DISCONTINUED] terconazole (TERAZOL 3) 0.8 % vaginal cream Place 1 applicator vaginally at bedtime.   No facility-administered medications prior to visit.    Review of Systems  Constitutional:  Negative for chills, diaphoresis and fever.  Gastrointestinal:  Positive for abdominal pain (bilateral, deep pressure).  Genitourinary:  Positive for dysuria (Burning with urination) and vaginal pain. Negative for flank pain, hematuria, vaginal bleeding and vaginal discharge.       Objective    BP 106/64 (BP Location: Left Arm, Patient Position: Sitting, Cuff Size: Normal)   Pulse 89   Temp (!) 97.3 F (36.3 C) (Temporal)   Wt 134 lb (60.8 kg)   SpO2 100%   BMI 21.63 kg/m    Physical Exam Vitals reviewed. Exam conducted with a chaperone present.  Constitutional:      General: She is awake.     Appearance: Normal appearance. She is well-developed, well-groomed and normal weight.  HENT:  Head: Normocephalic and atraumatic.  Pulmonary:     Effort: Pulmonary effort is normal.  Genitourinary:    Tanner stage (genital): 5.     Labia:        Left: Rash and tenderness present.      Vagina: Vaginal discharge and tenderness present.     Cervix: Discharge and erythema present.  Musculoskeletal:     Cervical back: Normal range of motion.  Neurological:     Mental Status: She is alert.  Psychiatric:        Attention and Perception: Attention and perception normal.        Mood and Affect: Mood and affect normal.        Speech: Speech normal.        Behavior: Behavior normal. Behavior is cooperative.        Results for orders placed or performed in visit on 08/07/22  POCT urinalysis dipstick  Result Value Ref Range   Color, UA     Clarity, UA     Glucose, UA Negative Negative   Bilirubin, UA Negative    Ketones, UA Negative    Spec Grav, UA 1.010 1.010 - 1.025   Blood, UA Negative    pH, UA 6.0 5.0 - 8.0   Protein, UA Negative Negative   Urobilinogen, UA 0.2 0.2 or 1.0 E.U./dL   Nitrite, UA Negative    Leukocytes, UA Negative Negative   Appearance     Odor      Assessment & Plan      No follow-ups on file.       Problem List Items Addressed This Visit   None Visit Diagnoses     Dysuria    -  Primary Acute, resolving UA was clear today but with ongoing concerns will send for urine culture for definitive rule out Results to dictate further management  Follow up as needed    Relevant Orders   POCT urinalysis dipstick (Completed)   Urine Culture   Vaginal irritation     Acute, ongoing Reports this has been ongoing for several weeks after she was given Abx for UTI Pelvic exam performed and cervicovaginal swab specimen obtained- will send for testing for yeast, BV, trich- results to dictate further management  PE was notable for mild irritation along labia majora which may be consistent with yeast infection Will send in topical Clotrimazole vaginal cream to assist with this as patient is adamant she needs something for relief Follow up as needed for progressing or persistent symptoms.     Relevant Medications   clotrimazole (GYNE-LOTRIMIN) 1 % vaginal cream   Other Relevant Orders   Cervicovaginal ancillary only        No follow-ups on file.   I, Rhonda Vangieson E Starr Engel, PA-C, have reviewed all documentation for this visit. The documentation on 08/07/22 for the exam, diagnosis, procedures, and orders are all accurate and complete.   Jacquelin Hawking, MHS, PA-C [facility]

## 2022-08-08 LAB — URINE CULTURE
MICRO NUMBER:: 14042943
Result:: NO GROWTH
SPECIMEN QUALITY:: ADEQUATE

## 2022-08-11 ENCOUNTER — Encounter: Payer: Self-pay | Admitting: Internal Medicine

## 2022-08-11 DIAGNOSIS — Z1211 Encounter for screening for malignant neoplasm of colon: Secondary | ICD-10-CM

## 2022-08-11 LAB — CERVICOVAGINAL ANCILLARY ONLY
Bacterial Vaginitis (gardnerella): NEGATIVE
Candida Glabrata: NEGATIVE
Candida Vaginitis: NEGATIVE
Comment: NEGATIVE
Comment: NEGATIVE
Comment: NEGATIVE
Comment: NEGATIVE
Trichomonas: NEGATIVE

## 2022-08-12 DIAGNOSIS — F419 Anxiety disorder, unspecified: Secondary | ICD-10-CM | POA: Diagnosis not present

## 2022-08-12 DIAGNOSIS — J301 Allergic rhinitis due to pollen: Secondary | ICD-10-CM | POA: Diagnosis not present

## 2022-08-13 DIAGNOSIS — H90A21 Sensorineural hearing loss, unilateral, right ear, with restricted hearing on the contralateral side: Secondary | ICD-10-CM | POA: Diagnosis not present

## 2022-08-19 DIAGNOSIS — J301 Allergic rhinitis due to pollen: Secondary | ICD-10-CM | POA: Diagnosis not present

## 2022-08-19 DIAGNOSIS — F419 Anxiety disorder, unspecified: Secondary | ICD-10-CM | POA: Diagnosis not present

## 2022-08-21 DIAGNOSIS — J479 Bronchiectasis, uncomplicated: Secondary | ICD-10-CM | POA: Diagnosis not present

## 2022-08-21 DIAGNOSIS — G4733 Obstructive sleep apnea (adult) (pediatric): Secondary | ICD-10-CM | POA: Diagnosis not present

## 2022-08-21 DIAGNOSIS — Z01818 Encounter for other preprocedural examination: Secondary | ICD-10-CM | POA: Diagnosis not present

## 2022-08-25 ENCOUNTER — Other Ambulatory Visit: Payer: Self-pay | Admitting: Internal Medicine

## 2022-08-26 DIAGNOSIS — F419 Anxiety disorder, unspecified: Secondary | ICD-10-CM | POA: Diagnosis not present

## 2022-08-26 DIAGNOSIS — J301 Allergic rhinitis due to pollen: Secondary | ICD-10-CM | POA: Diagnosis not present

## 2022-08-26 NOTE — Telephone Encounter (Signed)
Requested Prescriptions  Pending Prescriptions Disp Refills  . sertraline (ZOLOFT) 100 MG tablet [Pharmacy Med Name: SERTRALINE HCL 100 MG TAB] 90 tablet 0    Sig: TAKE 1 TABLET BY MOUTH ONCE DAILY     Psychiatry:  Antidepressants - SSRI - sertraline Passed - 08/25/2022 10:15 AM      Passed - AST in normal range and within 360 days    AST  Date Value Ref Range Status  12/05/2021 19 10 - 35 U/L Final         Passed - ALT in normal range and within 360 days    ALT  Date Value Ref Range Status  12/05/2021 16 6 - 29 U/L Final         Passed - Completed PHQ-2 or PHQ-9 in the last 360 days      Passed - Valid encounter within last 6 months    Recent Outpatient Visits          2 weeks ago [facility] Mecum, [place] E, PA-C   1 month ago Acute cystitis with hematuria   [facility] [place], [place] W, NP   2 months ago Bronchiectasis without complication [facility])   [facility], Coralie Keens, NP   5 months ago Peripheral edema   [facility] [place], Coralie Keens, NP   8 months ago Encounter for general adult medical examination w/o abnormal findings   [facility] [place], Coralie Keens, NP      Future Appointments            In 3 months Baity, Coralie Keens, NP [facility], [place] (ACTIVELLA) 1-0.5 MG tablet [Pharmacy Med Name: ESTRADIOL-NORETHINDRONE ACET 1-0.5] 84 tablet 0    Sig: TAKE 1 TABLET BY MOUTH ONCE DAILY     OB/GYN:  Contraceptives Passed - 08/25/2022 10:15 AM      Passed - Last BP in normal range    BP Readings from Last 1 Encounters:  08/07/22 106/64         Passed - Valid encounter within last 12 months    Recent Outpatient Visits          2 weeks ago [facility] Mecum, [place] E, PA-C   1 month ago Acute cystitis with hematuria   [facility] [place], Coralie Keens, NP   2 months ago Bronchiectasis without  complication [facility])   [facility], Coralie Keens, NP   5 months ago Peripheral edema   [facility] [place], Coralie Keens, NP   8 months ago Encounter for general adult medical examination w/o abnormal findings   [facility] [place], Coralie Keens, NP      Future Appointments            In 3 months Baity, Coralie Keens, NP [facility], [place] - Patient is not a smoker

## 2022-08-29 DIAGNOSIS — J4540 Moderate persistent asthma, uncomplicated: Secondary | ICD-10-CM | POA: Diagnosis not present

## 2022-08-29 DIAGNOSIS — J479 Bronchiectasis, uncomplicated: Secondary | ICD-10-CM | POA: Diagnosis not present

## 2022-09-01 DIAGNOSIS — H90A21 Sensorineural hearing loss, unilateral, right ear, with restricted hearing on the contralateral side: Secondary | ICD-10-CM | POA: Diagnosis not present

## 2022-09-01 DIAGNOSIS — J301 Allergic rhinitis due to pollen: Secondary | ICD-10-CM | POA: Diagnosis not present

## 2022-09-02 DIAGNOSIS — F419 Anxiety disorder, unspecified: Secondary | ICD-10-CM | POA: Diagnosis not present

## 2022-09-02 DIAGNOSIS — J301 Allergic rhinitis due to pollen: Secondary | ICD-10-CM | POA: Diagnosis not present

## 2022-09-09 DIAGNOSIS — F419 Anxiety disorder, unspecified: Secondary | ICD-10-CM | POA: Diagnosis not present

## 2022-09-09 DIAGNOSIS — J301 Allergic rhinitis due to pollen: Secondary | ICD-10-CM | POA: Diagnosis not present

## 2022-09-11 DIAGNOSIS — Z882 Allergy status to sulfonamides status: Secondary | ICD-10-CM | POA: Diagnosis not present

## 2022-09-11 DIAGNOSIS — F419 Anxiety disorder, unspecified: Secondary | ICD-10-CM | POA: Diagnosis not present

## 2022-09-11 DIAGNOSIS — R1012 Left upper quadrant pain: Secondary | ICD-10-CM | POA: Diagnosis not present

## 2022-09-11 DIAGNOSIS — R1084 Generalized abdominal pain: Secondary | ICD-10-CM | POA: Diagnosis not present

## 2022-09-11 DIAGNOSIS — R062 Wheezing: Secondary | ICD-10-CM | POA: Diagnosis not present

## 2022-09-11 DIAGNOSIS — R1013 Epigastric pain: Secondary | ICD-10-CM | POA: Diagnosis not present

## 2022-09-11 DIAGNOSIS — Z881 Allergy status to other antibiotic agents status: Secondary | ICD-10-CM | POA: Diagnosis not present

## 2022-09-11 DIAGNOSIS — Z7951 Long term (current) use of inhaled steroids: Secondary | ICD-10-CM | POA: Diagnosis not present

## 2022-09-11 DIAGNOSIS — Z90722 Acquired absence of ovaries, bilateral: Secondary | ICD-10-CM | POA: Diagnosis not present

## 2022-09-11 DIAGNOSIS — Z79899 Other long term (current) drug therapy: Secondary | ICD-10-CM | POA: Diagnosis not present

## 2022-09-11 DIAGNOSIS — F32A Depression, unspecified: Secondary | ICD-10-CM | POA: Diagnosis not present

## 2022-09-11 DIAGNOSIS — Z9884 Bariatric surgery status: Secondary | ICD-10-CM | POA: Diagnosis not present

## 2022-09-11 DIAGNOSIS — Z880 Allergy status to penicillin: Secondary | ICD-10-CM | POA: Diagnosis not present

## 2022-09-11 DIAGNOSIS — R112 Nausea with vomiting, unspecified: Secondary | ICD-10-CM | POA: Diagnosis not present

## 2022-09-15 DIAGNOSIS — J301 Allergic rhinitis due to pollen: Secondary | ICD-10-CM | POA: Diagnosis not present

## 2022-09-16 DIAGNOSIS — G4733 Obstructive sleep apnea (adult) (pediatric): Secondary | ICD-10-CM | POA: Diagnosis not present

## 2022-09-16 DIAGNOSIS — G252 Other specified forms of tremor: Secondary | ICD-10-CM | POA: Diagnosis not present

## 2022-09-16 DIAGNOSIS — Z9884 Bariatric surgery status: Secondary | ICD-10-CM | POA: Diagnosis not present

## 2022-09-16 DIAGNOSIS — D123 Benign neoplasm of transverse colon: Secondary | ICD-10-CM | POA: Diagnosis not present

## 2022-09-16 DIAGNOSIS — J45909 Unspecified asthma, uncomplicated: Secondary | ICD-10-CM | POA: Diagnosis not present

## 2022-09-16 DIAGNOSIS — Z79899 Other long term (current) drug therapy: Secondary | ICD-10-CM | POA: Diagnosis not present

## 2022-09-16 DIAGNOSIS — Q438 Other specified congenital malformations of intestine: Secondary | ICD-10-CM | POA: Diagnosis not present

## 2022-09-16 DIAGNOSIS — K2970 Gastritis, unspecified, without bleeding: Secondary | ICD-10-CM | POA: Diagnosis not present

## 2022-09-16 DIAGNOSIS — K635 Polyp of colon: Secondary | ICD-10-CM | POA: Diagnosis not present

## 2022-09-16 DIAGNOSIS — F32A Depression, unspecified: Secondary | ICD-10-CM | POA: Diagnosis not present

## 2022-09-16 DIAGNOSIS — Z825 Family history of asthma and other chronic lower respiratory diseases: Secondary | ICD-10-CM | POA: Diagnosis not present

## 2022-09-16 DIAGNOSIS — J479 Bronchiectasis, uncomplicated: Secondary | ICD-10-CM | POA: Diagnosis not present

## 2022-09-16 DIAGNOSIS — R1013 Epigastric pain: Secondary | ICD-10-CM | POA: Diagnosis not present

## 2022-09-16 DIAGNOSIS — Z1211 Encounter for screening for malignant neoplasm of colon: Secondary | ICD-10-CM | POA: Diagnosis not present

## 2022-09-16 DIAGNOSIS — F419 Anxiety disorder, unspecified: Secondary | ICD-10-CM | POA: Diagnosis not present

## 2022-09-16 DIAGNOSIS — Z880 Allergy status to penicillin: Secondary | ICD-10-CM | POA: Diagnosis not present

## 2022-09-16 DIAGNOSIS — Z7951 Long term (current) use of inhaled steroids: Secondary | ICD-10-CM | POA: Diagnosis not present

## 2022-09-23 DIAGNOSIS — J301 Allergic rhinitis due to pollen: Secondary | ICD-10-CM | POA: Diagnosis not present

## 2022-09-23 DIAGNOSIS — F419 Anxiety disorder, unspecified: Secondary | ICD-10-CM | POA: Diagnosis not present

## 2022-09-29 DIAGNOSIS — Z9884 Bariatric surgery status: Secondary | ICD-10-CM | POA: Diagnosis not present

## 2022-09-29 DIAGNOSIS — K5281 Eosinophilic gastritis or gastroenteritis: Secondary | ICD-10-CM | POA: Diagnosis not present

## 2022-09-29 DIAGNOSIS — R1013 Epigastric pain: Secondary | ICD-10-CM | POA: Diagnosis not present

## 2022-09-30 DIAGNOSIS — J301 Allergic rhinitis due to pollen: Secondary | ICD-10-CM | POA: Diagnosis not present

## 2022-09-30 DIAGNOSIS — F419 Anxiety disorder, unspecified: Secondary | ICD-10-CM | POA: Diagnosis not present

## 2022-10-07 DIAGNOSIS — F419 Anxiety disorder, unspecified: Secondary | ICD-10-CM | POA: Diagnosis not present

## 2022-10-07 DIAGNOSIS — J301 Allergic rhinitis due to pollen: Secondary | ICD-10-CM | POA: Diagnosis not present

## 2022-10-13 DIAGNOSIS — G243 Spasmodic torticollis: Secondary | ICD-10-CM | POA: Diagnosis not present

## 2022-10-14 DIAGNOSIS — J301 Allergic rhinitis due to pollen: Secondary | ICD-10-CM | POA: Diagnosis not present

## 2022-10-14 DIAGNOSIS — F419 Anxiety disorder, unspecified: Secondary | ICD-10-CM | POA: Diagnosis not present

## 2022-10-21 DIAGNOSIS — J301 Allergic rhinitis due to pollen: Secondary | ICD-10-CM | POA: Diagnosis not present

## 2022-10-28 DIAGNOSIS — J301 Allergic rhinitis due to pollen: Secondary | ICD-10-CM | POA: Diagnosis not present

## 2022-10-28 DIAGNOSIS — F419 Anxiety disorder, unspecified: Secondary | ICD-10-CM | POA: Diagnosis not present

## 2022-10-29 IMAGING — MG DIGITAL SCREENING BILAT W/ TOMO W/ CAD
8 series · 9 of 24 positions shown · non-contrast
Comparison: Previous exam(s).

CLINICAL DATA: Screening.

EXAM:
DIGITAL SCREENING BILATERAL MAMMOGRAM WITH TOMO AND CAD

[L CC synth-2D]
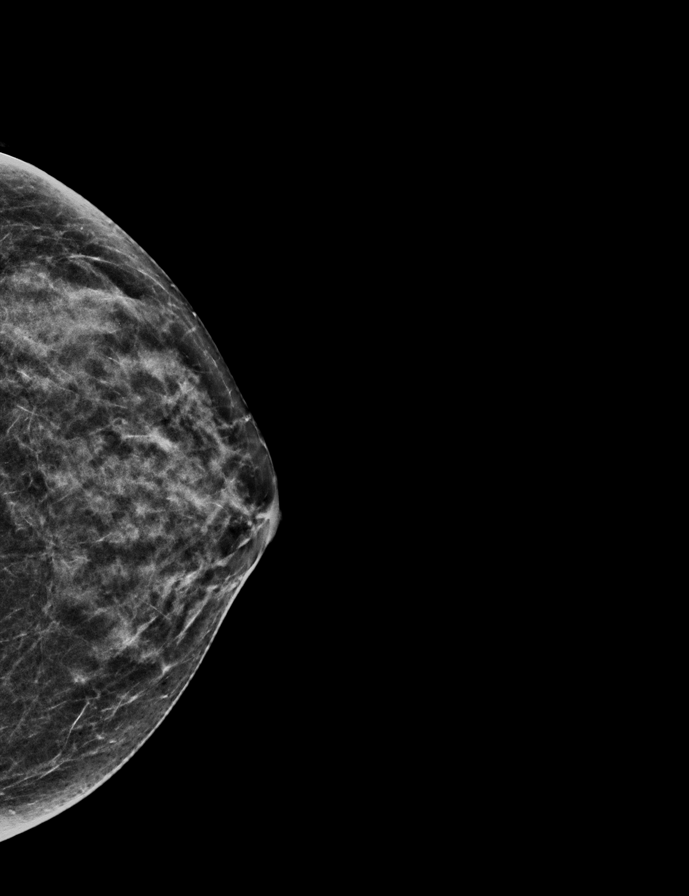

[L MLO synth-2D]
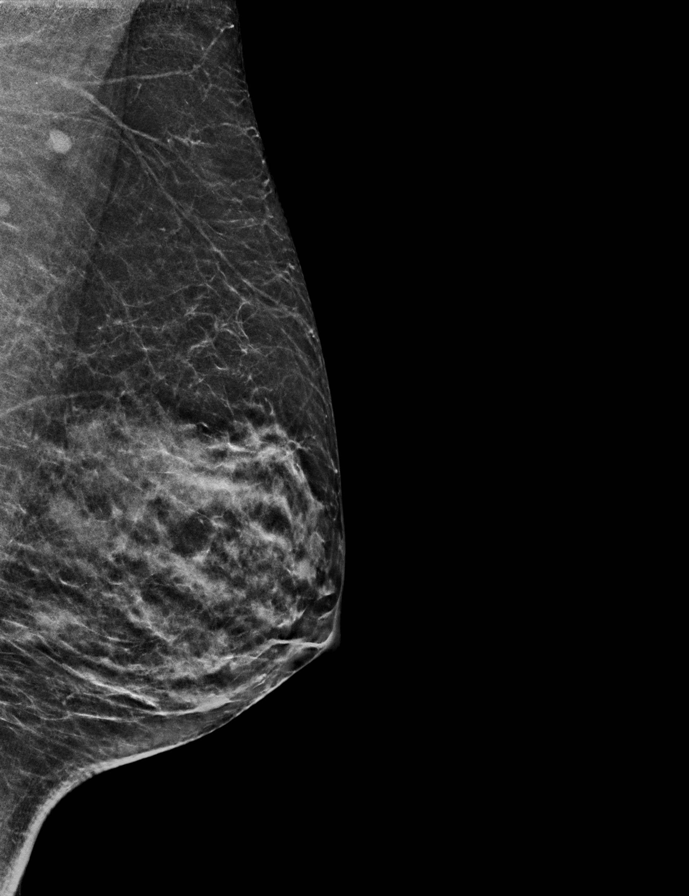

[R MLO synth-2D]
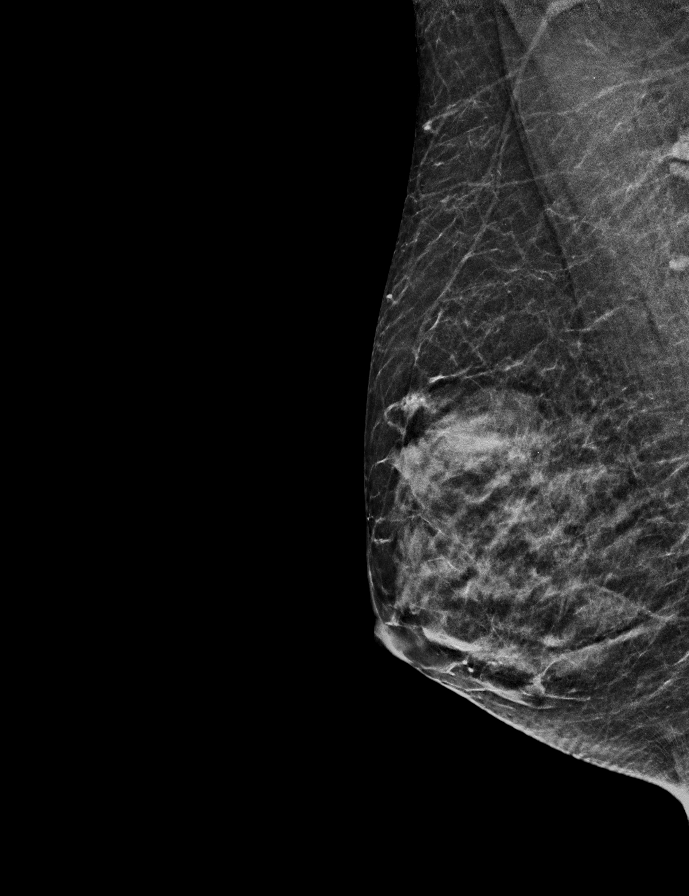

[R CC synth-2D]
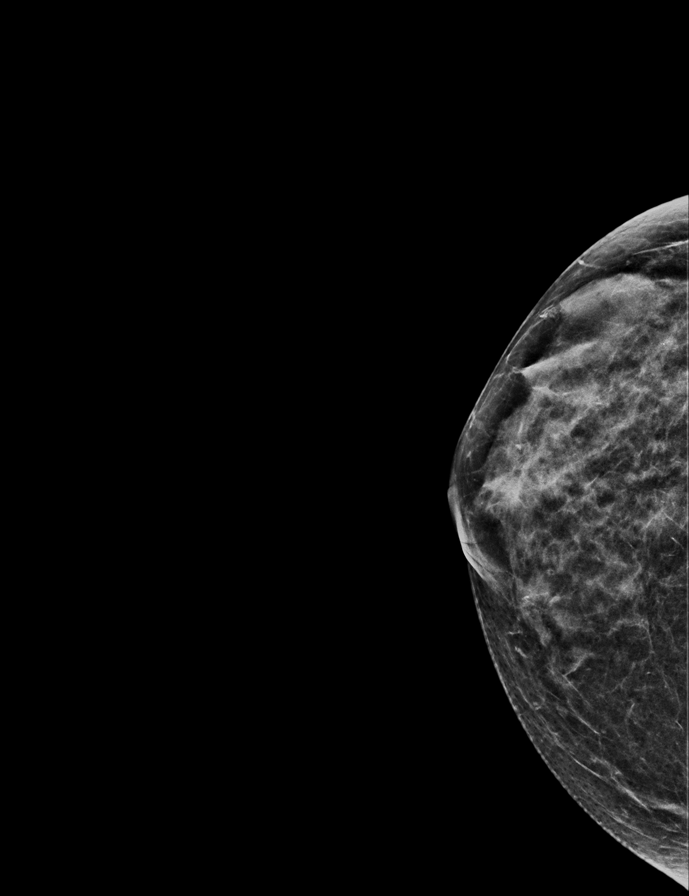

[R MLO tomo · 2 of 44 frames shown]
[frame 15/44]
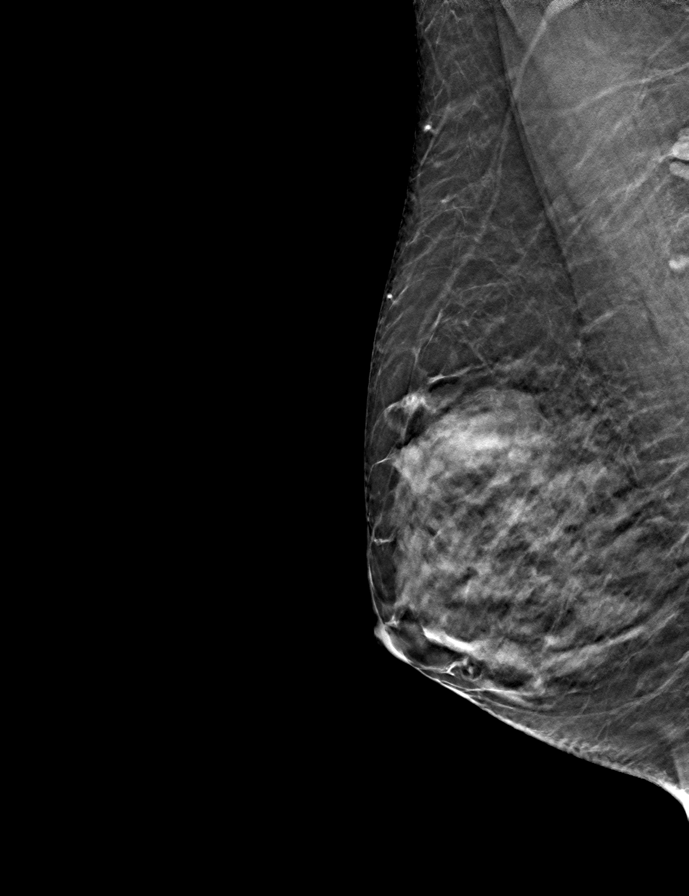
[frame 23/44]
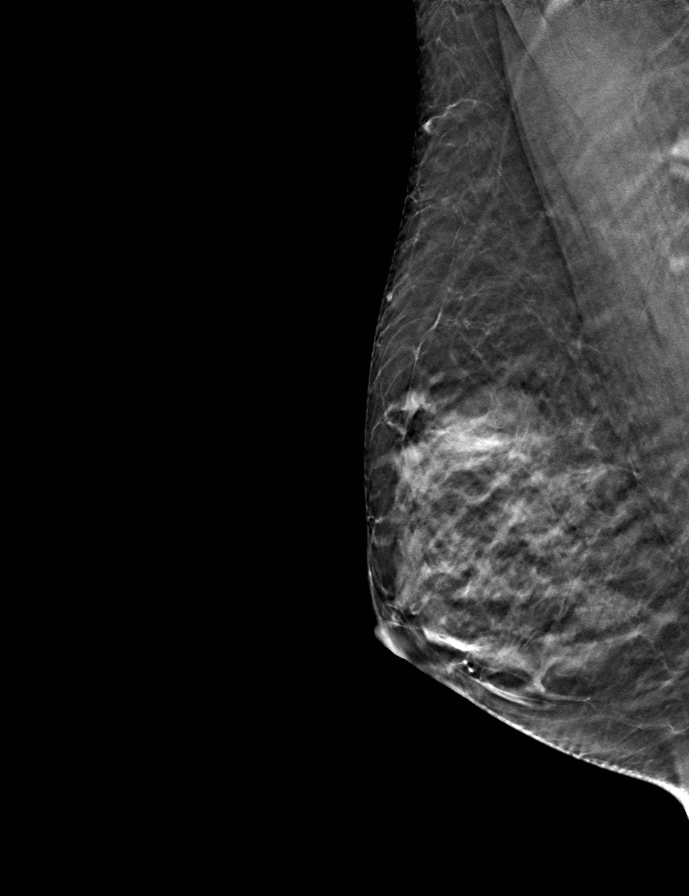

[L MLO tomo · tomo slice 25/48.0]
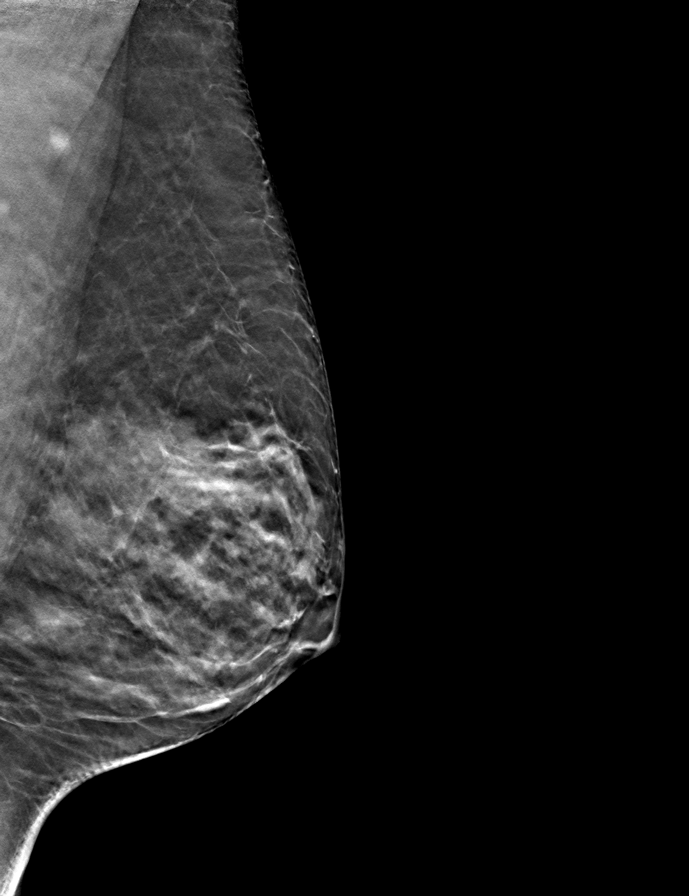

[L CC tomo · tomo slice 24/47.0]
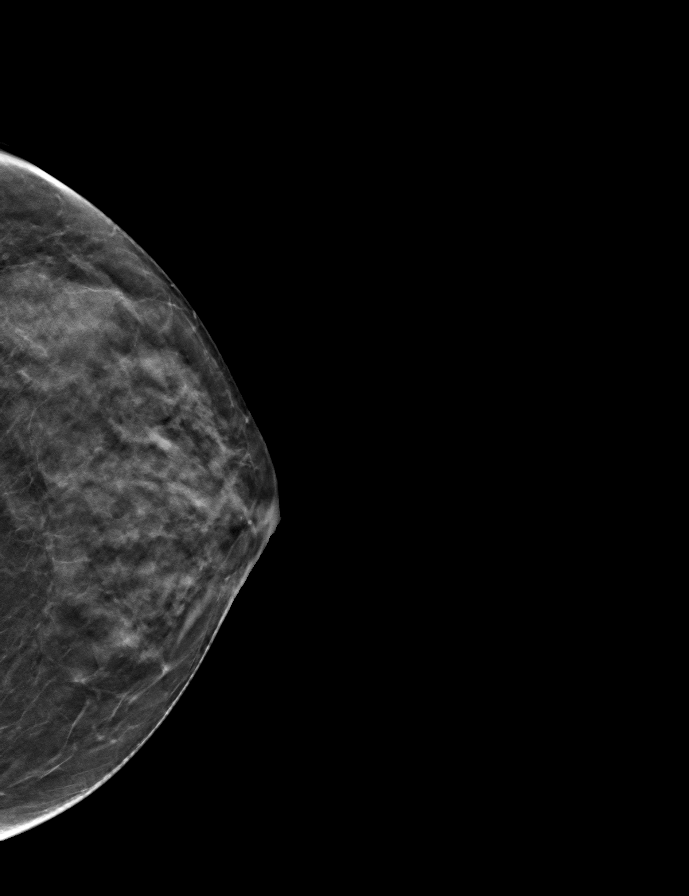

[R CC tomo · tomo slice 24/47.0]
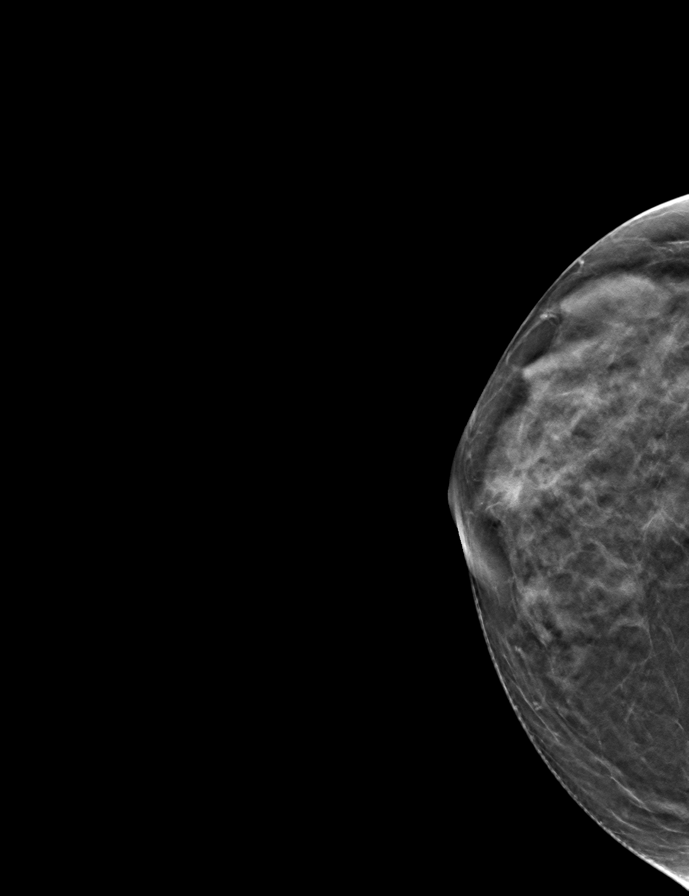

[9 of 24 positions shown; findings below may reference images not displayed]

ACR Breast Density Category c: The breast tissue is heterogeneously
dense, which may obscure small masses.
FINDINGS: There are no findings suspicious for malignancy. Images were
processed with CAD.
IMPRESSION: No mammographic evidence of malignancy. A result letter of this
screening mammogram will be mailed directly to the patient.

RECOMMENDATION:
Screening mammogram in one year. (Code:[id number])

BI-RADS CATEGORY  1: Negative.

## 2022-11-04 DIAGNOSIS — F419 Anxiety disorder, unspecified: Secondary | ICD-10-CM | POA: Diagnosis not present

## 2022-11-04 DIAGNOSIS — J301 Allergic rhinitis due to pollen: Secondary | ICD-10-CM | POA: Diagnosis not present

## 2022-11-11 DIAGNOSIS — J301 Allergic rhinitis due to pollen: Secondary | ICD-10-CM | POA: Diagnosis not present

## 2022-11-11 DIAGNOSIS — F419 Anxiety disorder, unspecified: Secondary | ICD-10-CM | POA: Diagnosis not present

## 2022-11-17 ENCOUNTER — Other Ambulatory Visit: Payer: Self-pay | Admitting: Internal Medicine

## 2022-11-18 NOTE — Telephone Encounter (Signed)
Requested Prescriptions  Pending Prescriptions Disp Refills   estradiol-norethindrone (ACTIVELLA) 1-0.5 MG tablet [Pharmacy Med Name: ESTRADIOL-NORETHINDRONE ACET 1-0.5] 84 tablet 0    Sig: TAKE 1 TABLET BY MOUTH ONCE DAILY     OB/GYN:  Contraceptives Passed - 11/17/2022  8:39 AM      Passed - Last BP in normal range    BP Readings from Last 1 Encounters:  08/07/22 106/64         Passed - Valid encounter within last 12 months    Recent Outpatient Visits           3 months ago [facility] Mecum, [place] E, [place]   4 months ago Acute cystitis with hematuria   [facility] [place], Coralie Keens, NP   5 months ago Bronchiectasis without complication [facility])   [facility] [place], Coralie Keens, NP   8 months ago Peripheral edema   [facility] [place], Coralie Keens, NP   11 months ago Encounter for general adult medical examination w/o abnormal findings   [place], NP       Future Appointments             In 3 weeks Garnette Gunner, Coralie Keens, NP [facility], [place] - Patient is not a smoker

## 2022-11-25 DIAGNOSIS — J301 Allergic rhinitis due to pollen: Secondary | ICD-10-CM | POA: Diagnosis not present

## 2022-12-02 ENCOUNTER — Telehealth: Payer: Self-pay

## 2022-12-02 NOTE — Telephone Encounter (Signed)
I don't know what needs to be refilled.  I left a message for Jull to call back and give [place] the information.   Thanks,   -Mickel Baas

## 2022-12-02 NOTE — Telephone Encounter (Signed)
Copied from [place] [id number]. Topic: General - Other >> Dec 01, 2022  4:02 PM Dominique A wrote: Reason for CRM: Pt has to reschedule her original appt (12/09/2022) due to starting a new job and not being able to take off. Pt new appt is (01/12/2023). Pt is wanting to make sure she will still be able to get her medication before her appointment in March.

## 2022-12-09 ENCOUNTER — Encounter: Payer: BC Managed Care – PPO | Admitting: Internal Medicine

## 2022-12-11 ENCOUNTER — Other Ambulatory Visit: Payer: Self-pay

## 2022-12-11 MED ORDER — BUSPIRONE HCL 10 MG PO TABS: 10.0000 mg | ORAL_TABLET | Freq: Three times a day (TID) | ORAL | 0 refills | Status: DC

## 2022-12-11 MED ORDER — SERTRALINE HCL 100 MG PO TABS: 100.0000 mg | ORAL_TABLET | Freq: Every day | ORAL | 0 refills | Status: DC

## 2022-12-11 MED ORDER — MONTELUKAST SODIUM 10 MG PO TABS: 10.0000 mg | ORAL_TABLET | Freq: Every day | ORAL | 0 refills | Status: DC

## 2022-12-11 MED ORDER — ESTRADIOL-NORETHINDRONE ACET 1-0.5 MG PO TABS: 1.0000 | ORAL_TABLET | Freq: Every day | ORAL | 0 refills | Status: DC

## 2023-01-10 ENCOUNTER — Other Ambulatory Visit: Payer: Self-pay | Admitting: Internal Medicine

## 2023-01-12 NOTE — Telephone Encounter (Signed)
Requested Prescriptions  Pending Prescriptions Disp Refills   busPIRone (BUSPAR) 10 MG tablet [Pharmacy Med Name: BUSPIRONE HCL 10 MG TAB] 270 tablet 1    Sig: TAKE 1 TABLET BY MOUTH 3 TIMES DAILY     Psychiatry: Anxiolytics/Hypnotics - Non-controlled Passed - 01/10/2023  5:33 PM      Passed - Valid encounter within last 12 months    Recent Outpatient Visits           5 months ago [facility] Mecum, [place] E, PA-C   5 months ago Acute cystitis with hematuria   [facility] [place], Coralie Keens, NP   7 months ago Bronchiectasis without complication [facility])   [facility], Coralie Keens, NP   9 months ago Peripheral edema   [facility] [place], Coralie Keens, NP   1 year ago Encounter for general adult medical examination w/o abnormal findings   [facility], Coralie Keens, NP       Future Appointments             In 1 week Garnette Gunner, Coralie Keens, NP [facility]

## 2023-01-16 ENCOUNTER — Ambulatory Visit: Payer: Self-pay

## 2023-01-16 NOTE — Telephone Encounter (Signed)
Pt states that she has a yeast infection and is wanting to know if medication can be called in for her. Pt is allergic to over the counter Monistat. If cream is called in it would need to be for 5 days and not Monistat. Pt also states that 1 Diflucan pill does not work she normally has to take a course of Diflucan medication. Pt states that she is still at work, if she does not answer to please leave her a message.      [place], [place].  Phone: [phone]  Fax: [phone]    Called pt - left message on machine to call back

## 2023-01-16 NOTE — Telephone Encounter (Signed)
Pt states that she has a yeast infection and is wanting to know if medication can be called in for her. Pt is allergic to over the counter Monistat. If cream is called in it would need to be for 5 days and not Monistat. Pt also states that 1 Diflucan pill does not work she normally has to take a course of Diflucan medication. Pt states that she is still at work, if she does not answer to please leave her a message.      [place], [place].  Phone: [phone]  Fax: [phone]      Chief Complaint: vaginal itching requesting medication due to unable to use OTC Symptoms: vaginal itching severe, no discharge. Reports unknown if redness noted. Pain from itching no urinary burning or frequency  Frequency: na  Pertinent Negatives: Patient denies fever no vaginal discharge Disposition: [] ED /[] Urgent Care (no appt availability in office) / [] Appointment(In office/virtual)/ []  [place] [facility]/ [] Home Care/ [x] Refused Recommended Disposition /[] [place] [facility]/ []  Follow-up with PCP Additional Notes:   Recommended OV patient reports she can not get off of work to come in . Next appt already scheduled for 01/22/23. Requesting if medication can be prescribed. See above request. Patient would like a call back to confirm. Please advise .      Reason for Disposition  [1] MILD-MODERATE pain AND [2] present > 24 hours  (Exception: Chronic pain.)  Answer Assessment - Initial Assessment Questions 1. SYMPTOM: "What's the main symptom you're concerned about?" (e.g., pain, itching, dryness) Vaginal Itching  2. LOCATION: "Where is the  pain and itching  located?" (e.g., inside/outside, left/right)     Vaginal area  3. ONSET: "When did the  na  start?"     na 4. PAIN: "Is there any pain?" If Yes, ask: "How bad is it?" (Scale: 1-10; mild, moderate, severe)   -  MILD (1-3): Doesn't interfere with normal activities.    -  MODERATE (4-7): Interferes with normal  activities (e.g., work or school) or awakens from sleep.     -  SEVERE (8-10): Excruciating pain, unable to do any normal activities.     Some pain  5. ITCHING: "Is there any itching?" If Yes, ask: "How bad is it?" (Scale: 1-10; mild, moderate, severe)     severe 6. CAUSE: "What do you think is causing the discharge?" "Have you had the same problem before? What happened then?"     No discharge  7. OTHER SYMPTOMS: "Do you have any other symptoms?" (e.g., fever, itching, vaginal bleeding, pain with urination, injury to genital area, vaginal foreign body)    Denies vaginal discharge only severe itching  8. PREGNANCY: "Is there any chance you are pregnant?" "When was your last menstrual period?"     na  Protocols used: Vaginal Symptoms-A-AH

## 2023-01-19 NOTE — Telephone Encounter (Signed)
Left message advising pt she needs an appointment.   Thanks,   -Mickel Baas

## 2023-01-22 ENCOUNTER — Encounter: Payer: Self-pay | Admitting: Internal Medicine

## 2023-01-22 ENCOUNTER — Ambulatory Visit (INDEPENDENT_AMBULATORY_CARE_PROVIDER_SITE_OTHER): Payer: BC Managed Care – PPO | Admitting: Internal Medicine

## 2023-01-22 ENCOUNTER — Other Ambulatory Visit (HOSPITAL_COMMUNITY)
Admission: RE | Admit: 2023-01-22 | Discharge: 2023-01-22 | Disposition: A | Discharge status: Home / Self Care | Payer: BC Managed Care – PPO | Source: Ambulatory Visit | Attending: Internal Medicine | Admitting: Internal Medicine

## 2023-01-22 VITALS — BP 126/72 | HR 84 | Temp 96.7°F | Ht 66.0 in | Wt 138.0 lb

## 2023-01-22 DIAGNOSIS — Z0001 Encounter for general adult medical examination with abnormal findings: Secondary | ICD-10-CM

## 2023-01-22 DIAGNOSIS — M545 Low back pain, unspecified: Secondary | ICD-10-CM

## 2023-01-22 DIAGNOSIS — N898 Other specified noninflammatory disorders of vagina: Secondary | ICD-10-CM | POA: Diagnosis present

## 2023-01-22 DIAGNOSIS — Z1329 Encounter for screening for other suspected endocrine disorder: Secondary | ICD-10-CM

## 2023-01-22 DIAGNOSIS — G8929 Other chronic pain: Secondary | ICD-10-CM

## 2023-01-22 DIAGNOSIS — E559 Vitamin D deficiency, unspecified: Secondary | ICD-10-CM

## 2023-01-22 DIAGNOSIS — Z1231 Encounter for screening mammogram for malignant neoplasm of breast: Secondary | ICD-10-CM | POA: Diagnosis not present

## 2023-01-22 LAB — POCT URINALYSIS DIPSTICK
Bilirubin, UA: NEGATIVE
Glucose, UA: NEGATIVE
Ketones, UA: NEGATIVE
Nitrite, UA: NEGATIVE
Protein, UA: NEGATIVE
Spec Grav, UA: 1.01 (ref 1.010–1.025)
Urobilinogen, UA: 0.2 E.U./dL
pH, UA: 6 (ref 5.0–8.0)

## 2023-01-22 MED ORDER — MONTELUKAST SODIUM 10 MG PO TABS: 10.0000 mg | ORAL_TABLET | Freq: Every day | ORAL | 1 refills | Status: DC

## 2023-01-22 MED ORDER — SERTRALINE HCL 100 MG PO TABS: 100.0000 mg | ORAL_TABLET | Freq: Every day | ORAL | 1 refills | Status: DC

## 2023-01-22 NOTE — Patient Instructions (Signed)
Health Maintenance for Postmenopausal Women Menopause is a normal process in which your ability to get pregnant comes to an end. This process happens slowly over many months or years, usually between the ages of 48 and 55. Menopause is complete when you have missed your menstrual period for 12 months. It is important to talk with your health care provider about some of the most common conditions that affect women after menopause (postmenopausal women). These include heart disease, cancer, and bone loss (osteoporosis). Adopting a healthy lifestyle and getting preventive care can help to promote your health and wellness. The actions you take can also lower your chances of developing some of these common conditions. What are the signs and symptoms of menopause? During menopause, you may have the following symptoms: Hot flashes. These can be moderate or severe. Night sweats. Decrease in sex drive. Mood swings. Headaches. Tiredness (fatigue). Irritability. Memory problems. Problems falling asleep or staying asleep. Talk with your health care provider about treatment options for your symptoms. Do I need hormone replacement therapy? Hormone replacement therapy is effective in treating symptoms that are caused by menopause, such as hot flashes and night sweats. Hormone replacement carries certain risks, especially as you become older. If you are thinking about using estrogen or estrogen with progestin, discuss the benefits and risks with your health care provider. How can I reduce my risk for heart disease and stroke? The risk of heart disease, heart attack, and stroke increases as you age. One of the causes may be a change in the body's hormones during menopause. This can affect how your body uses dietary fats, triglycerides, and cholesterol. Heart attack and stroke are medical emergencies. There are many things that you can do to help prevent heart disease and stroke. Watch your blood pressure High  blood pressure causes heart disease and increases the risk of stroke. This is more likely to develop in people who have high blood pressure readings or are overweight. Have your blood pressure checked: Every 3-5 years if you are 18-39 years of age. Every year if you are 40 years old or older. Eat a healthy diet  Eat a diet that includes plenty of vegetables, fruits, low-fat dairy products, and lean protein. Do not eat a lot of foods that are high in solid fats, added sugars, or sodium. Get regular exercise Get regular exercise. This is one of the most important things you can do for your health. Most adults should: Try to exercise for at least 150 minutes each week. The exercise should increase your heart rate and make you sweat (moderate-intensity exercise). Try to do strengthening exercises at least twice each week. Do these in addition to the moderate-intensity exercise. Spend less time sitting. Even light physical activity can be beneficial. Other tips Work with your health care provider to achieve or maintain a healthy weight. Do not use any products that contain nicotine or tobacco. These products include cigarettes, chewing tobacco, and vaping devices, such as e-cigarettes. If you need help quitting, ask your health care provider. Know your numbers. Ask your health care provider to check your cholesterol and your blood sugar (glucose). Continue to have your blood tested as directed by your health care provider. Do I need screening for cancer? Depending on your health history and family history, you may need to have cancer screenings at different stages of your life. This may include screening for: Breast cancer. Cervical cancer. Lung cancer. Colorectal cancer. What is my risk for osteoporosis? After menopause, you may be   at increased risk for osteoporosis. Osteoporosis is a condition in which bone destruction happens more quickly than new bone creation. To help prevent osteoporosis or  the bone fractures that can happen because of osteoporosis, you may take the following actions: If you are 19-50 years old, get at least 1,000 mg of calcium and at least 600 international units (IU) of vitamin D per day. If you are older than age 50 but younger than age 70, get at least 1,200 mg of calcium and at least 600 international units (IU) of vitamin D per day. If you are older than age 70, get at least 1,200 mg of calcium and at least 800 international units (IU) of vitamin D per day. Smoking and drinking excessive alcohol increase the risk of osteoporosis. Eat foods that are rich in calcium and vitamin D, and do weight-bearing exercises several times each week as directed by your health care provider. How does menopause affect my mental health? Depression may occur at any age, but it is more common as you become older. Common symptoms of depression include: Feeling depressed. Changes in sleep patterns. Changes in appetite or eating patterns. Feeling an overall lack of motivation or enjoyment of activities that you previously enjoyed. Frequent crying spells. Talk with your health care provider if you think that you are experiencing any of these symptoms. General instructions See your health care provider for regular wellness exams and vaccines. This may include: Scheduling regular health, dental, and eye exams. Getting and maintaining your vaccines. These include: Influenza vaccine. Get this vaccine each year before the flu season begins. Pneumonia vaccine. Shingles vaccine. Tetanus, diphtheria, and pertussis (Tdap) booster vaccine. Your health care provider may also recommend other immunizations. Tell your health care provider if you have ever been abused or do not feel safe at home. Summary Menopause is a normal process in which your ability to get pregnant comes to an end. This condition causes hot flashes, night sweats, decreased interest in sex, mood swings, headaches, or lack  of sleep. Treatment for this condition may include hormone replacement therapy. Take actions to keep yourself healthy, including exercising regularly, eating a healthy diet, watching your weight, and checking your blood pressure and blood sugar levels. Get screened for cancer and depression. Make sure that you are up to date with all your vaccines. This information is not intended to replace advice given to you by your health care provider. Make sure you discuss any questions you have with your health care provider. Document Revised: 03/04/2021 Document Reviewed: 03/04/2021 Elsevier Patient Education  2023 Elsevier Inc.  

## 2023-01-22 NOTE — Progress Notes (Signed)
Subjective:    Patient ID: Jennifer Hubbard, female    DOB: 12-30-1960, 62 y.o.   MRN: [id number]  HPI  Patient presents to clinic today for her annual exam.  She does report some vaginal irritation.  She has tried nystatin cream and Diflucan with some improvement in symptoms.  She also reports increasing back pain.  She reports that she sits for work all day and feels like this is the reason for the increased back pain.  She tries to get up and stretch is much as possible.  Flu: 07/2022 Tetanus: 09/2013 COVID: Pfizer x 2 Zostavax: 12/2015 Shingrix: 04/2017 Pneumovax: 10/2012 Prevnar 20: 09/2021 Pap smear: 11/2021 Mammogram: 12/2021 Bone density: 12/2021 Colon screening: 11/2020, Cologuard Vision screening: annually Dentist: biannually  Diet: She does eat meat. She consumes fruits and veggies. She tries to avoid fried foods. She drinks mostly water with lemon, hot tea. Exercise: Walking  Review of Systems     Past Medical History:  Diagnosis Date   Allergy    Anemia    Asthma    Bronchiectasis (HCC)    Cervical dystonia    Depression    IBS (irritable bowel syndrome) 09/10/2020   Sleep apnea     Current Outpatient Medications  Medication Sig Dispense Refill   albuterol (VENTOLIN HFA) 108 (90 Base) MCG/ACT inhaler Inhale into the lungs every 6 (six) hours as needed for wheezing or shortness of breath.     busPIRone (BUSPAR) 10 MG tablet TAKE 1 TABLET BY MOUTH 3 TIMES DAILY 270 tablet 1   Cholecalciferol (VITAMIN D3) 1.25 MG (50000 UT) CAPS Take by mouth.     cycloSPORINE (RESTASIS) 0.05 % ophthalmic emulsion Administer 1 drop to both eyes Two (2) times a day.     estradiol-norethindrone (ACTIVELLA) 1-0.5 MG tablet Take 1 tablet by mouth daily. 28 tablet 0   fexofenadine (ALLEGRA ALLERGY) 180 MG tablet Take 1 tablet (180 mg total) by mouth daily. 90 tablet 1   fluticasone (VERAMYST) 27.5 MCG/SPRAY nasal spray Place 2 sprays into the nose daily.     montelukast (SINGULAIR) 10 MG  tablet Take 1 tablet (10 mg total) by mouth at bedtime. 30 tablet 0   Multiple Vitamin (MULTIVITAMIN) capsule Take 1 capsule by mouth daily.     nystatin (MYCOSTATIN) 100000 UNIT/ML suspension Take 5 mLs (500,000 Units total) by mouth 4 (four) times daily. 240 mL 0   Omega-3 Fatty Acids (FISH OIL) 1000 MG CAPS Take by mouth.     sertraline (ZOLOFT) 100 MG tablet Take 1 tablet (100 mg total) by mouth daily. 30 tablet 0   SYMBICORT 160-4.5 MCG/ACT inhaler Inhale 2 puffs into the lungs 2 (two) times daily.     No current facility-administered medications for this visit.    Allergies  Allergen Reactions   Cephalexin Other (See Comments)    Other reaction(s): Abdominal Pain Other reaction(s): Abdominal Pain    Erythromycin     nausea   Latex Other (See Comments)    Unsure if allergic   Penicillins     "childhood allergy"   Sulfamethoxazole-Trimethoprim Rash    Family History  Problem Relation Age of Onset   Atrial fibrillation Mother    Heart disease Mother    Lung cancer Father    Stroke Maternal Grandmother    Breast cancer Paternal Grandmother    Hypertension Paternal Grandmother    Breast cancer Maternal Aunt     Social History   Socioeconomic History   Marital status: Married  Spouse name: Not on file   Number of children: Not on file   Years of education: Not on file   Highest education level: Not on file  Occupational History   Not on file  Tobacco Use   Smoking status: Never   Smokeless tobacco: Never  Vaping Use   Vaping Use: Never used  Substance and Sexual Activity   Alcohol use: Never   Drug use: Never   Sexual activity: Yes    Birth control/protection: None    Comment: Married  Other Topics Concern   Not on file  Social History Narrative   Not on file   Social Determinants of Health   Financial Resource Strain: Not on file  Food Insecurity: Not on file  Transportation Needs: Not on file  Physical Activity: Not on file  Stress: Not on file   Social Connections: Not on file  Intimate Partner Violence: Not on file     Constitutional: Denies fever, malaise, fatigue, headache or abrupt weight changes.  HEENT: Denies eye pain, eye redness, ear pain, ringing in the ears, wax buildup, runny nose, nasal congestion, bloody nose, or sore throat. Respiratory: Denies difficulty breathing, shortness of breath, cough or sputum production.   Cardiovascular: Denies chest pain, chest tightness, palpitations or swelling in the hands or feet.  Gastrointestinal: Denies abdominal pain, bloating, constipation, diarrhea or blood in the stool.  GU: Pt reports vaginal irritation. Denies urgency, frequency, pain with urination, burning sensation, blood in urine, odor or discharge. Musculoskeletal: Patient reports back pain.  Denies decrease in range of motion, difficulty with gait, and or joint swelling.  Skin: Denies redness, rashes, lesions or ulcercations.  Neurological: Denies dizziness, difficulty with memory, difficulty with speech or problems with balance and coordination.  Psych: Patient has a history of anxiety and depression.  Denies SI/HI.  No other specific complaints in a complete review of systems (except as listed in HPI above).  Objective:   Physical Exam  BP 126/72 (BP Location: Left Arm, Patient Position: Sitting, Cuff Size: Normal)   Pulse 84   Temp (!) 96.7 F (35.9 C) (Temporal)   Ht 5\' 6"  (1.676 m)   Wt 138 lb (62.6 kg)   SpO2 100%   BMI 22.27 kg/m   Wt Readings from Last 3 Encounters:  08/07/22 134 lb (60.8 kg)  07/18/22 136 lb (61.7 kg)  06/05/22 135 lb (61.2 kg)    General: Appears her stated age, well developed, well nourished in NAD. Skin: Warm, dry and intact.  HEENT: Head: normal shape and size; Eyes: sclera white, no icterus, conjunctiva pink, PERRLA and EOMs intact;  Neck:  Neck supple, trachea midline. No masses, lumps or thyromegaly present.  Cardiovascular: Normal rate and rhythm. S1,S2 noted.  No  murmur, rubs or gallops noted. No JVD or BLE edema. No carotid bruits noted. Pulmonary/Chest: Normal effort and positive vesicular breath sounds. No respiratory distress. No wheezes, rales or ronchi noted.  Abdomen: Normal bowel sounds.  Musculoskeletal: Strength 5/5 BUE/BLE. No difficulty with gait.  Neurological: Alert and oriented. Cranial nerves II-XII grossly intact. Coordination normal.  Psychiatric: Mood and affect normal. Behavior is normal. Judgment and thought content normal.     BMET    Component Value Date/Time   NA 140 12/05/2021 1355   NA 142 09/12/2020 1549   K 3.7 12/05/2021 1355   CL 107 12/05/2021 1355   CO2 25 12/05/2021 1355   GLUCOSE 88 12/05/2021 1355   BUN 11 12/05/2021 1355   BUN 8  09/12/2020 1549   CREATININE 0.98 12/05/2021 1355   CALCIUM 9.3 12/05/2021 1355   GFRNONAA 74 11/20/2020 0959   GFRAA 86 11/20/2020 0959    Lipid Panel     Component Value Date/Time   CHOL 181 12/05/2021 1355   TRIG 45 12/05/2021 1355   HDL 53 12/05/2021 1355   CHOLHDL 3.4 12/05/2021 1355   LDLCALC 115 (H) 12/05/2021 1355    CBC    Component Value Date/Time   WBC 5.0 12/05/2021 1355   RBC 4.18 12/05/2021 1355   HGB 12.0 12/05/2021 1355   HGB 13.1 09/12/2020 1549   HCT 37.4 12/05/2021 1355   HCT 39.2 09/12/2020 1549   PLT 190 12/05/2021 1355   PLT 218 09/12/2020 1549   MCV 89.5 12/05/2021 1355   MCV 89 09/12/2020 1549   MCH 28.7 12/05/2021 1355   MCHC 32.1 12/05/2021 1355   RDW 13.1 12/05/2021 1355   RDW 13.2 09/12/2020 1549   LYMPHSABS 1,080 08/08/2021 1327   EOSABS 12 (L) 08/08/2021 1327   BASOSABS 18 08/08/2021 1327    Hgb A1C No results found for: "HGBA1C"         Assessment & Plan:   Preventative Health Maintenance:  Encouraged her to get a flu shot in the fall Tetanus UTD Encouraged her to get her COVID booster Pneumovax and Prevnar UTD Zostavax UTD Encouraged her to get her second Shingrix vaccine Pap smear UTD Mammogram ordered-she  will call to schedule Bone density UTD  colon screening UTD Encouraged her to consume a balanced diet and exercise regimen Advised her to see an eye doctor and dentist annually Will check CBC, c-Met and lipid profile today  Vaginal Irritation:  Will check wet prep Poct urinalysis  Back Pain:  Encourage regular stretching  RTC in 6 months, follow-up chronic conditions Webb Silversmith, NP

## 2023-01-23 ENCOUNTER — Encounter: Payer: Self-pay | Admitting: Internal Medicine

## 2023-01-23 LAB — LIPID PANEL
Cholesterol: 155 mg/dL (ref <200)
HDL: 49 mg/dL — ABNORMAL LOW (ref >=50)
LDL Cholesterol (Calc): 88 mg/dL (calc)
Non-HDL Cholesterol (Calc): 106 mg/dL (calc) (ref <130)
Total CHOL/HDL Ratio: 3.2 (calc) (ref <5.0)
Triglycerides: 88 mg/dL (ref <150)

## 2023-01-23 LAB — VITAMIN D 25 HYDROXY (VIT D DEFICIENCY, FRACTURES): Vit D, 25-Hydroxy: 45 ng/mL (ref 30–100)

## 2023-01-23 LAB — COMPLETE METABOLIC PANEL WITH GFR
AG Ratio: 1.6 (calc) (ref 1.0–2.5)
ALT: 10 U/L (ref 6–29)
AST: 16 U/L (ref 10–35)
Albumin: 4.2 g/dL (ref 3.6–5.1)
Alkaline phosphatase (APISO): 61 U/L (ref 37–153)
BUN: 12 mg/dL (ref 7–25)
CO2: 25 mmol/L (ref 20–32)
Calcium: 9.1 mg/dL (ref 8.6–10.4)
Chloride: 109 mmol/L (ref 98–110)
Creat: 0.9 mg/dL (ref 0.50–1.03)
Globulin: 2.7 g/dL (calc) (ref 1.9–3.7)
Glucose, Bld: 89 mg/dL (ref 65–139)
Potassium: 4.4 mmol/L (ref 3.5–5.3)
Sodium: 140 mmol/L (ref 135–146)
Total Bilirubin: 0.4 mg/dL (ref 0.2–1.2)
Total Protein: 6.9 g/dL (ref 6.1–8.1)
eGFR: 74 mL/min/{1.73_m2} (ref >=60)

## 2023-01-23 LAB — CBC
HCT: 36.8 % (ref 35.0–45.0)
Hemoglobin: 12.1 g/dL (ref 11.7–15.5)
MCH: 28.9 pg (ref 27.0–33.0)
MCHC: 32.9 g/dL (ref 32.0–36.0)
MCV: 87.8 fL (ref 80.0–100.0)
MPV: 11 fL (ref 7.5–12.5)
Platelets: 201 10*3/uL (ref 140–400)
RBC: 4.19 10*6/uL (ref 3.80–5.10)
RDW: 13.4 % (ref 11.0–15.0)
WBC: 4.9 10*3/uL (ref 3.8–10.8)

## 2023-01-23 LAB — TSH: TSH: 2.55 mIU/L (ref 0.40–4.50)

## 2023-01-24 LAB — URINE CULTURE
MICRO NUMBER:: 14755865
Result:: NO GROWTH
SPECIMEN QUALITY:: ADEQUATE

## 2023-01-25 ENCOUNTER — Encounter: Payer: Self-pay | Admitting: Internal Medicine

## 2023-01-26 LAB — CERVICOVAGINAL ANCILLARY ONLY
Bacterial Vaginitis (gardnerella): POSITIVE — AB
Candida Glabrata: NEGATIVE
Candida Vaginitis: NEGATIVE
Comment: NEGATIVE
Comment: NEGATIVE
Comment: NEGATIVE

## 2023-01-27 ENCOUNTER — Encounter: Payer: Self-pay | Admitting: Internal Medicine

## 2023-01-27 MED ORDER — METRONIDAZOLE 0.75 % VA GEL: 1.0000 | Freq: Two times a day (BID) | VAGINAL | 0 refills | Status: DC

## 2023-01-27 NOTE — Addendum Note (Signed)
Addended by: Jearld Fenton on: 01/27/2023 07:44 AM   Modules accepted: Orders

## 2023-02-04 ENCOUNTER — Encounter: Payer: Self-pay | Admitting: Internal Medicine

## 2023-02-04 DIAGNOSIS — N898 Other specified noninflammatory disorders of vagina: Secondary | ICD-10-CM

## 2023-02-04 MED ORDER — CLOBETASOL PROPIONATE 0.05 % EX CREA: 1.0000 | TOPICAL_CREAM | Freq: Two times a day (BID) | CUTANEOUS | 0 refills | Status: DC

## 2023-02-12 NOTE — Addendum Note (Signed)
Addended by: Lorre Munroe on: 02/12/2023 09:26 AM   Modules accepted: Orders

## 2023-02-20 ENCOUNTER — Encounter: Payer: Self-pay | Admitting: Obstetrics and Gynecology

## 2023-03-20 ENCOUNTER — Encounter: Payer: Self-pay | Admitting: Certified Nurse Midwife

## 2023-03-20 ENCOUNTER — Ambulatory Visit (INDEPENDENT_AMBULATORY_CARE_PROVIDER_SITE_OTHER): Payer: BC Managed Care – PPO | Admitting: Certified Nurse Midwife

## 2023-03-20 VITALS — BP 126/80 | HR 84 | Ht 66.0 in | Wt 141.0 lb

## 2023-03-20 DIAGNOSIS — Z01419 Encounter for gynecological examination (general) (routine) without abnormal findings: Secondary | ICD-10-CM | POA: Diagnosis not present

## 2023-03-20 NOTE — Progress Notes (Signed)
GYNECOLOGY ANNUAL PREVENTATIVE CARE ENCOUNTER NOTE  History:     Jennifer Hubbard is a 62 y.o. G0P0000 female here for a routine annual gynecologic exam.  Current complaints: recurrent vaginosis .   Denies abnormal vaginal bleeding, discharge, pelvic pain, problems with intercourse or other gynecologic concerns.     Social Relationship: married  Living: spouse and 2 adopted children 13/19 yr old  Work: Systems developer  Exercise: walking 20 min 3-4 x wk.  Smoke/Alcohol/drug use: denies use   Gynecologic History No LMP recorded. Patient has had an ablation. Contraception: OCP (estrogen/progesterone) HRT Last Pap: 2023. Results were: normal  Last mammogram: 01/20/2022. Results were: normal  Obstetric History OB History  Gravida Para Term Preterm AB Living  0 0 0 0 0 0  SAB IAB Ectopic Multiple Live Births  0 0 0 0 0    Past Medical History:  Diagnosis Date   Allergy    Anemia    Asthma    Bronchiectasis (HCC)    Cervical dystonia    Depression    IBS (irritable bowel syndrome) 09/10/2020   Sleep apnea     Past Surgical History:  Procedure Laterality Date   BREAST CYST ASPIRATION Left    neg   ENDOSCOPIC CONCHA BULLOSA RESECTION Right 06/07/2020   Procedure: ENDOSCOPIC CONCHA BULLOSA RESECTION;  Surgeon: Vernie Murders, MD;  Location: [facility];  Service: ENT;  Laterality: Right;   ESOPHAGOGASTRODUODENOSCOPY (EGD) WITH PROPOFOL N/A 06/06/2021   Procedure: ESOPHAGOGASTRODUODENOSCOPY (EGD) WITH PROPOFOL;  Surgeon: Pasty Spillers, MD;  Location: [facility];  Service: Endoscopy;  Laterality: N/A;   EYE SURGERY     GASTRIC BYPASS     LAPAROSCOPIC OOPHERECTOMY     NASAL SEPTOPLASTY W/ TURBINOPLASTY Bilateral 06/07/2020   Procedure: NASAL SEPTOPLASTY WITH INFERIOR TURBINATE REDUCTION;  Surgeon: Vernie Murders, MD;  Location: [facility];  Service: ENT;  Laterality: Bilateral;    Current Outpatient Medications on File Prior to Visit  Medication  Sig Dispense Refill   albuterol (VENTOLIN HFA) 108 (90 Base) MCG/ACT inhaler Inhale into the lungs every 6 (six) hours as needed for wheezing or shortness of breath.     busPIRone (BUSPAR) 10 MG tablet TAKE 1 TABLET BY MOUTH 3 TIMES DAILY 270 tablet 1   Cholecalciferol (VITAMIN D) 125 MCG (5000 UT) CAPS Take by mouth.     clobetasol cream (TEMOVATE) 0.05 % Apply 1 Application topically 2 (two) times daily. 30 g 0   cycloSPORINE (RESTASIS) 0.05 % ophthalmic emulsion Administer 1 drop to both eyes Two (2) times a day.     estradiol-norethindrone (ACTIVELLA) 1-0.5 MG tablet Take 1 tablet by mouth daily. 28 tablet 0   fexofenadine (ALLEGRA ALLERGY) 180 MG tablet Take 1 tablet (180 mg total) by mouth daily. 90 tablet 1   fluticasone (VERAMYST) 27.5 MCG/SPRAY nasal spray Place 2 sprays into the nose daily.     metroNIDAZOLE (METROGEL) 0.75 % vaginal gel Place 1 Applicatorful vaginally 2 (two) times daily. 70 g 0   montelukast (SINGULAIR) 10 MG tablet Take 1 tablet (10 mg total) by mouth at bedtime. 90 tablet 1   Multiple Vitamin (MULTIVITAMIN) capsule Take 1 capsule by mouth daily.     Omega-3 Fatty Acids (FISH OIL) 1000 MG CAPS Take by mouth.     sertraline (ZOLOFT) 100 MG tablet Take 1 tablet (100 mg total) by mouth daily. 90 tablet 1   No current facility-administered medications on file prior to visit.  Allergies  Allergen Reactions   Cephalexin Other (See Comments)    Other reaction(s): Abdominal Pain Other reaction(s): Abdominal Pain    Erythromycin     nausea   Latex Other (See Comments)    Unsure if allergic   Penicillins     "childhood allergy"   Sulfamethoxazole-Trimethoprim Rash    Social History:  reports that she has never smoked. She has never used smokeless tobacco. She reports that she does not drink alcohol and does not use drugs.  Family History  Problem Relation Age of Onset   Atrial fibrillation Mother    Heart disease Mother    Lung cancer Father    Stroke  Maternal Grandmother    Breast cancer Paternal Grandmother    Hypertension Paternal Grandmother    Breast cancer Maternal Aunt     The following portions of the patient's history were reviewed and updated as appropriate: allergies, current medications, past family history, past medical history, past social history, past surgical history and problem list.  Review of Systems Pertinent items noted in HPI and remainder of comprehensive ROS otherwise negative.  Physical Exam:  BP 126/80   Pulse 84   Ht 5\' 6"  (1.676 m)   Wt 141 lb (64 kg)   BMI 22.76 kg/m  CONSTITUTIONAL: Well-developed, well-nourished female in no acute distress.  HENT:  Normocephalic, atraumatic, External right and left ear normal. Oropharynx is clear and moist EYES: Conjunctivae and EOM are normal. Pupils are equal, round, and reactive to light. No scleral icterus.  NECK: Normal range of motion, supple, no masses.  Normal thyroid.  SKIN: Skin is warm and dry. No rash noted. Not diaphoretic. No erythema. No pallor. MUSCULOSKELETAL: Normal range of motion. No tenderness.  No cyanosis, clubbing, or edema.  2+ distal pulses. NEUROLOGIC: Alert and oriented to person, place, and time. Normal reflexes, muscle tone coordination.  PSYCHIATRIC: Normal mood and affect. Normal behavior. Normal judgment and thought content. CARDIOVASCULAR: Normal heart rate noted, regular rhythm RESPIRATORY: Clear to auscultation bilaterally. Effort and breath sounds normal, no problems with respiration noted. BREASTS: Symmetric in size. No masses, tenderness, skin changes, nipple drainage, or lymphadenopathy bilaterally.  ABDOMEN: Soft, no distention noted.  No tenderness, rebound or guarding.  PELVIC: Normal appearing external genitalia and urethral meatus; normal appearing vaginal mucosa and cervix.  No abnormal discharge noted.  Pap smear not due  Normal uterine size, no other palpable masses, no uterine or adnexal tenderness.  .   Assessment and  Plan:  Annual GYN exam   Pap: not due  Mammogram : scheduled 03/2023 Labs: none due Refills: none Referral:none Pt using boric acid prn that has improved occurrence BV and yeast.  Routine preventative health maintenance measures emphasized. Please refer to After Visit Summary for other counseling recommendations.      Doreene Burke, CNM [facility]  [facility],  [facility]

## 2023-03-30 ENCOUNTER — Encounter: Payer: Self-pay | Admitting: Internal Medicine

## 2023-03-30 ENCOUNTER — Ambulatory Visit (INDEPENDENT_AMBULATORY_CARE_PROVIDER_SITE_OTHER): Payer: BC Managed Care – PPO | Admitting: Internal Medicine

## 2023-03-30 ENCOUNTER — Ambulatory Visit: Payer: BC Managed Care – PPO

## 2023-03-30 VITALS — BP 124/82 | HR 82 | Temp 97.1°F | Wt 140.0 lb

## 2023-03-30 DIAGNOSIS — B9789 Other viral agents as the cause of diseases classified elsewhere: Secondary | ICD-10-CM | POA: Diagnosis not present

## 2023-03-30 DIAGNOSIS — J470 Bronchiectasis with acute lower respiratory infection: Secondary | ICD-10-CM

## 2023-03-30 DIAGNOSIS — J329 Chronic sinusitis, unspecified: Secondary | ICD-10-CM

## 2023-03-30 MED ORDER — PREDNISONE 10 MG PO TABS: ORAL_TABLET | ORAL | 0 refills | Status: DC

## 2023-03-30 MED ORDER — DOXYCYCLINE HYCLATE 100 MG PO TABS: 100.0000 mg | ORAL_TABLET | Freq: Two times a day (BID) | ORAL | 0 refills | Status: DC

## 2023-03-30 NOTE — Progress Notes (Signed)
HPI  Pt presents to the clinic today with c/o fatigue, sinus pressure, nasal congestion, sore throat and cough. This started 2 days ago. She is blowing yellow mucous out of her nose. She denies difficulty swallowing. The cough is productive of yellow mucous. She denies ear pain, shortness of breath, nausea, vomiting or diarrhea. She denies fever, chills or body aches. She has a history of bronchiectasis, managed on Singulair, Veramyst and Albuterol. She has not had sick contacts that she is aware of.  Review of Systems      Past Medical History:  Diagnosis Date   Allergy    Anemia    Asthma    Bronchiectasis (HCC)    Cervical dystonia    Depression    IBS (irritable bowel syndrome) 09/10/2020   Sleep apnea     Family History  Problem Relation Age of Onset   Atrial fibrillation Mother    Heart disease Mother    Lung cancer Father    Stroke Maternal Grandmother    Breast cancer Paternal Grandmother    Hypertension Paternal Grandmother    Breast cancer Maternal Aunt     Social History   Socioeconomic History   Marital status: Married    Spouse name: Not on file   Number of children: Not on file   Years of education: Not on file   Highest education level: Not on file  Occupational History   Not on file  Tobacco Use   Smoking status: Never   Smokeless tobacco: Never  Vaping Use   Vaping Use: Never used  Substance and Sexual Activity   Alcohol use: Never   Drug use: Never   Sexual activity: Yes    Birth control/protection: None    Comment: Married  Other Topics Concern   Not on file  Social History Narrative   Not on file   Social Determinants of Health   Financial Resource Strain: Not on file  Food Insecurity: Not on file  Transportation Needs: Not on file  Physical Activity: Not on file  Stress: Not on file  Social Connections: Not on file  Intimate Partner Violence: Not on file    Allergies  Allergen Reactions   Cephalexin Other (See Comments)     Other reaction(s): Abdominal Pain Other reaction(s): Abdominal Pain    Erythromycin     nausea   Latex Other (See Comments)    Unsure if allergic   Penicillins     "childhood allergy"   Sulfamethoxazole-Trimethoprim Rash     Constitutional: Positive headache, fatigue. Denies fever or abrupt weight changes.  HEENT:  Positive sinus pressure, ear fullness, nasal congestion and sore throat. Denies eye redness, eye pain, pressure behind the eyes, ear pain, ringing in the ears, wax buildup, runny nose or bloody nose. Respiratory: Positive cough. Denies difficulty breathing or shortness of breath.  Cardiovascular: Denies chest pain, chest tightness, palpitations or swelling in the hands or feet.   No other specific complaints in a complete review of systems (except as listed in HPI above).  Objective:   BP 124/82 (BP Location: Left Arm, Patient Position: Sitting, Cuff Size: Normal)   Pulse 82   Temp (!) 97.1 F (36.2 C) (Temporal)   Wt 140 lb (63.5 kg)   BMI 22.60 kg/m   Wt Readings from Last 3 Encounters:  03/20/23 141 lb (64 kg)  01/22/23 138 lb (62.6 kg)  08/07/22 134 lb (60.8 kg)     General: Appears her stated age, appears unwell but in NAD.  HEENT: Head: normal shape and size, maxillary sinus tenderness noted; Eyes: sclera white, no icterus, conjunctiva pink; Ears: Tm's gray and intact, normal light reflex; Nose: mucosa pink and moist, septum midline; Throat/Mouth: + PND. Teeth present, mucosa erythematous and moist, no exudate noted, no lesions or ulcerations noted.  Neck: No cervical lymphadenopathy.  Cardiovascular: Normal rate and rhythm. S1,S2 noted.  No murmur, rubs or gallops noted.  Pulmonary/Chest: Normal effort and positive vesicular breath sounds. No respiratory distress. No wheezes, rales or ronchi noted.       Assessment & Plan:   Viral Sinusitis/Bronchiectasis:  Get some rest and drink plenty of water Do salt water gargles for the sore throat Can do a  neti pot which can be purchased from your local pharmacy Rx for Doxycycline 100 mg twice daily x 10 days Rx for Prednisone taper x 6 days  RTC in 3 months for follow up chronic conditions   Nicki Reaper, NP

## 2023-03-30 NOTE — Patient Instructions (Signed)

## 2023-04-01 ENCOUNTER — Telehealth: Payer: Self-pay

## 2023-04-01 NOTE — Telephone Encounter (Signed)
Copied from CRM [id number]. Topic: General - Other >> Apr 01, 2023  2:40 PM Everette C wrote: Reason for CRM: The patient has called to request contact with their PCP regarding their return to work note   The patient shares that they have had to stay home from work an extra day   The patient would like to return to work on 04/02/23  Please contact further when possible

## 2023-04-01 NOTE — Telephone Encounter (Signed)
Is it okay to extent her note one more day?   Thanks,   -Vernona Rieger

## 2023-04-02 NOTE — Telephone Encounter (Signed)
Work note sent to My Chart.   Thanks,   -Daissy Yerian  

## 2023-04-02 NOTE — Telephone Encounter (Signed)
Ok for work note? 

## 2023-04-08 ENCOUNTER — Telehealth: Payer: Self-pay | Admitting: Internal Medicine

## 2023-04-08 NOTE — Telephone Encounter (Signed)
The patient called in stating she is doing better but not completely over her sickness. The medication truly helped but she is now out of all her meds. She would like to speak with her provider or even Lauren if possible. She states the only time she has available is around 12:40 but if you cannot call then please leave a my chart message. She also uses   [facility] - [place], [place] - [address] Phone: [phone]  Fax: [phone]     If anything else needs to be called in.

## 2023-04-09 ENCOUNTER — Other Ambulatory Visit: Payer: Self-pay | Admitting: Internal Medicine

## 2023-04-09 NOTE — Telephone Encounter (Signed)
She should not need additional antibiotics or prednisone.  If she needs something for cough we could send that in.

## 2023-04-09 NOTE — Telephone Encounter (Signed)
Call dropped prior to transfer.  Called pt back and left message to return our call.

## 2023-04-09 NOTE — Telephone Encounter (Signed)
LMTCB 04/09/2023.  PEC please advise pt when she calls back .   Thanks,   -Vernona Rieger

## 2023-04-17 ENCOUNTER — Other Ambulatory Visit: Payer: Self-pay | Admitting: Internal Medicine

## 2023-04-20 NOTE — Telephone Encounter (Signed)
Requested Prescriptions  Pending Prescriptions Disp Refills   estradiol-norethindrone (ACTIVELLA) 1-0.5 MG tablet [Pharmacy Med Name: ESTRADIOL-NORETHINDRONE ACET 1-0.5] 84 tablet 1    Sig: TAKE 1 TABLET BY MOUTH ONCE DAILY     OB/GYN:  Contraceptives Passed - 04/17/2023  5:31 PM      Passed - Last BP in normal range    BP Readings from Last 1 Encounters:  03/30/23 124/82         Passed - Valid encounter within last 12 months    Recent Outpatient Visits           3 weeks ago Viral sinusitis   [place] [facility] [place], Salvadore Oxford, NP   2 months ago Encounter for general adult medical examination with abnormal findings   [place] [facility] [place], Salvadore Oxford, NP   8 months ago Dysuria   [facility] M[place], [place], [place]   9 months ago Acute cystitis with hematuria   [place] [facility] [place], Salvadore Oxford, NP   10 months ago Bronchiectasis without complication [facility])   [place] [facility] [place], Salvadore Oxford, NP       Future Appointments             In 2 months Baity, Salvadore Oxford, NP [place] [facility], [facility]            Passed - Patient is not a smoker

## 2023-04-29 ENCOUNTER — Other Ambulatory Visit: Payer: Self-pay | Admitting: Internal Medicine

## 2023-04-29 NOTE — Telephone Encounter (Signed)
Requested medication (s) are due for refill today - no  Requested medication (s) are on the active medication list -no  Future visit scheduled -yes  Last refill: no longer on current medication list  Notes to clinic: off protocol- provider review   Requested Prescriptions  Pending Prescriptions Disp Refills   metroNIDAZOLE (METROGEL) 0.75 % vaginal gel [Pharmacy Med Name: METRONIDAZOLE 0.75% VAGINAL GEL GM] 70 g 0    Sig: PLACE 1 APPLICATOFUL VAGINALLY 2X A DAY FOR 5 NIGHTS OR AS DIRECTED BY DOCTOR     Off-Protocol Failed - 04/29/2023  8:05 AM      Failed - Medication not assigned to a protocol, review manually.      Passed - Valid encounter within last 12 months    Recent Outpatient Visits           1 month ago Viral sinusitis   [place] [facility] [place], Salvadore Oxford, NP   3 months ago Encounter for general adult medical examination with abnormal findings   [place] [facility] [place], Salvadore Oxford, NP   8 months ago Dysuria   [place] [facility], [place], [place]   9 months ago Acute cystitis with hematuria   [place] [facility] [place], Salvadore Oxford, NP   10 months ago Bronchiectasis without complication [facility])   [place] [facility] [place], Salvadore Oxford, NP       Future Appointments             In 2 months Baity, Salvadore Oxford, NP [place] [facility], [facility]               Requested Prescriptions  Pending Prescriptions Disp Refills   metroNIDAZOLE (METROGEL) 0.75 % vaginal gel [Pharmacy Med Name: METRONIDAZOLE 0.75% VAGINAL GEL GM] 70 g 0    Sig: PLACE 1 APPLICATOFUL VAGINALLY 2X A DAY FOR 5 NIGHTS OR AS DIRECTED BY DOCTOR     Off-Protocol Failed - 04/29/2023  8:05 AM      Failed - Medication not assigned to a protocol, review manually.      Passed - Valid encounter within last 12 months    Recent Outpatient Visits           1 month ago Viral sinusitis   [facility] [place], Salvadore Oxford, NP   3 months ago Encounter for general adult medical examination with abnormal findings   [place] [place], Salvadore Oxford, NP   8 months ago Dysuria   [facility] [place], Oswaldo [place], [place]   9 months ago Acute cystitis with hematuria   [place] [facility] [place], Salvadore Oxford, NP   10 months ago Bronchiectasis without complication [facility])   [place] [facility] [place], Salvadore Oxford, NP       Future Appointments             In 2 months Baity, Salvadore Oxford, NP [place] [facility], [facility]

## 2023-05-06 ENCOUNTER — Encounter: Payer: Self-pay | Admitting: Certified Nurse Midwife

## 2023-05-06 ENCOUNTER — Other Ambulatory Visit: Payer: Self-pay

## 2023-05-06 MED ORDER — METRONIDAZOLE 0.75 % VA GEL: 1.0000 | Freq: Every day | VAGINAL | 0 refills | Status: AC

## 2023-06-22 ENCOUNTER — Other Ambulatory Visit: Payer: Self-pay | Admitting: Licensed Practical Nurse

## 2023-06-22 ENCOUNTER — Other Ambulatory Visit: Payer: Self-pay | Admitting: Internal Medicine

## 2023-06-23 NOTE — Telephone Encounter (Signed)
Requested Prescriptions  Pending Prescriptions Disp Refills   busPIRone (BUSPAR) 10 MG tablet [Pharmacy Med Name: BUSPIRONE HCL 10 MG TAB] 270 tablet 1    Sig: TAKE 1 TABLET BY MOUTH 3 TIMES DAILY     Psychiatry: Anxiolytics/Hypnotics - Non-controlled Passed - 06/22/2023  5:55 PM      Passed - Valid encounter within last 12 months    Recent Outpatient Visits           2 months ago Viral sinusitis   [place] [facility] [place], Salvadore Oxford, NP   5 months ago Encounter for general adult medical examination with abnormal findings   [place] [facility] [place], Salvadore Oxford, NP   10 months ago Dysuria   [place] [facility] [place]ecum, [place] E, [place]   11 months ago Acute cystitis with hematuria   [place] [facility] [place], Salvadore Oxford, NP   1 year ago Bronchiectasis without complication [facility])   [place] [place], Salvadore Oxford, NP       Future Appointments             In 3 weeks Sam[place] Si, Salvadore Oxford, NP [place] [facility], [facility]             clobetasol cream (TEMOVATE) 0.05 % [Pharmacy Med Name: CLOBETASOL PROPIONATE 0.05% TOP CRE] 60 g     Sig: APPLY TO AFFECTED AREA(s) TWICE DAILY     Not Delegated - Dermatology:  Corticosteroids Failed - 06/22/2023  5:55 PM      Failed - This refill cannot be delegated      Passed - Valid encounter within last 12 months    Recent Outpatient Visits           2 months ago Viral sinusitis   [place] [facility] [place], Salvadore Oxford, NP   5 months ago Encounter for general adult medical examination with abnormal findings   [place] [facility] [place], Salvadore Oxford, NP   10 months ago Dysuria   [place] [facility] [place], Osw[place], [place]   11 months ago Acute cystitis with hematuria   [place] [facility] [place], Salvadore Oxford, NP   1 year ago Bronchiectasis without complication [facility])    [place] [facility] [place], Salvadore Oxford, NP       Future Appointments             In 3 weeks Sampson Si, Salvadore Oxford, NP  [place] [facility], [facility]

## 2023-06-23 NOTE — Telephone Encounter (Signed)
Requested medications are due for refill today.  no  Requested medications are on the active medications list.  no  Last refill. unsure  Future visit scheduled.   yes  Notes to clinic.  Refill refusal not delegated. Rx d/c'd 03/30/2023    Requested Prescriptions  Pending Prescriptions Disp Refills   clobetasol cream (TEMOVATE) 0.05 % [Pharmacy Med Name: CLOBETASOL PROPIONATE 0.05% TOP CRE] 60 g     Sig: APPLY TO AFFECTED AREA(s) TWICE DAILY     Not Delegated - Dermatology:  Corticosteroids Failed - 06/22/2023  5:55 PM      Failed - This refill cannot be delegated      Passed - Valid encounter within last 12 months    Recent Outpatient Visits           2 months ago Viral sinusitis   [place] [facility] [place], Salvadore Oxford, NP   5 months ago Encounter for general adult medical examination with abnormal findings   [place] [facility] [place], Salvadore Oxford, NP   10 months ago Dysuria   [place] [facility], Osw[place], [place]   11 months ago Acute cystitis with hematuria   [place] [facility] [place], Salvadore Oxford, NP   1 year ago Bronchiectasis without complication [facility])   [place] [facility] [place], Salvadore Oxford, NP       Future Appointments             In 3 weeks Sampson Si, Salvadore Oxford, NP [place] [facility], [facility]            Signed Prescriptions Disp Refills   busPIRone (BUSPAR) 10 MG tablet 270 tablet 2    Sig: TAKE 1 TABLET BY MOUTH 3 TIMES DAILY     Psychiatry: Anxiolytics/Hypnotics - Non-controlled Passed - 06/22/2023  5:55 PM      Passed - Valid encounter within last 12 months    Recent Outpatient Visits           2 months ago Viral sinusitis   [place] [facility] [place], Salvadore Oxford, NP   5 months ago Encounter for general adult medical examination with abnormal findings   [place] [facility] [place], Salvadore Oxford, NP   10 months ago  Dysuria   [place] [facility] [place], Oswaldo Conroy, [place]   11 months ago Acute cystitis with hematuria   [place] [facility] [place], Salvadore Oxford, NP   1 year ago Bronchiectasis without complication [facility])   [place] [facility] [place], Salvadore Oxford, NP       Future Appointments             In 3 weeks Sampson Si, Salvadore Oxford, NP  [facility], [facility]

## 2023-07-13 ENCOUNTER — Encounter: Payer: Self-pay | Admitting: Internal Medicine

## 2023-07-14 ENCOUNTER — Ambulatory Visit: Payer: BC Managed Care – PPO | Admitting: Internal Medicine

## 2023-07-17 ENCOUNTER — Inpatient Hospital Stay: Admission: RE | Admit: 2023-07-17 | Payer: BC Managed Care – PPO | Source: Ambulatory Visit

## 2023-07-18 LAB — HM MAMMOGRAPHY

## 2023-07-23 ENCOUNTER — Encounter: Payer: Self-pay | Admitting: Internal Medicine

## 2023-07-23 DIAGNOSIS — Z1239 Encounter for other screening for malignant neoplasm of breast: Secondary | ICD-10-CM

## 2023-07-24 NOTE — Addendum Note (Signed)
Addended by: Lorre Munroe on: 07/24/2023 08:00 AM   Modules accepted: Orders

## 2023-07-28 ENCOUNTER — Telehealth: Payer: Self-pay | Admitting: Internal Medicine

## 2023-07-28 NOTE — Telephone Encounter (Signed)
See response below 

## 2023-07-28 NOTE — Telephone Encounter (Signed)
Pt states Jennifer Hubbard has been trying to get up w/ her for several days.  She needs you to  E Fax the MRI breast order to: [phone].  she states you must put the "1" at the beginning of the fax number. 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 She would like you to communicate w/ her via mychart please.

## 2023-07-28 NOTE — Telephone Encounter (Signed)
Can you please check on this MRI breast order for this patient?

## 2023-07-28 NOTE — Telephone Encounter (Signed)
The referral coordinator is trying to get in contact with pt.  See My Chart message.   Thanks,   -Vernona Rieger

## 2023-08-06 ENCOUNTER — Encounter: Payer: Self-pay | Admitting: Internal Medicine

## 2023-08-11 ENCOUNTER — Other Ambulatory Visit: Payer: Self-pay | Admitting: Internal Medicine

## 2023-08-11 DIAGNOSIS — N6320 Unspecified lump in the left breast, unspecified quadrant: Secondary | ICD-10-CM

## 2023-08-13 ENCOUNTER — Other Ambulatory Visit: Payer: Self-pay | Admitting: Internal Medicine

## 2023-08-13 MED ORDER — SERTRALINE HCL 100 MG PO TABS: 100.0000 mg | ORAL_TABLET | Freq: Every day | ORAL | 1 refills | Status: DC

## 2023-08-13 MED ORDER — FLUTICASONE FUROATE 27.5 MCG/SPRAY NA SUSP: 2.0000 | Freq: Every day | NASAL | 3 refills | Status: DC

## 2023-08-13 MED ORDER — CYCLOSPORINE 0.05 % OP EMUL: 1.0000 [drp] | Freq: Two times a day (BID) | OPHTHALMIC | 2 refills | Status: DC

## 2023-08-13 MED ORDER — BUSPIRONE HCL 10 MG PO TABS: 10.0000 mg | ORAL_TABLET | Freq: Three times a day (TID) | ORAL | 2 refills | Status: DC

## 2023-08-13 MED ORDER — ALBUTEROL SULFATE HFA 108 (90 BASE) MCG/ACT IN AERS: 1.0000 | INHALATION_SPRAY | Freq: Four times a day (QID) | RESPIRATORY_TRACT | 3 refills | Status: DC | PRN

## 2023-08-13 MED ORDER — MONTELUKAST SODIUM 10 MG PO TABS: 10.0000 mg | ORAL_TABLET | Freq: Every day | ORAL | 1 refills | Status: AC

## 2023-08-13 MED ORDER — ESTRADIOL-NORETHINDRONE ACET 1-0.5 MG PO TABS: 1.0000 | ORAL_TABLET | Freq: Every day | ORAL | 1 refills | Status: DC

## 2023-08-13 NOTE — Telephone Encounter (Signed)
Medication Refill - Medication:  sertraline (ZOLOFT) 100 MG tablet  montelukast (SINGULAIR) 10 MG tablet  busPIRone (BUSPAR) 10 MG tablet  fluticasone (VERAMYST) 27.5 MCG/SPRAY nasal spray  lbuterol (VENTOLIN HFA) 108 (90 Base) MCG/ACT inhaler - PATIENT STATES PRO-AIR   *Patient states not completely out but switched pharmacies and pharmacy she is with won't call for it and patient will be out of town on Saturday and won't be back for over a week and needs a refill up until appt date with PCP   Has the patient contacted their pharmacy? Yes, advised to contact PCP  Preferred Pharmacy (with phone number or street name): [id number] - [place], [place] - [address]  Phone: [phone] Fax: [phone]   Has the patient been seen for an appointment in the last year OR does the patient have an upcoming appointment? Yes. F/U with PCP scheduled for 10.31.2024

## 2023-08-13 NOTE — Addendum Note (Signed)
Addended by: Judd Gaudier on: 08/13/2023 11:15 AM   Modules accepted: Orders

## 2023-08-13 NOTE — Telephone Encounter (Signed)
Copied from CRM [id number]. Topic: General - Inquiry >> Aug 11, 2023  2:39 PM Lennox Pippins wrote: Tobi Bastos from [facility] has called requesting two orders for patient. Tobi Bastos wanted to include this fax number # [phone] for these orders.

## 2023-08-27 ENCOUNTER — Ambulatory Visit (INDEPENDENT_AMBULATORY_CARE_PROVIDER_SITE_OTHER): Payer: 59 | Admitting: Internal Medicine

## 2023-08-27 ENCOUNTER — Encounter: Payer: Self-pay | Admitting: Internal Medicine

## 2023-08-27 VITALS — BP 122/74 | Wt 145.0 lb

## 2023-08-27 DIAGNOSIS — M8580 Other specified disorders of bone density and structure, unspecified site: Secondary | ICD-10-CM | POA: Insufficient documentation

## 2023-08-27 DIAGNOSIS — F32A Depression, unspecified: Secondary | ICD-10-CM

## 2023-08-27 DIAGNOSIS — J479 Bronchiectasis, uncomplicated: Secondary | ICD-10-CM | POA: Diagnosis not present

## 2023-08-27 DIAGNOSIS — F419 Anxiety disorder, unspecified: Secondary | ICD-10-CM | POA: Diagnosis not present

## 2023-08-27 DIAGNOSIS — K219 Gastro-esophageal reflux disease without esophagitis: Secondary | ICD-10-CM

## 2023-08-27 DIAGNOSIS — M8589 Other specified disorders of bone density and structure, multiple sites: Secondary | ICD-10-CM | POA: Diagnosis not present

## 2023-08-27 NOTE — Assessment & Plan Note (Signed)
Avoid eating and laying down Continue baking soda OTC

## 2023-08-27 NOTE — Assessment & Plan Note (Signed)
Continue singulair, symbicort, albuterol, mucinex, allegra and flonase She will continue to follow with pulmonology

## 2023-08-27 NOTE — Assessment & Plan Note (Signed)
Continue calcium and vitamin D OTC Encouraged daily weightbearing exercise 

## 2023-08-27 NOTE — Assessment & Plan Note (Addendum)
Stable on current dose of sertraline and buspirone She will continue to see her therapist

## 2023-08-27 NOTE — Progress Notes (Signed)
Subjective:    Patient ID: Jennifer Hubbard, female    DOB: 05-Jan-1961, 62 y.o.   MRN: [id number]  HPI  Patient presents to clinic today for 60-month follow-up of chronic conditions.  Anxiety and depression: Chronic, managed on sertraline and buspirone.  She is seeing a therapist but not a psychiatrist.  She denies SI/HI.  Bronchiectasis: She reports chronic cough but denies shortness of breath.  She is taking singulair, symbicort, albuterol, mucinex, allegra and flonase.  She follows with pulmonology.  There are no PFTs on file.  GERD: Triggered by laying down after eating.  She is taking baking soda OTC with good relief of symptoms.  Upper GI from 05/2021 reviewed.  Osteopenia: She is taking calcium and vit D OTC. She gets weightbearing exercise daily. Bone density from 12/2021 reviewed.  Review of Systems     Past Medical History:  Diagnosis Date   Allergy    Anemia    Asthma    Bronchiectasis (HCC)    Cervical dystonia    Depression    IBS (irritable bowel syndrome) 09/10/2020   Sleep apnea     Current Outpatient Medications  Medication Sig Dispense Refill   albuterol (VENTOLIN HFA) 108 (90 Base) MCG/ACT inhaler Inhale 1 puff into the lungs every 6 (six) hours as needed for wheezing or shortness of breath. Inhale into the lungs every 6 (six) hours as needed for wheezing or shortness of breath. 18 g 3   busPIRone (BUSPAR) 10 MG tablet Take 1 tablet (10 mg total) by mouth 3 (three) times daily. 270 tablet 2   Cholecalciferol (VITAMIN D) 125 MCG (5000 UT) CAPS Take by mouth.     clobetasol cream (TEMOVATE) 0.05 % APPLY TO AFFECTED AREA(s) TWICE DAILY 60 g 0   cycloSPORINE (RESTASIS) 0.05 % ophthalmic emulsion Place 1 drop into both eyes 2 (two) times daily. Administer 1 drop to both eyes Two (2) times a day. 0.4 mL 2   doxycycline (VIBRA-TABS) 100 MG tablet Take 1 tablet (100 mg total) by mouth 2 (two) times daily. 20 tablet 0   estradiol-norethindrone (ACTIVELLA) 1-0.5 MG tablet  Take 1 tablet by mouth daily. 84 tablet 1   fexofenadine (ALLEGRA ALLERGY) 180 MG tablet Take 1 tablet (180 mg total) by mouth daily. 90 tablet 1   fluticasone (VERAMYST) 27.5 MCG/SPRAY nasal spray Place 2 sprays into the nose daily. 10 g 3   montelukast (SINGULAIR) 10 MG tablet Take 1 tablet (10 mg total) by mouth at bedtime. 90 tablet 1   Multiple Vitamin (MULTIVITAMIN) capsule Take 1 capsule by mouth daily.     Omega-3 Fatty Acids (FISH OIL) 1000 MG CAPS Take by mouth.     predniSONE (DELTASONE) 10 MG tablet Take 6 tabs on day 1, 5 tabs on day 2, 4 tabs on day 3, 3 tabs on day 4, 2 tabs on day 5, 1 tab on day 6 21 tablet 0   sertraline (ZOLOFT) 100 MG tablet Take 1 tablet (100 mg total) by mouth daily. 90 tablet 1   No current facility-administered medications for this visit.    Allergies  Allergen Reactions   Cephalexin Other (See Comments)    Other reaction(s): Abdominal Pain Other reaction(s): Abdominal Pain    Erythromycin     nausea   Latex Other (See Comments)    Unsure if allergic   Penicillins     "childhood allergy"   Sulfamethoxazole-Trimethoprim Rash    Family History  Problem Relation Age of Onset  Atrial fibrillation Mother    Heart disease Mother    Lung cancer Father    Stroke Maternal Grandmother    Breast cancer Paternal Grandmother    Hypertension Paternal Grandmother    Breast cancer Maternal Aunt     Social History   Socioeconomic History   Marital status: Married    Spouse name: Not on file   Number of children: Not on file   Years of education: Not on file   Highest education level: Not on file  Occupational History   Not on file  Tobacco Use   Smoking status: Never   Smokeless tobacco: Never  Vaping Use   Vaping status: Never Used  Substance and Sexual Activity   Alcohol use: Never   Drug use: Never   Sexual activity: Yes    Birth control/protection: None    Comment: Married  Other Topics Concern   Not on file  Social History  Narrative   Not on file   Social Determinants of Health   Financial Resource Strain: Not on file  Food Insecurity: Not on file  Transportation Needs: Not on file  Physical Activity: Not on file  Stress: Not on file  Social Connections: Not on file  Intimate Partner Violence: Not on file     Constitutional: Denies fever, malaise, fatigue, headache or abrupt weight changes.  HEENT: Denies eye pain, eye redness, ear pain, ringing in the ears, wax buildup, runny nose, nasal congestion, bloody nose, or sore throat. Respiratory: Patient reports chronic cough.  Denies difficulty breathing, shortness of breath, or sputum production.   Cardiovascular: Denies chest pain, chest tightness, palpitations or swelling in the hands or feet.  Gastrointestinal: Patient reports intermittent reflux.  Denies abdominal pain, bloating, constipation, diarrhea or blood in the stool.  GU: Denies urgency, frequency, pain with urination, burning sensation, blood in urine, odor or discharge. Musculoskeletal: Patient reports intermittent right foot pain.  Denies decrease in range of motion, difficulty with gait, muscle pain or joint swelling.  Skin: Denies redness, rashes, lesions or ulcercations.  Neurological: Denies dizziness, difficulty with memory, difficulty with speech or problems with balance and coordination.  Psych: Patient has a history of anxiety and depression.  Denies SI/HI.  No other specific complaints in a complete review of systems (except as listed in HPI above).  Objective:   Physical Exam   BP 122/74   Wt 145 lb (65.8 kg)   BMI 23.40 kg/m   Wt Readings from Last 3 Encounters:  03/30/23 140 lb (63.5 kg)  03/20/23 141 lb (64 kg)  01/22/23 138 lb (62.6 kg)    General: Appears her stated age, well developed, well nourished in NAD. Skin: Warm, dry and intact.  HEENT: Head: normal shape and size; Eyes: sclera white, no icterus, conjunctiva pink, PERRLA and EOMs intact;  Cardiovascular:  Normal rate and rhythm. S1,S2 noted.  No murmur, rubs or gallops noted. No JVD or BLE edema. No carotid bruits noted. Pulmonary/Chest: Normal effort and positive vesicular breath sounds. No respiratory distress. No wheezes, rales or ronchi noted.  Abdomen: Soft and nontender. Normal bowel sounds.  Musculoskeletal:  No difficulty with gait.  Neurological: Alert and oriented. Coordination normal.  Psychiatric: Mood and affect normal. Behavior is normal. Judgment and thought content normal.     BMET    Component Value Date/Time   NA 140 01/22/2023 1556   NA 142 09/12/2020 1549   K 4.4 01/22/2023 1556   CL 109 01/22/2023 1556   CO2 25 01/22/2023  1556   GLUCOSE 89 01/22/2023 1556   BUN 12 01/22/2023 1556   BUN 8 09/12/2020 1549   CREATININE 0.90 01/22/2023 1556   CALCIUM 9.1 01/22/2023 1556   GFRNONAA 74 11/20/2020 0959   GFRAA 86 11/20/2020 0959    Lipid Panel     Component Value Date/Time   CHOL 155 01/22/2023 1556   TRIG 88 01/22/2023 1556   HDL 49 (L) 01/22/2023 1556   CHOLHDL 3.2 01/22/2023 1556   LDLCALC 88 01/22/2023 1556    CBC    Component Value Date/Time   WBC 4.9 01/22/2023 1556   RBC 4.19 01/22/2023 1556   HGB 12.1 01/22/2023 1556   HGB 13.1 09/12/2020 1549   HCT 36.8 01/22/2023 1556   HCT 39.2 09/12/2020 1549   PLT 201 01/22/2023 1556   PLT 218 09/12/2020 1549   MCV 87.8 01/22/2023 1556   MCV 89 09/12/2020 1549   MCH 28.9 01/22/2023 1556   MCHC 32.9 01/22/2023 1556   RDW 13.4 01/22/2023 1556   RDW 13.2 09/12/2020 1549   LYMPHSABS 1,080 08/08/2021 1327   EOSABS 12 (L) 08/08/2021 1327   BASOSABS 18 08/08/2021 1327    Hgb A1C No results found for: "HGBA1C"         Assessment & Plan:     RTC in 6 months for your annual exam Nicki Reaper, NP

## 2023-08-27 NOTE — Patient Instructions (Signed)
Bronchiectasis  Bronchiectasis is a condition in which the airways in the lungs (bronchi) are damaged and widened. The condition makes it hard for the lungs to get rid of mucus, and it causes mucus to gather in the bronchi. This condition often leads to lung infections, which can make the condition worse. What are the causes? You can be born with this condition, or you can develop it later in life. Common causes of this condition include: Cystic fibrosis. Repeated lung infections, such as pneumonia or tuberculosis. An object or other blockage in the lungs. Breathing in fluid, food, or other objects (aspiration). A problem with the body's defense system (immune system) and lung structure that is present at birth (congenital). Sometimes the cause is not known. What are the signs or symptoms? Common symptoms of this condition include: A daily cough that brings up mucus and lasts for more than 3 weeks. Lung infections that happen often. Shortness of breath and wheezing. Weakness and feeling tired (fatigue). How is this diagnosed? This condition is diagnosed with tests, such as: Chest X-rays or CT scans. These are done to check for changes in the lungs. Breathing tests. These are done to check how well your lungs are working. A test of a sample of your saliva (sputum culture). This test is done to check for infection. Blood tests and other tests. These are done to check for related diseases or causes. How is this treated? Treatment for this condition depends on the severity of the illness and its cause. Treatment may include: Medicines that loosen mucus so it can be coughed up (mucolytics). Medicines that relax the muscles of the bronchi (bronchodilators). Antibiotic medicines to prevent or treat infection. Physical therapy to help clear mucus from the lungs. Techniques may include: Postural drainage. This is when you sit or lie in certain positions so that mucus can drain by gravity. Chest  percussion. This involves tapping the chest or back with a cupped hand. Chest vibration. For this therapy, a hand or special equipment vibrates your chest and back. Surgery to remove the affected part of the lung. This may be done in severe cases. Follow these instructions at home: Medicines Take over-the-counter and prescription medicines only as told by your health care provider. If you were prescribed an antibiotic medicine, take it as told by your health care provider. Do not stop taking the antibiotic even if you start to feel better. Avoid taking sedatives and antihistamines unless your health care provider tells you to take them. These medicines tend to thicken the mucus in the lungs. Managing symptoms Do breathing exercises or techniques to clear your lungs as told by your health care provider. Consider using a cold steam vaporizer or humidifier in your room or home to help loosen secretions. If you have a cough that gets worse at night, try sleeping in a semi-upright position. General instructions Get plenty of rest. Drink enough fluid to keep your urine pale yellow. Stay inside when pollution and ozone levels are high. Stay up to date with vaccinations and immunizations. Avoid cigarette smoke and other lung irritants. Do not use any products that contain nicotine or tobacco. These products include cigarettes, chewing tobacco, and vaping devices, such as e-cigarettes. If you need help quitting, ask your health care provider. Keep all follow-up visits. This is important. Contact a health care provider if: You cough up more sputum than before and the sputum is yellow or green in color. You have a fever or chills. You cannot control your  cough and are losing sleep. Get help right away if: You cough up blood. You have chest pain. You have increasing shortness of breath. You have pain that gets worse or is not controlled with medicines. You have a fever and your symptoms suddenly get  worse. These symptoms may be an emergency. Get help right away. Call 911. Do not wait to see if the symptoms will go away. Do not drive yourself to the hospital. Summary Bronchiectasis is a condition in which the airways in the lungs (bronchi) are damaged and widened. The condition makes it hard for the lungs to get rid of mucus, and it causes mucus to gather in the bronchi. Treatment usually includes therapy to help clear mucus from the lungs. Avoid cigarette smoke and other lung irritants. Stay up to date with vaccinations and immunizations. This information is not intended to replace advice given to you by your health care provider. Make sure you discuss any questions you have with your health care provider. Document Revised: 06/26/2021 Document Reviewed: 05/14/2021 Elsevier Patient Education  2024 [employer].

## 2023-09-01 ENCOUNTER — Encounter: Payer: Self-pay | Admitting: Internal Medicine

## 2023-09-01 ENCOUNTER — Telehealth: Payer: Self-pay

## 2023-09-01 DIAGNOSIS — Z136 Encounter for screening for cardiovascular disorders: Secondary | ICD-10-CM

## 2023-09-01 DIAGNOSIS — Z780 Asymptomatic menopausal state: Secondary | ICD-10-CM

## 2023-09-01 NOTE — Telephone Encounter (Signed)
Received call from [facility] at [facility] and radiology.   Kenn[facility] called to report results of diagnostic US biopsy. Results are DCIS - pt is aware.   [facility] has faxed this report to the office. She has also faxed a request for MRI biopsy order.   Tracey requests that this ordered be signed by provider and faxed back to her at [phone].  Phone number for Kennith Center is [phone] Ext [phone].

## 2023-09-03 MED ORDER — DIAZEPAM 5 MG PO TABS: ORAL_TABLET | ORAL | 0 refills | Status: DC

## 2023-09-03 NOTE — Addendum Note (Signed)
Addended by: Lorre Munroe on: 09/03/2023 01:13 PM   Modules accepted: Orders

## 2023-09-15 ENCOUNTER — Telehealth: Payer: Self-pay

## 2023-09-15 NOTE — Telephone Encounter (Signed)
Copied from CRM [id number]. Topic: Clinical - Lab/Test Results >> Sep 15, 2023  2:40 PM Albin Felling L wrote: Reason for CRM: Lambert Mody calling to go over pt's biopsy results.  Best callback number: [phone]

## 2023-09-16 NOTE — Telephone Encounter (Signed)
I have seen the biopsy results by fax.  I will reach out to [facility] if I have any additional questions

## 2023-09-22 NOTE — Addendum Note (Signed)
Addended by: Lorre Munroe on: 09/22/2023 01:46 PM   Modules accepted: Orders

## 2023-09-23 NOTE — Addendum Note (Signed)
Addended by: Lorre Munroe on: 09/23/2023 09:29 AM   Modules accepted: Orders

## 2023-09-28 ENCOUNTER — Telehealth: Payer: Self-pay | Admitting: Internal Medicine

## 2023-09-28 NOTE — Telephone Encounter (Signed)
Patient called in regards to an order for Cardiac Calcium Scoring and Bone Density. Patient states she needs these orders E-FAXED today, 12.2.2024, to E-FAX # [phone] so she can get these orders/test done on her lunch break today. Please advise.   Patient's callback # [phone]

## 2023-09-28 NOTE — Telephone Encounter (Signed)
These test have already been ordered.  Okay to fax to appropriate imaging center which I believe is [facility].

## 2023-09-28 NOTE — Telephone Encounter (Signed)
Faxed to provided number  

## 2023-10-22 LAB — HM DEXA SCAN

## 2023-10-23 ENCOUNTER — Encounter: Payer: Self-pay | Admitting: Internal Medicine

## 2023-11-10 NOTE — Telephone Encounter (Signed)
 I have seen the mammogram results but not the bone density.  We can request them from [facility].

## 2023-11-10 NOTE — Telephone Encounter (Signed)
 Request has been sent

## 2023-11-19 ENCOUNTER — Encounter: Payer: Self-pay | Admitting: Certified Nurse Midwife

## 2023-11-19 NOTE — Telephone Encounter (Signed)
I still have not seen these results

## 2024-01-05 ENCOUNTER — Encounter: Payer: Self-pay | Admitting: Internal Medicine

## 2024-01-05 ENCOUNTER — Ambulatory Visit (INDEPENDENT_AMBULATORY_CARE_PROVIDER_SITE_OTHER): Payer: Self-pay | Admitting: Internal Medicine

## 2024-01-05 VITALS — BP 102/68 | Ht 66.0 in | Wt 153.2 lb

## 2024-01-05 DIAGNOSIS — R739 Hyperglycemia, unspecified: Secondary | ICD-10-CM

## 2024-01-05 DIAGNOSIS — Z0001 Encounter for general adult medical examination with abnormal findings: Secondary | ICD-10-CM | POA: Diagnosis not present

## 2024-01-05 DIAGNOSIS — E559 Vitamin D deficiency, unspecified: Secondary | ICD-10-CM

## 2024-01-05 DIAGNOSIS — Z136 Encounter for screening for cardiovascular disorders: Secondary | ICD-10-CM

## 2024-01-05 DIAGNOSIS — R413 Other amnesia: Secondary | ICD-10-CM

## 2024-01-05 DIAGNOSIS — Z1211 Encounter for screening for malignant neoplasm of colon: Secondary | ICD-10-CM | POA: Diagnosis not present

## 2024-01-05 DIAGNOSIS — D500 Iron deficiency anemia secondary to blood loss (chronic): Secondary | ICD-10-CM | POA: Diagnosis not present

## 2024-01-05 NOTE — Patient Instructions (Signed)
 Health Maintenance for Postmenopausal Women Menopause is a normal process in which your ability to get pregnant comes to an end. This process happens slowly over many months or years, usually between the ages of 24 and 62. Menopause is complete when you have missed your menstrual period for 12 months. It is important to talk with your health care provider about some of the most common conditions that affect women after menopause (postmenopausal women). These include heart disease, cancer, and bone loss (osteoporosis). Adopting a healthy lifestyle and getting preventive care can help to promote your health and wellness. The actions you take can also lower your chances of developing some of these common conditions. What are the signs and symptoms of menopause? During menopause, you may have the following symptoms: Hot flashes. These can be moderate or severe. Night sweats. Decrease in sex drive. Mood swings. Headaches. Tiredness (fatigue). Irritability. Memory problems. Problems falling asleep or staying asleep. Talk with your health care provider about treatment options for your symptoms. Do I need hormone replacement therapy? Hormone replacement therapy is effective in treating symptoms that are caused by menopause, such as hot flashes and night sweats. Hormone replacement carries certain risks, especially as you become older. If you are thinking about using estrogen or estrogen with progestin, discuss the benefits and risks with your health care provider. How can I reduce my risk for heart disease and stroke? The risk of heart disease, heart attack, and stroke increases as you age. One of the causes may be a change in the body's hormones during menopause. This can affect how your body uses dietary fats, triglycerides, and cholesterol. Heart attack and stroke are medical emergencies. There are many things that you can do to help prevent heart disease and stroke. Watch your blood pressure High  blood pressure causes heart disease and increases the risk of stroke. This is more likely to develop in people who have high blood pressure readings or are overweight. Have your blood pressure checked: Every 3-5 years if you are 50-75 years of age. Every year if you are 77 years old or older. Eat a healthy diet  Eat a diet that includes plenty of vegetables, fruits, low-fat dairy products, and lean protein. Do not eat a lot of foods that are high in solid fats, added sugars, or sodium. Get regular exercise Get regular exercise. This is one of the most important things you can do for your health. Most adults should: Try to exercise for at least 150 minutes each week. The exercise should increase your heart rate and make you sweat (moderate-intensity exercise). Try to do strengthening exercises at least twice each week. Do these in addition to the moderate-intensity exercise. Spend less time sitting. Even light physical activity can be beneficial. Other tips Work with your health care provider to achieve or maintain a healthy weight. Do not use any products that contain nicotine or tobacco. These products include cigarettes, chewing tobacco, and vaping devices, such as e-cigarettes. If you need help quitting, ask your health care provider. Know your numbers. Ask your health care provider to check your cholesterol and your blood sugar (glucose). Continue to have your blood tested as directed by your health care provider. Do I need screening for cancer? Depending on your health history and family history, you may need to have cancer screenings at different stages of your life. This may include screening for: Breast cancer. Cervical cancer. Lung cancer. Colorectal cancer. What is my risk for osteoporosis? After menopause, you may be  at increased risk for osteoporosis. Osteoporosis is a condition in which bone destruction happens more quickly than new bone creation. To help prevent osteoporosis or  the bone fractures that can happen because of osteoporosis, you may take the following actions: If you are 61-3 years old, get at least 1,000 mg of calcium and at least 600 international units (IU) of vitamin D per day. If you are older than age 61 but younger than age 75, get at least 1,200 mg of calcium and at least 600 international units (IU) of vitamin D per day. If you are older than age 62, get at least 1,200 mg of calcium and at least 800 international units (IU) of vitamin D per day. Smoking and drinking excessive alcohol increase the risk of osteoporosis. Eat foods that are rich in calcium and vitamin D, and do weight-bearing exercises several times each week as directed by your health care provider. How does menopause affect my mental health? Depression may occur at any age, but it is more common as you become older. Common symptoms of depression include: Feeling depressed. Changes in sleep patterns. Changes in appetite or eating patterns. Feeling an overall lack of motivation or enjoyment of activities that you previously enjoyed. Frequent crying spells. Talk with your health care provider if you think that you are experiencing any of these symptoms. General instructions See your health care provider for regular wellness exams and vaccines. This may include: Scheduling regular health, dental, and eye exams. Getting and maintaining your vaccines. These include: Influenza vaccine. Get this vaccine each year before the flu season begins. Pneumonia vaccine. Shingles vaccine. Tetanus, diphtheria, and pertussis (Tdap) booster vaccine. Your health care provider may also recommend other immunizations. Tell your health care provider if you have ever been abused or do not feel safe at home. Summary Menopause is a normal process in which your ability to get pregnant comes to an end. This condition causes hot flashes, night sweats, decreased interest in sex, mood swings, headaches, or lack  of sleep. Treatment for this condition may include hormone replacement therapy. Take actions to keep yourself healthy, including exercising regularly, eating a healthy diet, watching your weight, and checking your blood pressure and blood sugar levels. Get screened for cancer and depression. Make sure that you are up to date with all your vaccines. This information is not intended to replace advice given to you by your health care provider. Make sure you discuss any questions you have with your health care provider. Document Revised: 03/04/2021 Document Reviewed: 03/04/2021 Elsevier Patient Education  2024 [employer].

## 2024-01-05 NOTE — Progress Notes (Signed)
 Subjective:    Patient ID: Jennifer Hubbard, female    DOB: 1961-06-03, 63 y.o.   MRN: [id number]  HPI  Patient presents to clinic today for her annual exam.    Flu: 06/2023 Tetanus: 09/2013 COVID: Pfizer x 2 Zostavax: 12/2015 Shingrix: 04/2017 Pneumovax: 10/2012 Prevnar 20: 09/2021 Pap smear: 11/2021 Mammogram: 08/2023 Bone density: 09/2023 Colon screening: 11/2020, Cologuard Vision screening: annually Dentist: biannually  Diet: She does eat meat. She consumes fruits and veggies. She tries to avoid fried foods. She drinks mostly water with lemon, hot tea. Exercise: Walking  Review of Systems     Past Medical History:  Diagnosis Date   Allergy    Anemia    Asthma    Bronchiectasis (HCC)    Cervical dystonia    Depression    IBS (irritable bowel syndrome) 09/10/2020   Sleep apnea     Current Outpatient Medications  Medication Sig Dispense Refill   albuterol (VENTOLIN HFA) 108 (90 Base) MCG/ACT inhaler Inhale 1 puff into the lungs every 6 (six) hours as needed for wheezing or shortness of breath. Inhale into the lungs every 6 (six) hours as needed for wheezing or shortness of breath. 18 g 3   busPIRone (BUSPAR) 10 MG tablet Take 1 tablet (10 mg total) by mouth 3 (three) times daily. 270 tablet 2   Cholecalciferol (VITAMIN D) 125 MCG (5000 UT) CAPS Take by mouth.     clobetasol cream (TEMOVATE) 0.05 % APPLY TO AFFECTED AREA(s) TWICE DAILY 60 g 0   cycloSPORINE (RESTASIS) 0.05 % ophthalmic emulsion Place 1 drop into both eyes 2 (two) times daily. Administer 1 drop to both eyes Two (2) times a day. 0.4 mL 2   diazepam (VALIUM) 5 MG tablet Take 1 tablet p.o.1 hour prior to procedures 2 tablet 0   estradiol-norethindrone (ACTIVELLA) 1-0.5 MG tablet Take 1 tablet by mouth daily. 84 tablet 1   fexofenadine (ALLEGRA ALLERGY) 180 MG tablet Take 1 tablet (180 mg total) by mouth daily. 90 tablet 1   fluticasone (VERAMYST) 27.5 MCG/SPRAY nasal spray Place 2 sprays into the nose daily. 10  g 3   montelukast (SINGULAIR) 10 MG tablet Take 1 tablet (10 mg total) by mouth at bedtime. 90 tablet 1   Multiple Vitamin (MULTIVITAMIN) capsule Take 1 capsule by mouth daily.     Omega-3 Fatty Acids (FISH OIL) 1000 MG CAPS Take by mouth.     sertraline (ZOLOFT) 100 MG tablet Take 1 tablet (100 mg total) by mouth daily. 90 tablet 1   No current facility-administered medications for this visit.    Allergies  Allergen Reactions   Cephalexin Other (See Comments)    Other reaction(s): Abdominal Pain Other reaction(s): Abdominal Pain    Erythromycin     nausea   Latex Other (See Comments)    Unsure if allergic   Penicillins     "childhood allergy"   Sulfamethoxazole-Trimethoprim Rash    Family History  Problem Relation Age of Onset   Atrial fibrillation Mother    Heart disease Mother    Lung cancer Father    Stroke Maternal Grandmother    Breast cancer Paternal Grandmother    Hypertension Paternal Grandmother    Breast cancer Maternal Aunt     Social History   Socioeconomic History   Marital status: Married    Spouse name: Not on file   Number of children: Not on file   Years of education: Not on file   Highest education level: Not on  file  Occupational History   Not on file  Tobacco Use   Smoking status: Never   Smokeless tobacco: Never  Vaping Use   Vaping status: Never Used  Substance and Sexual Activity   Alcohol use: Never   Drug use: Never   Sexual activity: Yes    Birth control/protection: None    Comment: Married  Other Topics Concern   Not on file  Social History Narrative   Not on file   Social Drivers of Health   Financial Resource Strain: Low Risk  (12/01/2023)   Received from [facility]   Overall Financial Resource Strain (CARDIA)    Difficulty of Paying Living Expenses: Not hard at all  Food Insecurity: No Food Insecurity (12/01/2023)   Received [facility]   Hunger Vital Sign    Worried About  Running Out of Food in the Last Year: Never true    Ran Out of Food in the Last Year: Never true  Transportation Needs: No Transportation Needs (12/01/2023)   Received from [facility] - Transportation    In the past 12 months, has lack of transportation kept you from medical appointments or from getting medications?: No    Lack of Transportation (Non-Medical): No  Physical Activity: Not on file  Stress: Not on file  Social Connections: Not on file  Intimate Partner Violence: Not on file     Constitutional: Denies fever, malaise, fatigue, headache or abrupt weight changes.  HEENT: Denies eye pain, eye redness, ear pain, ringing in the ears, wax buildup, runny nose, nasal congestion, bloody nose, or sore throat. Respiratory: Denies difficulty breathing, shortness of breath, cough or sputum production.   Cardiovascular: Denies chest pain, chest tightness, palpitations or swelling in the hands or feet.  Gastrointestinal: Pt reports intermittent constipation. Denies abdominal pain, bloating, diarrhea or blood in the stool.  GU: Denies urgency, frequency, pain with urination, burning sensation, blood in urine, odor or discharge. Musculoskeletal:  Denies decrease in range of motion, difficulty with gait, and or joint pain or swelling.  Skin: Denies redness, rashes, lesions or ulcercations.  Neurological: Pt reports difficulty with memory. Denies dizziness, difficulty with speech or problems with balance and coordination.  Psych: Patient has a history of anxiety and depression.  Denies SI/HI.  No other specific complaints in a complete review of systems (except as listed in HPI above).  Objective:   Physical Exam BP 102/68 (BP Location: Right Arm, Patient Position: Sitting, Cuff Size: Normal)   Ht 5\' 6"  (1.676 m)   Wt 153 lb 3.2 oz (69.5 kg)   BMI 24.73 kg/m    Wt Readings from Last 3 Encounters:  08/27/23 145 lb (65.8 kg)  03/30/23 140 lb (63.5 kg)  03/20/23  141 lb (64 kg)    General: Appears her stated age, well developed, well nourished in NAD. Skin: Warm, dry and intact.  HEENT: Head: normal shape and size; Eyes: sclera white, no icterus, conjunctiva pink, PERRLA and EOMs intact;  Neck:  Neck supple, trachea midline. No masses, lumps or thyromegaly present.  Cardiovascular: Normal rate and rhythm. S1,S2 noted.  No murmur, rubs or gallops noted. No JVD or BLE edema. No carotid bruits noted. Pulmonary/Chest: Normal effort and positive vesicular breath sounds. No respiratory distress. No wheezes, rales or ronchi noted.  Abdomen: Normal bowel sounds.  Musculoskeletal: Strength 5/5 BUE/BLE. No difficulty with gait.  Neurological: Alert and oriented. Cranial nerves II-XII grossly intact. Coordination normal.  Psychiatric: Mood and affect normal. Behavior is normal. Judgment and thought content normal.     BMET    Component Value Date/Time   NA 140 01/22/2023 1556   NA 142 09/12/2020 1549   K 4.4 01/22/2023 1556   CL 109 01/22/2023 1556   CO2 25 01/22/2023 1556   GLUCOSE 89 01/22/2023 1556   BUN 12 01/22/2023 1556   BUN 8 09/12/2020 1549   CREATININE 0.90 01/22/2023 1556   CALCIUM 9.1 01/22/2023 1556   GFRNONAA 74 11/20/2020 0959   GFRAA 86 11/20/2020 0959    Lipid Panel     Component Value Date/Time   CHOL 155 01/22/2023 1556   TRIG 88 01/22/2023 1556   HDL 49 (L) 01/22/2023 1556   CHOLHDL 3.2 01/22/2023 1556   LDLCALC 88 01/22/2023 1556    CBC    Component Value Date/Time   WBC 4.9 01/22/2023 1556   RBC 4.19 01/22/2023 1556   HGB 12.1 01/22/2023 1556   HGB 13.1 09/12/2020 1549   HCT 36.8 01/22/2023 1556   HCT 39.2 09/12/2020 1549   PLT 201 01/22/2023 1556   PLT 218 09/12/2020 1549   MCV 87.8 01/22/2023 1556   MCV 89 09/12/2020 1549   MCH 28.9 01/22/2023 1556   MCHC 32.9 01/22/2023 1556   RDW 13.4 01/22/2023 1556   RDW 13.2 09/12/2020 1549   LYMPHSABS 1,080 08/08/2021 1327   EOSABS 12 (L) 08/08/2021 1327    BASOSABS 18 08/08/2021 1327    Hgb A1C No results found for: "HGBA1C"         Assessment & Plan:   Preventative Health Maintenance:  Flu shot UTD Will get tetanus at her next visit Encouraged her to get her COVID booster Pneumovax and Prevnar UTD Zostavax UTD Encouraged her to get her second Shingrix vaccine Pap smear UTD Mammogram UTD Bone density UTD  Cologuard ordered Encouraged her to consume a balanced diet and exercise regimen Advised her to see an eye doctor and dentist annually Will check CBC, CMET, lipid and A1C today  Memory Impairment:  She is requesting referral to [facility], will reach out to MyChart to get clarification about where she is wanting to be seen so that I can place the referral  RTC in 6 months, follow-up chronic conditions Nicki Reaper, NP

## 2024-01-06 ENCOUNTER — Encounter: Payer: Self-pay | Admitting: Internal Medicine

## 2024-01-06 LAB — CBC
HCT: 37.5 % (ref 35.0–45.0)
Hemoglobin: 11.9 g/dL (ref 11.7–15.5)
MCH: 27.2 pg (ref 27.0–33.0)
MCHC: 31.7 g/dL — ABNORMAL LOW (ref 32.0–36.0)
MCV: 85.6 fL (ref 80.0–100.0)
MPV: 11 fL (ref 7.5–12.5)
Platelets: 249 10*3/uL (ref 140–400)
RBC: 4.38 10*6/uL (ref 3.80–5.10)
RDW: 14 % (ref 11.0–15.0)
WBC: 4.2 10*3/uL (ref 3.8–10.8)

## 2024-01-06 LAB — HEMOGLOBIN A1C
Hgb A1c MFr Bld: 5.2 %{Hb} (ref <5.7)
Mean Plasma Glucose: 103 mg/dL
eAG (mmol/L): 5.7 mmol/L

## 2024-01-06 LAB — IRON,TIBC AND FERRITIN PANEL
%SAT: 20 % (ref 16–45)
Ferritin: 10 ng/mL — ABNORMAL LOW (ref 16–232)
Iron: 93 ug/dL (ref 45–160)
TIBC: 464 ug/dL — ABNORMAL HIGH (ref 250–450)

## 2024-01-06 LAB — LIPID PANEL
Cholesterol: 206 mg/dL — ABNORMAL HIGH (ref <200)
HDL: 70 mg/dL (ref >=50)
LDL Cholesterol (Calc): 122 mg/dL — ABNORMAL HIGH
Non-HDL Cholesterol (Calc): 136 mg/dL — ABNORMAL HIGH (ref <130)
Total CHOL/HDL Ratio: 2.9 (calc) (ref <5.0)
Triglycerides: 53 mg/dL (ref <150)

## 2024-01-06 LAB — COMPLETE METABOLIC PANEL WITH GFR
AG Ratio: 1.6 (calc) (ref 1.0–2.5)
ALT: 22 U/L (ref 6–29)
AST: 26 U/L (ref 10–35)
Albumin: 4.4 g/dL (ref 3.6–5.1)
Alkaline phosphatase (APISO): 59 U/L (ref 37–153)
BUN: 19 mg/dL (ref 7–25)
CO2: 25 mmol/L (ref 20–32)
Calcium: 9.9 mg/dL (ref 8.6–10.4)
Chloride: 108 mmol/L (ref 98–110)
Creat: 0.9 mg/dL (ref 0.50–1.05)
Globulin: 2.8 g/dL (ref 1.9–3.7)
Glucose, Bld: 81 mg/dL (ref 65–99)
Potassium: 4.3 mmol/L (ref 3.5–5.3)
Sodium: 141 mmol/L (ref 135–146)
Total Bilirubin: 0.5 mg/dL (ref 0.2–1.2)
Total Protein: 7.2 g/dL (ref 6.1–8.1)
eGFR: 73 mL/min/{1.73_m2} (ref >=60)

## 2024-01-06 LAB — VITAMIN D 25 HYDROXY (VIT D DEFICIENCY, FRACTURES): Vit D, 25-Hydroxy: 43 ng/mL (ref 30–100)

## 2024-01-18 ENCOUNTER — Telehealth: Payer: Self-pay

## 2024-01-18 NOTE — Telephone Encounter (Signed)
 Copied from CRM [id number]. Topic: Clinical - Medical Advice >> Jan 18, 2024  4:12 PM Alessandra Bevels wrote: Reason for CRM: Patient is calling to report that she was supposed to receive a tetanus vaccine when she was last in the office and forgot. Does she need a nurse visit or can she just walk in for the tetanus. Please advise

## 2024-01-18 NOTE — Telephone Encounter (Signed)
 Scheduled tomorrow

## 2024-01-19 ENCOUNTER — Ambulatory Visit (INDEPENDENT_AMBULATORY_CARE_PROVIDER_SITE_OTHER)

## 2024-01-19 DIAGNOSIS — Z23 Encounter for immunization: Secondary | ICD-10-CM | POA: Diagnosis not present

## 2024-01-21 LAB — COLOGUARD: COLOGUARD: NEGATIVE

## 2024-01-26 ENCOUNTER — Encounter: Payer: Self-pay | Admitting: Internal Medicine

## 2024-02-04 ENCOUNTER — Other Ambulatory Visit: Payer: Self-pay | Admitting: Internal Medicine

## 2024-02-04 NOTE — Telephone Encounter (Signed)
 Last OV 01/05/24.  Requested Prescriptions  Pending Prescriptions Disp Refills   sertraline (ZOLOFT) 100 MG tablet [Pharmacy Med Name: SERTRALINE HCL 100 MG TABLET] 90 tablet 1    Sig: TAKE 1 TABLET BY MOUTH EVERY DAY     Psychiatry:  Antidepressants - SSRI - sertraline Failed - 02/04/2024  2:36 PM      Failed - Valid encounter within last 6 months    Recent Outpatient Visits           1 month ago Encounter for general adult medical examination with abnormal findings   [facility] [place], [place] W, NP              Passed - AST in normal range and within 360 days    AST  Date Value Ref Range Status  01/05/2024 26 10 - 35 U/L Final         Passed - ALT in normal range and within 360 days    ALT  Date Value Ref Range Status  01/05/2024 22 6 - 29 U/L Final         Passed - Completed PHQ-2 or PHQ-9 in the last 360 days

## 2024-02-15 ENCOUNTER — Encounter: Payer: Self-pay | Admitting: Certified Nurse Midwife

## 2024-02-15 ENCOUNTER — Ambulatory Visit (INDEPENDENT_AMBULATORY_CARE_PROVIDER_SITE_OTHER): Admitting: Certified Nurse Midwife

## 2024-02-15 ENCOUNTER — Other Ambulatory Visit (HOSPITAL_COMMUNITY)
Admission: RE | Admit: 2024-02-15 | Discharge: 2024-02-15 | Disposition: A | Discharge status: Home / Self Care | Source: Ambulatory Visit | Attending: Certified Nurse Midwife | Admitting: Certified Nurse Midwife

## 2024-02-15 ENCOUNTER — Other Ambulatory Visit: Payer: Self-pay | Admitting: Certified Nurse Midwife

## 2024-02-15 VITALS — BP 110/74 | HR 90 | Ht 66.0 in | Wt 155.9 lb

## 2024-02-15 DIAGNOSIS — Z124 Encounter for screening for malignant neoplasm of cervix: Secondary | ICD-10-CM | POA: Diagnosis present

## 2024-02-15 DIAGNOSIS — Z01419 Encounter for gynecological examination (general) (routine) without abnormal findings: Secondary | ICD-10-CM | POA: Diagnosis not present

## 2024-02-15 DIAGNOSIS — Z853 Personal history of malignant neoplasm of breast: Secondary | ICD-10-CM

## 2024-02-15 DIAGNOSIS — N949 Unspecified condition associated with female genital organs and menstrual cycle: Secondary | ICD-10-CM

## 2024-02-15 DIAGNOSIS — Z86018 Personal history of other benign neoplasm: Secondary | ICD-10-CM

## 2024-02-15 MED ORDER — INTRAROSA 6.5 MG VA INST
6.5000 mg | VAGINAL_INSERT | Freq: Every day | VAGINAL | 3 refills | Status: DC
Start: 2024-02-15 — End: 2024-02-24

## 2024-02-15 MED ORDER — TESTOSTERONE 1.62 % TD GEL: 1.0000 | TRANSDERMAL | 0 refills | Status: DC

## 2024-02-15 NOTE — Patient Instructions (Signed)
 Preventive Care 16-61 Years Old, Female  Preventive care refers to lifestyle choices and visits with your health care provider that can promote health and wellness. Preventive care visits are also called wellness exams.  What can I expect for my preventive care visit?  Counseling  Your health care provider may ask you questions about your:  Medical history, including:  Past medical problems.  Family medical history.  Pregnancy history.  Current health, including:  Menstrual cycle.  Method of birth control.  Emotional well-being.  Home life and relationship well-being.  Sexual activity and sexual health.  Lifestyle, including:  Alcohol, nicotine or tobacco, and drug use.  Access to firearms.  Diet, exercise, and sleep habits.  Work and work Astronomer.  Sunscreen use.  Safety issues such as seatbelt and bike helmet use.  Physical exam  Your health care provider will check your:  Height and weight. These may be used to calculate your BMI (body mass index). BMI is a measurement that tells if you are at a healthy weight.  Waist circumference. This measures the distance around your waistline. This measurement also tells if you are at a healthy weight and may help predict your risk of certain diseases, such as type 2 diabetes and high blood pressure.  Heart rate and blood pressure.  Body temperature.  Skin for abnormal spots.  What immunizations do I need?    Vaccines are usually given at various ages, according to a schedule. Your health care provider will recommend vaccines for you based on your age, medical history, and lifestyle or other factors, such as travel or where you work.  What tests do I need?  Screening  Your health care provider may recommend screening tests for certain conditions. This may include:  Lipid and cholesterol levels.  Diabetes screening. This is done by checking your blood sugar (glucose) after you have not eaten for a while (fasting).  Pelvic exam and Pap test.  Hepatitis B test.  Hepatitis C  test.  HIV (human immunodeficiency virus) test.  STI (sexually transmitted infection) testing, if you are at risk.  Lung cancer screening.  Colorectal cancer screening.  Mammogram. Talk with your health care provider about when you should start having regular mammograms. This may depend on whether you have a family history of breast cancer.  BRCA-related cancer screening. This may be done if you have a family history of breast, ovarian, tubal, or peritoneal cancers.  Bone density scan. This is done to screen for osteoporosis.  Talk with your health care provider about your test results, treatment options, and if necessary, the need for more tests.  Follow these instructions at home:  Eating and drinking    Eat a diet that includes fresh fruits and vegetables, whole grains, lean protein, and low-fat dairy products.  Take vitamin and mineral supplements as recommended by your health care provider.  Do not drink alcohol if:  Your health care provider tells you not to drink.  You are pregnant, may be pregnant, or are planning to become pregnant.  If you drink alcohol:  Limit how much you have to 0-1 drink a day.  Know how much alcohol is in your drink. In the [place] one drink equals one 12 oz bottle of beer (355 mL), one 5 oz glass of wine (148 mL), or one 1 oz glass of hard liquor (44 mL).  Lifestyle  Brush your teeth every morning and night with fluoride toothpaste. Floss one time each day.  Exercise for at least  30 minutes 5 or more days each week.  Do not use any products that contain nicotine or tobacco. These products include cigarettes, chewing tobacco, and vaping devices, such as e-cigarettes. If you need help quitting, ask your health care provider.  Do not use drugs.  If you are sexually active, practice safe sex. Use a condom or other form of protection to prevent STIs.  If you do not wish to become pregnant, use a form of birth control. If you plan to become pregnant, see your health care provider for a  prepregnancy visit.  Take aspirin only as told by your health care provider. Make sure that you understand how much to take and what form to take. Work with your health care provider to find out whether it is safe and beneficial for you to take aspirin daily.  Find healthy ways to manage stress, such as:  Meditation, yoga, or listening to music.  Journaling.  Talking to a trusted person.  Spending time with friends and family.  Minimize exposure to UV radiation to reduce your risk of skin cancer.  Safety  Always wear your seat belt while driving or riding in a vehicle.  Do not drive:  If you have been drinking alcohol. Do not ride with someone who has been drinking.  When you are tired or distracted.  While texting.  If you have been using any mind-altering substances or drugs.  Wear a helmet and other protective equipment during sports activities.  If you have firearms in your house, make sure you follow all gun safety procedures.  Seek help if you have been physically or sexually abused.  What's next?  Visit your health care provider once a year for an annual wellness visit.  Ask your health care provider how often you should have your eyes and teeth checked.  Stay up to date on all vaccines.  This information is not intended to replace advice given to you by your health care provider. Make sure you discuss any questions you have with your health care provider.  Document Revised: 04/10/2021 Document Reviewed: 04/10/2021  Elsevier Patient Education  2024 [employer].

## 2024-02-15 NOTE — Progress Notes (Signed)
 GYNECOLOGY ANNUAL PREVENTATIVE CARE ENCOUNTER NOTE  History:     Jennifer Hubbard is a 63 y.o. G0P0000 female here for a routine annual gynecologic exam.  Current complaints: patient states that she has had to come off hormone replacement therapy due to breast cancer and experiencing low libido and vaginal dryness and would like to discuss medication options, patient also has concerns of cancer risk since she had breast cancer she wanted to know risk of developing uterine cancer .   Denies abnormal vaginal bleeding, discharge, pelvic pain, problems with intercourse or other gynecologic concerns.     Social Relationship:married Living: with husband and two children ( ages 13& 53) Work:full time Exercise:2x a week Smoke/Alcohol/drug use:no history of tobacco use/ non drinker/ no history of drug use  Gynecologic History No LMP recorded. Patient has had an ablation. Contraception: none Last Pap: 12/05/2021. Results were: normal with negative HPV Last mammogram: 07/18/2023. Results were: normal  Obstetric History OB History  Gravida Para Term Preterm AB Living  0 0 0 0 0 0  SAB IAB Ectopic Multiple Live Births  0 0 0 0 0    Past Medical History:  Diagnosis Date   Allergy    Anemia    Asthma    Bronchiectasis (HCC)    Cervical dystonia    Depression    IBS (irritable bowel syndrome) 09/10/2020   Sleep apnea     Past Surgical History:  Procedure Laterality Date   BREAST CYST ASPIRATION Left    neg   ENDOSCOPIC CONCHA BULLOSA RESECTION Right 06/07/2020   Procedure: ENDOSCOPIC CONCHA BULLOSA RESECTION;  Surgeon: Mellody Sprout, MD;  Location: [facility];  Service: ENT;  Laterality: Right;   ESOPHAGOGASTRODUODENOSCOPY (EGD) WITH PROPOFOL  N/A 06/06/2021   Procedure: ESOPHAGOGASTRODUODENOSCOPY (EGD) WITH PROPOFOL ;  Surgeon: Irby Mannan, MD;  Location: [facility];  Service: Endoscopy;  Laterality: N/A;   EYE SURGERY     GASTRIC BYPASS     LAPAROSCOPIC  OOPHERECTOMY     NASAL SEPTOPLASTY W/ TURBINOPLASTY Bilateral 06/07/2020   Procedure: NASAL SEPTOPLASTY WITH INFERIOR TURBINATE REDUCTION;  Surgeon: Mellody Sprout, MD;  Location: [facility];  Service: ENT;  Laterality: Bilateral;    Current Outpatient Medications on File Prior to Visit  Medication Sig Dispense Refill   albuterol  (VENTOLIN  HFA) 108 (90 Base) MCG/ACT inhaler Inhale 1 puff into the lungs every 6 (six) hours as needed for wheezing or shortness of breath. Inhale into the lungs every 6 (six) hours as needed for wheezing or shortness of breath. 18 g 3   busPIRone  (BUSPAR ) 10 MG tablet Take 1 tablet (10 mg total) by mouth 3 (three) times daily. 270 tablet 2   cycloSPORINE  (RESTASIS ) 0.05 % ophthalmic emulsion Place 1 drop into both eyes 2 (two) times daily. Administer 1 drop to both eyes Two (2) times a day. 0.4 mL 2   fexofenadine  (ALLEGRA  ALLERGY) 180 MG tablet Take 1 tablet (180 mg total) by mouth daily. 90 tablet 1   fluticasone  (VERAMYST) 27.5 MCG/SPRAY nasal spray Place 2 sprays into the nose daily. 10 g 3   montelukast  (SINGULAIR ) 10 MG tablet Take 1 tablet (10 mg total) by mouth at bedtime. 90 tablet 1   Multiple Vitamin (MULTIVITAMIN) capsule Take 1 capsule by mouth daily.     Omega-3 Fatty Acids (FISH OIL) 1000 MG CAPS Take by mouth.     sertraline  (ZOLOFT ) 100 MG tablet TAKE 1 TABLET BY MOUTH EVERY DAY 90 tablet 1  VEVYE  0.1 % SOLN      budesonide -formoterol  (SYMBICORT ) 80-4.5 MCG/ACT inhaler Inhale 2 puffs into the lungs.     No current facility-administered medications on file prior to visit.    Allergies  Allergen Reactions   Cephalexin Other (See Comments)    Other reaction(s): Abdominal Pain Other reaction(s): Abdominal Pain    Erythromycin     nausea   Latex Other (See Comments)    Unsure if allergic   Penicillins     "childhood allergy"   Sulfamethoxazole-Trimethoprim Rash    Social History:  reports that she has never smoked. She has never  used smokeless tobacco. She reports that she does not drink alcohol and does not use drugs.  Family History  Problem Relation Age of Onset   Atrial fibrillation Mother    Heart disease Mother    Lung cancer Father    Stroke Maternal Grandmother    Breast cancer Paternal Grandmother    Hypertension Paternal Grandmother    Breast cancer Maternal Aunt     The following portions of the patient's history were reviewed and updated as appropriate: allergies, current medications, past family history, past medical history, past social history, past surgical history and problem list.  Review of Systems Pertinent items noted in HPI and remainder of comprehensive ROS otherwise negative.  Physical Exam:  BP 110/74   Pulse 90   Ht 5\' 6"  (1.676 m)   Wt 155 lb 14.4 oz (70.7 kg)   BMI 25.16 kg/m  CONSTITUTIONAL: Well-developed, well-nourished female in no acute distress.  HENT:  Normocephalic, atraumatic, External right and left ear normal. Oropharynx is clear and moist EYES: Conjunctivae and EOM are normal. Pupils are equal, round, and reactive to light. No scleral icterus.  NECK: Normal range of motion, supple, no masses.  Normal thyroid .  SKIN: Skin is warm and dry. No rash noted. Not diaphoretic. No erythema. No pallor. MUSCULOSKELETAL: Normal range of motion. No tenderness.  No cyanosis, clubbing, or edema.  2+ distal pulses. NEUROLOGIC: Alert and oriented to person, place, and time. Normal reflexes, muscle tone coordination.  PSYCHIATRIC: Normal mood and affect. Normal behavior. Normal judgment and thought content. CARDIOVASCULAR: Normal heart rate noted, regular rhythm RESPIRATORY: Clear to auscultation bilaterally. Effort and breath sounds normal, no problems with respiration noted. BREASTS: Symmetric in size. No masses, tenderness, skin changes, nipple drainage, or lymphadenopathy bilaterally.  ABDOMEN: Soft, no distention noted.  No tenderness, rebound or guarding.  PELVIC: Normal  appearing external genitalia and urethral meatus; normal appearing vaginal mucosa and cervix. Cervical motion tenderness present. No abnormal discharge noted.  Pap smear obtained.  Normal uterine size, no other palpable masses, no uterine or adnexal tenderness.  .   Assessment and Plan:    Annual [facility]   Pap: collected per pt request Mammogram : n/a pt has had double mastectomy  Labs: none Refills:intrarosa , testosterone ( pt is to check with oncologist prior to taking) Referral:none Routine preventative health maintenance measures emphasized. Please refer to After Visit Summary for other counseling recommendations.     Dr. Everardo Hitch consulted on medication management.  Alise Appl, CNM [facility]  [facility],  [facility]

## 2024-02-17 ENCOUNTER — Encounter: Payer: Self-pay | Admitting: Certified Nurse Midwife

## 2024-02-17 ENCOUNTER — Encounter: Payer: Self-pay | Admitting: Internal Medicine

## 2024-02-17 ENCOUNTER — Ambulatory Visit: Payer: Self-pay

## 2024-02-17 ENCOUNTER — Other Ambulatory Visit: Payer: Self-pay | Admitting: Internal Medicine

## 2024-02-17 ENCOUNTER — Ambulatory Visit (INDEPENDENT_AMBULATORY_CARE_PROVIDER_SITE_OTHER): Admitting: Internal Medicine

## 2024-02-17 VITALS — BP 102/62 | HR 76 | Ht 66.0 in | Wt 153.2 lb

## 2024-02-17 DIAGNOSIS — J479 Bronchiectasis, uncomplicated: Secondary | ICD-10-CM

## 2024-02-17 DIAGNOSIS — J301 Allergic rhinitis due to pollen: Secondary | ICD-10-CM

## 2024-02-17 LAB — CERVICOVAGINAL ANCILLARY ONLY
Bacterial Vaginitis (gardnerella): NEGATIVE
Candida Glabrata: NEGATIVE
Candida Vaginitis: NEGATIVE
Chlamydia: NEGATIVE
Comment: NEGATIVE
Comment: NEGATIVE
Comment: NEGATIVE
Comment: NEGATIVE
Comment: NEGATIVE
Comment: NORMAL
Neisseria Gonorrhea: NEGATIVE
Trichomonas: NEGATIVE

## 2024-02-17 MED ORDER — PREDNISONE 10 MG PO TABS: ORAL_TABLET | ORAL | 0 refills | Status: DC

## 2024-02-17 MED ORDER — ALBUTEROL SULFATE HFA 108 (90 BASE) MCG/ACT IN AERS
1.0000 | INHALATION_SPRAY | Freq: Four times a day (QID) | RESPIRATORY_TRACT | 3 refills | Status: AC | PRN
Start: 2024-02-17 — End: ?

## 2024-02-17 MED ORDER — BUDESONIDE-FORMOTEROL FUMARATE 80-4.5 MCG/ACT IN AERO: 2.0000 | INHALATION_SPRAY | Freq: Every day | RESPIRATORY_TRACT | 0 refills | Status: DC

## 2024-02-17 MED ORDER — AZITHROMYCIN 250 MG PO TABS: ORAL_TABLET | ORAL | 0 refills | Status: DC

## 2024-02-17 NOTE — Patient Instructions (Signed)
 Allergic Rhinitis, Adult  Allergic rhinitis is a reaction to allergens. Allergens are things that can cause an allergic reaction. This condition affects the lining inside the nose (mucous membrane). There are two types of allergic rhinitis: Seasonal. This type is also called hay fever. It happens only during some times of the year. Perennial. This type can happen at any time of the year. This condition cannot be spread from person to person (is not contagious). It can be mild, bad, or very bad. It can develop at any age and may be outgrown. What are the causes? Pollen from grasses, trees, and weeds. Other causes can be: Dust mites. Smoke. Mold. Car fumes. The pee (urine), spit, or dander of pets. Dander is dead skin cells from a pet. What increases the risk? You are more likely to develop this condition if: You have allergies in your family. You have problems like allergies in your family. You may have: Swelling of parts of your eyes and eyelids. Asthma. This affects how you breathe. Long-term redness and swelling on your skin. Food allergies. What are the signs or symptoms? The main symptom of this condition is a runny or stuffy nose (nasal congestion). Other symptoms may include: Sneezing or coughing. Itching and tearing of your eyes. Mucus that drips down the back of your throat (postnasal drip). This may cause a sore throat. Trouble sleeping. Feeling tired. Headache. How is this treated? There is no cure for this condition. You should avoid things that you are allergic to. Treatment can help to relieve symptoms. This may include: Medicines that block allergy symptoms, such as anti-inflammatories or antihistamines. These may be given as a shot, nasal spray, or pill. Avoiding things you are allergic to. Medicines that give you some of what you are allergic to over time. This is called immunotherapy. It is done if other treatments do not help. You may get: Shots. Medicine under  your tongue. Stronger medicines, if other treatments do not help. Follow these instructions at home: Avoiding allergens Find out what things you are allergic to and avoid them. To do this, try these things: If you get allergies any time of year: Replace carpet with wood, tile, or vinyl flooring. Carpet can trap pet dander and dust. Do not smoke. Do not allow smoking in your home. Change your heating and air conditioning filters at least once a month. If you get allergies only some times of the year: Keep windows closed when you can. Plan things to do outside when pollen counts are lowest. Check pollen counts before you plan things to do outside. When you come indoors, change your clothes and shower before you sit on furniture or bedding. If you are allergic to a pet: Keep the pet out of your bedroom. Vacuum, sweep, and dust often. General instructions Take over-the-counter and prescription medicines only as told by your doctor. Drink enough fluid to keep your pee pale yellow. Where to find more information American Academy of Allergy, Asthma & Immunology: aaaai.org Contact a doctor if: You have a fever. You get a cough that does not go away. You make high-pitched whistling sounds when you breathe, most often when you breathe out (wheeze). Your symptoms slow you down. Your symptoms stop you from doing your normal things each day. Get help right away if: You are short of breath. This symptom may be an emergency. Get help right away. Call 911. Do not wait to see if the symptom will go away. Do not drive yourself to the  hospital. This information is not intended to replace advice given to you by your health care provider. Make sure you discuss any questions you have with your health care provider. Document Revised: 06/23/2022 Document Reviewed: 06/23/2022 Elsevier Patient Education  2024 [employer].

## 2024-02-17 NOTE — Progress Notes (Signed)
 Subjective:    Patient ID: Jennifer Hubbard, female    DOB: [birth year], 63 y.o.   MRN: [id number]  HPI  Discussed the use of AI scribe software for clinical note transcription with the patient, who gave verbal consent to proceed.  Jennifer Hubbard is a 63 year old female with bronchiectasis who presents with nasal congestion, sore throat, cough, and airway tightness.  She has been experiencing nasal congestion, sore throat, cough, and tightness of the airway for the past couple of days, with wheezing noted last night. No significant nasal discharge, headache, sinus pressure, ear pain, nausea, or vomiting. Shortness of breath is associated with the tightness in her throat.  She has been taking Mucinex twice a day, although she usually takes it only at night. She has also been performing nasal lavages. She has not used her Symbicort  inhaler recently as she felt she did not need it, and is unsure if she has an up-to-date prescription. She has been using her albuterol  rescue inhaler several times, especially last night.  She is concerned that her symptoms might be affecting her bronchiectasis and mentions the need to recover before returning to work.      Review of Systems   Past Medical History:  Diagnosis Date   Allergy    Anemia    Asthma    Bronchiectasis (HCC)    Cervical dystonia    Depression    IBS (irritable bowel syndrome) 09/10/2020   Sleep apnea     Current Outpatient Medications  Medication Sig Dispense Refill   albuterol  (VENTOLIN  HFA) 108 (90 Base) MCG/ACT inhaler Inhale 1 puff into the lungs every 6 (six) hours as needed for wheezing or shortness of breath. Inhale into the lungs every 6 (six) hours as needed for wheezing or shortness of breath. 18 g 3   budesonide -formoterol  (SYMBICORT ) 80-4.5 MCG/ACT inhaler Inhale 2 puffs into the lungs.     busPIRone  (BUSPAR ) 10 MG tablet Take 1 tablet (10 mg total) by mouth 3 (three) times daily. 270 tablet 2   cycloSPORINE  (RESTASIS )  0.05 % ophthalmic emulsion Place 1 drop into both eyes 2 (two) times daily. Administer 1 drop to both eyes Two (2) times a day. 0.4 mL 2   fexofenadine  (ALLEGRA  ALLERGY) 180 MG tablet Take 1 tablet (180 mg total) by mouth daily. 90 tablet 1   fluticasone  (VERAMYST) 27.5 MCG/SPRAY nasal spray Place 2 sprays into the nose daily. 10 g 3   montelukast  (SINGULAIR ) 10 MG tablet Take 1 tablet (10 mg total) by mouth at bedtime. 90 tablet 1   Multiple Vitamin (MULTIVITAMIN) capsule Take 1 capsule by mouth daily.     Omega-3 Fatty Acids (FISH OIL) 1000 MG CAPS Take by mouth.     Prasterone  (INTRAROSA ) 6.5 MG INST Place 6.5 mg vaginally at bedtime. 30 each 3   sertraline  (ZOLOFT ) 100 MG tablet TAKE 1 TABLET BY MOUTH EVERY DAY 90 tablet 1   Testosterone  1.62 % GEL Place 1 Application onto the skin 3 (three) times a week. 75 g 0   VEVYE  0.1 % SOLN      No current facility-administered medications for this visit.    Allergies  Allergen Reactions   Cephalexin Other (See Comments)    Other reaction(s): Abdominal Pain Other reaction(s): Abdominal Pain    Erythromycin     nausea   Latex Other (See Comments)    Unsure if allergic   Penicillins     "childhood allergy"   Sulfamethoxazole-Trimethoprim Rash    Family History  Problem Relation Age of Onset   Atrial fibrillation Mother    Heart disease Mother    Lung cancer Father    Stroke Maternal Grandmother    Breast cancer Paternal Grandmother    Hypertension Paternal Grandmother    Breast cancer Maternal Aunt     Social History   Socioeconomic History   Marital status: Married    Spouse name: Not on file   Number of children: Not on file   Years of education: Not on file   Highest education level: Not on file  Occupational History   Not on file  Tobacco Use   Smoking status: Never   Smokeless tobacco: Never  Vaping Use   Vaping status: Never Used  Substance and Sexual Activity   Alcohol use: Never   Drug use: Never   Sexual  activity: Yes    Birth control/protection: None    Comment: Married  Other Topics Concern   Not on file  Social History Narrative   Not on file   Social Drivers of Health   Financial Resource Strain: Low Risk  (12/01/2023)   Received from [facility]   Overall Financial Resource Strain (CARDIA)    Difficulty of Paying Living Expenses: Not hard at all  Food Insecurity: No Food Insecurity (12/01/2023)   Received from [facility]   Hunger Vital Sign    Worried About Running Out of Food in the Last Year: Never true    Ran Out of Food in the Last Year: Never true  Transportation Needs: No Transportation Needs (12/01/2023)   Received from [facility] - Transportation    In the past 12 months, has lack of transportation kept you from medical appointments or from getting medications?: No    Lack of Transportation (Non-Medical): No  Physical Activity: Not on file  Stress: Not on file  Social Connections: Not on file  Intimate Partner Violence: Not on file     Constitutional: Denies fever, malaise, fatigue, headache or abrupt weight changes.  HEENT: Patient reports  nasal congestion, sore throat.  Denies eye pain, eye redness, ear pain, ringing in the ears, wax buildup, runny nose, bloody nose. Respiratory: Patient reports cough and shortness of breath.  Denies difficulty breathing.   Cardiovascular: Denies chest pain, chest tightness, palpitations or swelling in the hands or feet.  Gastrointestinal: Denies abdominal pain, bloating, constipation, diarrhea or blood in the stool.   No other specific complaints in a complete review of systems (except as listed in HPI above).      Objective:   Physical Exam  BP 102/62 (BP Location: Right Arm, Patient Position: Sitting, Cuff Size: Normal)   Pulse 76   Ht 5\' 6"  (1.676 m)   Wt 153 lb 3.2 oz (69.5 kg)   SpO2 100%   BMI 24.73 kg/m   Wt Readings from Last 3 Encounters:   02/15/24 155 lb 14.4 oz (70.7 kg)  01/05/24 153 lb 3.2 oz (69.5 kg)  08/27/23 145 lb (65.8 kg)    General: Appears her stated age, well developed, well nourished in NAD. Skin: Warm, dry and intact.  HEENT: Head: normal shape and size, no sinus tenderness noted; Eyes: sclera white, no icterus, conjunctiva pink, PERRLA and EOMs intact; Throat/Mouth: Teeth present, mucosa pink and moist, + PND, no exudate, lesions or ulcerations noted.  Neck: No adenopathy noted. Cardiovascular: Normal rate and rhythm. S1,S2 noted.  No murmur, rubs or gallops noted.  Pulmonary/Chest: Normal  effort and positive vesicular breath sounds. No respiratory distress. No wheezes, rales or ronchi noted.  Neurological: Alert and oriented.   BMET    Component Value Date/Time   NA 141 01/05/2024 1010   NA 142 09/12/2020 1549   K 4.3 01/05/2024 1010   CL 108 01/05/2024 1010   CO2 25 01/05/2024 1010   GLUCOSE 81 01/05/2024 1010   BUN 19 01/05/2024 1010   BUN 8 09/12/2020 1549   CREATININE 0.90 01/05/2024 1010   CALCIUM 9.9 01/05/2024 1010   GFRNONAA 74 11/20/2020 0959   GFRAA 86 11/20/2020 0959    Lipid Panel     Component Value Date/Time   CHOL 206 (H) 01/05/2024 1010   TRIG 53 01/05/2024 1010   HDL 70 01/05/2024 1010   CHOLHDL 2.9 01/05/2024 1010   LDLCALC 122 (H) 01/05/2024 1010    CBC    Component Value Date/Time   WBC 4.2 01/05/2024 1010   RBC 4.38 01/05/2024 1010   HGB 11.9 01/05/2024 1010   HGB 13.1 09/12/2020 1549   HCT 37.5 01/05/2024 1010   HCT 39.2 09/12/2020 1549   PLT 249 01/05/2024 1010   PLT 218 09/12/2020 1549   MCV 85.6 01/05/2024 1010   MCV 89 09/12/2020 1549   MCH 27.2 01/05/2024 1010   MCHC 31.7 (L) 01/05/2024 1010   RDW 14.0 01/05/2024 1010   RDW 13.2 09/12/2020 1549   LYMPHSABS 1,080 08/08/2021 1327   EOSABS 12 (L) 08/08/2021 1327   BASOSABS 18 08/08/2021 1327    Hgb A1C Lab Results  Component Value Date   HGBA1C 5.2 01/05/2024            Assessment &  Plan:    Assessment and Plan    Allergic Rhinitis, Bronchiectasis Symptoms indicate exacerbation with increased albuterol  use and recent Symbicort  discontinuation. Uncertain current Symbicort  prescription status. - Refilled Symbicort  and albuterol  prescriptions. - Initiated oral steroid taper x 6 dats. - Prescribed azithromycin  (Z-Pak) for worsening symptoms, such as productive cough with colored mucus.       RTC in 5 months for follow-up of chronic conditions Helayne Lo, NP

## 2024-02-17 NOTE — Telephone Encounter (Signed)
  Chief Complaint: difficulty breathing,  Symptoms: cough, post nasal drip, sore throat, difficulty breathing Frequency: started with sore throat about 2 days ago, difficulty breathing started yesterday Pertinent Negatives: Patient denies fever, CP, palpitations Disposition: [] ED /[] Urgent Care (no appt availability in office) / [x] Appointment(In office/virtual)/ []  [facility]/ [] Home Care/ [] Refused Recommended Disposition /[] [place] [facility]/ []  Follow-up with PCP Additional Notes: Pt states that she has hx of bronchiectasis. Pt states that she normally is seen and given steroids with rx for abx should she get worse. Pt states that she has recently had surgery to her chest area for breast cancer. Pt states that she notes she has had to use her inhaler recently. Pt states that it could be allergies, did increase dosing of mucinex. Pt sched with PCP today.   Copied from CRM [id number]. Topic: Clinical - Red Word Triage >> Feb 17, 2024  7:49 AM Baldemar Lev wrote: Red Word that prompted transfer to Nurse Triage: Difficulty breathing, had to use inhaler several times last night. Reason for Disposition  [1] MILD difficulty breathing (e.g., minimal/no SOB at rest, SOB with walking, pulse <100) AND [2] NEW-onset or WORSE than normal  Answer Assessment - Initial Assessment Questions 1. RESPIRATORY STATUS: "Describe your breathing?" (e.g., wheezing, shortness of breath, unable to speak, severe coughing)      wheezing 2. ONSET: "When did this breathing problem begin?"      Throat started 2 days ago, breathing started yesterday 3. PATTERN "Does the difficult breathing come and go, or has it been constant since it started?"      intermittent 4. SEVERITY: "How bad is your breathing?" (e.g., mild, moderate, severe)    - MILD: No SOB at rest, mild SOB with walking, speaks normally in sentences, can lie down, no retractions, pulse < 100.    - MODERATE: SOB at rest, SOB with minimal exertion  and prefers to sit, cannot lie down flat, speaks in phrases, mild retractions, audible wheezing, pulse 100-120.    - SEVERE: Very SOB at rest, speaks in single words, struggling to breathe, sitting hunched forward, retractions, pulse > 120      Severe when supine, speaking in full sentences at this time during call, no wheezing noted 5. RECURRENT SYMPTOM: "Have you had difficulty breathing before?" If Yes, ask: "When was the last time?" and "What happened that time?"      Have had this in the past, pt states they usually start her on steroids and give her abx rx that she can fill if needed 6. CARDIAC HISTORY: "Do you have any history of heart disease?" (e.g., heart attack, angina, bypass surgery, angioplasty)      denies 7. LUNG HISTORY: "Do you have any history of lung disease?"  (e.g., pulmonary embolus, asthma, emphysema)     Bronchiectasis, asthma 8. CAUSE: "What do you think is causing the breathing problem?"      Thought may be allergies 9. OTHER SYMPTOMS: "Do you have any other symptoms? (e.g., dizziness, runny nose, cough, chest pain, fever)     Cough,  10. O2 SATURATION MONITOR:  "Do you use an oxygen saturation monitor (pulse oximeter) at home?" If Yes, ask: "What is your reading (oxygen level) today?" "What is your usual oxygen saturation reading?" (e.g., 95%)       No equip 12. TRAVEL: "Have you traveled out of the country in the last month?" (e.g., travel history, exposures)       denies  Protocols used: Breathing Difficulty-A-AH

## 2024-02-17 NOTE — Telephone Encounter (Signed)
 Will discuss at upcoming appointment

## 2024-02-18 NOTE — Telephone Encounter (Signed)
 Requested medication (s) are due for refill today: yes  Requested medication (s) are on the active medication list: yes  Last refill:  02/17/24  Future visit scheduled: no  Notes to clinic:    Pharmacy comment: Alternative Requested:NOT COVERED BY INSURANCE.       Requested Prescriptions  Pending Prescriptions Disp Refills   levalbuterol (XOPENEX HFA) 45 MCG/ACT inhaler [Pharmacy Med Name: LEVALBUTEROL TAR HFA 45MCG INH]  0     Pulmonology:  Beta Agonists 2 Failed - 02/18/2024 11:34 AM      Failed - Valid encounter within last 12 months    Recent Outpatient Visits           Yesterday Seasonal allergic rhinitis due to pollen   [facility] [place], [place]in Buzzard, NP   1 month ago Encounter for general adult medical examination with abnormal findings   [facility] [place], [place] W, NP              Passed - Last BP in normal range    BP Readings from Last 1 Encounters:  02/17/24 102/62         Passed - Last Heart Rate in normal range    Pulse Readings from Last 1 Encounters:  02/17/24 76

## 2024-02-19 ENCOUNTER — Ambulatory Visit
Admission: RE | Admit: 2024-02-19 | Discharge: 2024-02-19 | Disposition: A | Discharge status: Home / Self Care | Source: Ambulatory Visit | Attending: Certified Nurse Midwife | Admitting: Certified Nurse Midwife

## 2024-02-19 DIAGNOSIS — Z853 Personal history of malignant neoplasm of breast: Secondary | ICD-10-CM | POA: Diagnosis present

## 2024-02-19 DIAGNOSIS — Z01419 Encounter for gynecological examination (general) (routine) without abnormal findings: Secondary | ICD-10-CM | POA: Insufficient documentation

## 2024-02-19 DIAGNOSIS — Z86018 Personal history of other benign neoplasm: Secondary | ICD-10-CM | POA: Insufficient documentation

## 2024-02-19 LAB — CYTOLOGY - PAP
Comment: NEGATIVE
Diagnosis: NEGATIVE
High risk HPV: NEGATIVE

## 2024-03-08 ENCOUNTER — Encounter: Payer: Self-pay | Admitting: Certified Nurse Midwife

## 2024-03-15 ENCOUNTER — Other Ambulatory Visit: Payer: Self-pay | Admitting: Internal Medicine

## 2024-03-16 NOTE — Telephone Encounter (Signed)
 LOV 02/17/2024  Requested Prescriptions  Pending Prescriptions Disp Refills   budesonide -formoterol  (SYMBICORT ) 80-4.5 MCG/ACT inhaler [Pharmacy Med Name: BUDESONIDE -FORMOTEROL  80-4.5] 10.2 each 2    Sig: Inhale 2 puffs into the lungs daily at 12 noon.     Pulmonology:  Combination Products Failed - 03/16/2024  3:23 PM      Failed - Valid encounter within last 12 months    Recent Outpatient Visits           4 weeks ago Seasonal allergic rhinitis due to pollen   [facility] [place], Rankin Buzzard, NP   2 months ago Encounter for general adult medical examination with abnormal findings   [facility] [place], Rankin Buzzard, NP

## 2024-07-02 ENCOUNTER — Other Ambulatory Visit: Payer: Self-pay | Admitting: Medical Genetics

## 2024-07-05 ENCOUNTER — Other Ambulatory Visit
Admission: RE | Admit: 2024-07-05 | Discharge: 2024-07-05 | Disposition: A | Discharge status: Home / Self Care | Payer: Self-pay | Source: Ambulatory Visit | Attending: Medical Genetics | Admitting: Medical Genetics

## 2024-07-07 ENCOUNTER — Ambulatory Visit: Admitting: Internal Medicine

## 2024-07-07 NOTE — Progress Notes (Deleted)
 Subjective:    Patient ID: Jennifer Hubbard, female    DOB: 1961-08-11, 63 y.o.   MRN: [id number]  HPI  Patient presents to clinic today for 50-month follow-up of chronic conditions.  Anxiety and depression: Chronic, managed on sertraline  and buspirone .  She is seeing a therapist but not a psychiatrist.  She denies SI/HI.  Bronchiectasis: She reports chronic cough but denies shortness of breath.  She is taking singulair , symbicort , albuterol .  She follows with pulmonology.  There are no PFTs on file.  GERD: Triggered by laying down after eating.  She is taking baking soda OTC with good relief of symptoms.  Upper GI from 05/2021 reviewed.  Osteopenia: She is taking calcium and vit D OTC. She gets weightbearing exercise daily. Bone density from 12/2021 reviewed.  History of breast cancer:  Review of Systems     Past Medical History:  Diagnosis Date   Allergy    Anemia    Asthma    Bronchiectasis (HCC)    Cervical dystonia    Depression    IBS (irritable bowel syndrome) 09/10/2020   Sleep apnea     Current Outpatient Medications  Medication Sig Dispense Refill   albuterol  (VENTOLIN  HFA) 108 (90 Base) MCG/ACT inhaler Inhale 1 puff into the lungs every 6 (six) hours as needed for wheezing or shortness of breath. Inhale into the lungs every 6 (six) hours as needed for wheezing or shortness of breath. 18 g 3   azithromycin  (ZITHROMAX ) 250 MG tablet Take 2 tabs today, then 1 tab daily x 4 days 6 tablet 0   budesonide -formoterol  (SYMBICORT ) 80-4.5 MCG/ACT inhaler Inhale 2 puffs into the lungs daily at 12 noon. 10.2 each 2   busPIRone  (BUSPAR ) 10 MG tablet Take 1 tablet (10 mg total) by mouth 3 (three) times daily. 270 tablet 2   cycloSPORINE  (RESTASIS ) 0.05 % ophthalmic emulsion Place 1 drop into both eyes 2 (two) times daily. Administer 1 drop to both eyes Two (2) times a day. (Patient not taking: Reported on 02/17/2024) 0.4 mL 2   Ferrous Sulfate (IRON PO) Take by mouth.     fexofenadine   (ALLEGRA  ALLERGY) 180 MG tablet Take 1 tablet (180 mg total) by mouth daily. 90 tablet 1   fluticasone  (VERAMYST) 27.5 MCG/SPRAY nasal spray Place 2 sprays into the nose daily. 10 g 3   INTRAROSA  6.5 MG INST PLACE 6.5 MG VAGINALLY AT BEDTIME. 28 each 3   montelukast  (SINGULAIR ) 10 MG tablet Take 1 tablet (10 mg total) by mouth at bedtime. 90 tablet 1   Multiple Vitamin (MULTIVITAMIN) capsule Take 1 capsule by mouth daily.     Omega-3 Fatty Acids (FISH OIL) 1000 MG CAPS Take by mouth.     predniSONE  (DELTASONE ) 10 MG tablet Take 6 tabs on day 1, 5 tabs on day 2, 4 tabs on day 3, 3 tabs on day 4, 2 tabs on day 5, 1 tab on day 6 21 tablet 0   sertraline  (ZOLOFT ) 100 MG tablet TAKE 1 TABLET BY MOUTH EVERY DAY 90 tablet 1   Testosterone  1.62 % GEL Place 1 Application onto the skin 3 (three) times a week. 75 g 0   Turmeric (QC TUMERIC COMPLEX PO) Take by mouth.     VEVYE  0.1 % SOLN      No current facility-administered medications for this visit.    Allergies  Allergen Reactions   Cephalexin Other (See Comments)    Other reaction(s): Abdominal Pain Other reaction(s): Abdominal Pain  Erythromycin     nausea   Latex Other (See Comments)    Unsure if allergic   Penicillins     childhood allergy   Sulfamethoxazole-Trimethoprim Rash    Family History  Problem Relation Age of Onset   Atrial fibrillation Mother    Heart disease Mother    Lung cancer Father    Stroke Maternal Grandmother    Breast cancer Paternal Grandmother    Hypertension Paternal Grandmother    Breast cancer Maternal Aunt     Social History   Socioeconomic History   Marital status: Married    Spouse name: Not on file   Number of children: Not on file   Years of education: Not on file   Highest education level: Not on file  Occupational History   Not on file  Tobacco Use   Smoking status: Never   Smokeless tobacco: Never  Vaping Use   Vaping status: Never Used  Substance and Sexual Activity   Alcohol  use: Never   Drug use: Never   Sexual activity: Yes    Birth control/protection: None    Comment: Married  Other Topics Concern   Not on file  Social History Narrative   Not on file   Social Drivers of Health   Financial Resource Strain: Low Risk  (12/01/2023)   Received from [facility]   Overall Financial Resource Strain (CARDIA)    Difficulty of Paying Living Expenses: Not hard at all  Food Insecurity: No Food Insecurity (12/01/2023)   Received from [facility]   Hunger Vital Sign    Within the past 12 months, you worried that your food would run out before you got the money to buy more.: Never true    Within the past 12 months, the food you bought just didn't last and you didn't have money to get more.: Never true  Transportation Needs: No Transportation Needs (12/01/2023)   Received from [facility] - Transportation    In the past 12 months, has lack of transportation kept you from medical appointments or from getting medications?: No    Lack of Transportation (Non-Medical): No  Physical Activity: Not on file  Stress: Not on file  Social Connections: Not on file  Intimate Partner Violence: Not on file     Constitutional: Denies fever, malaise, fatigue, headache or abrupt weight changes.  HEENT: Denies eye pain, eye redness, ear pain, ringing in the ears, wax buildup, runny nose, nasal congestion, bloody nose, or sore throat. Respiratory: Patient reports chronic cough.  Denies difficulty breathing, shortness of breath, or sputum production.   Cardiovascular: Denies chest pain, chest tightness, palpitations or swelling in the hands or feet.  Gastrointestinal: Patient reports intermittent reflux.  Denies abdominal pain, bloating, constipation, diarrhea or blood in the stool.  GU: Denies urgency, frequency, pain with urination, burning sensation, blood in urine, odor or discharge. Musculoskeletal: Patient reports  intermittent right foot pain.  Denies decrease in range of motion, difficulty with gait, muscle pain or joint swelling.  Skin: Denies redness, rashes, lesions or ulcercations.  Neurological: Denies dizziness, difficulty with memory, difficulty with speech or problems with balance and coordination.  Psych: Patient has a history of anxiety and depression.  Denies SI/HI.  No other specific complaints in a complete review of systems (except as listed in HPI above).  Objective:   Physical Exam   There were no vitals taken for this visit.  Wt Readings from Last  3 Encounters:  02/17/24 153 lb 3.2 oz (69.5 kg)  02/15/24 155 lb 14.4 oz (70.7 kg)  01/05/24 153 lb 3.2 oz (69.5 kg)    General: Appears her stated age, well developed, well nourished in NAD. Skin: Warm, dry and intact.  HEENT: Head: normal shape and size; Eyes: sclera white, no icterus, conjunctiva pink, PERRLA and EOMs intact;  Cardiovascular: Normal rate and rhythm. S1,S2 noted.  No murmur, rubs or gallops noted. No JVD or BLE edema. No carotid bruits noted. Pulmonary/Chest: Normal effort and positive vesicular breath sounds. No respiratory distress. No wheezes, rales or ronchi noted.  Abdomen: Soft and nontender. Normal bowel sounds.  Musculoskeletal:  No difficulty with gait.  Neurological: Alert and oriented. Coordination normal.  Psychiatric: Mood and affect normal. Behavior is normal. Judgment and thought content normal.     BMET    Component Value Date/Time   NA 141 01/05/2024 1010   NA 142 09/12/2020 1549   K 4.3 01/05/2024 1010   CL 108 01/05/2024 1010   CO2 25 01/05/2024 1010   GLUCOSE 81 01/05/2024 1010   BUN 19 01/05/2024 1010   BUN 8 09/12/2020 1549   CREATININE 0.90 01/05/2024 1010   CALCIUM 9.9 01/05/2024 1010   GFRNONAA 74 11/20/2020 0959   GFRAA 86 11/20/2020 0959    Lipid Panel     Component Value Date/Time   CHOL 206 (H) 01/05/2024 1010   TRIG 53 01/05/2024 1010   HDL 70 01/05/2024 1010    CHOLHDL 2.9 01/05/2024 1010   LDLCALC 122 (H) 01/05/2024 1010    CBC    Component Value Date/Time   WBC 4.2 01/05/2024 1010   RBC 4.38 01/05/2024 1010   HGB 11.9 01/05/2024 1010   HGB 13.1 09/12/2020 1549   HCT 37.5 01/05/2024 1010   HCT 39.2 09/12/2020 1549   PLT 249 01/05/2024 1010   PLT 218 09/12/2020 1549   MCV 85.6 01/05/2024 1010   MCV 89 09/12/2020 1549   MCH 27.2 01/05/2024 1010   MCHC 31.7 (L) 01/05/2024 1010   RDW 14.0 01/05/2024 1010   RDW 13.2 09/12/2020 1549   LYMPHSABS 1,080 08/08/2021 1327   EOSABS 12 (L) 08/08/2021 1327   BASOSABS 18 08/08/2021 1327    Hgb A1C Lab Results  Component Value Date   HGBA1C 5.2 01/05/2024           Assessment & Plan:     RTC in 6 months for your annual exam Angeline Laura, NP

## 2024-07-11 ENCOUNTER — Telehealth: Payer: Self-pay

## 2024-07-11 NOTE — Telephone Encounter (Signed)
 Spoke with patient and advised that Angeline likes to do a 6 month follow up with all of her patients. Patient verbalized understanding. Scheduled for Thursday at 9am.

## 2024-07-11 NOTE — Telephone Encounter (Signed)
 Copied from CRM #[id number]. Topic: General - Other >> Jul 07, 2024  3:23 PM Mia F wrote: Reason for CRM: Pt would like a call because she does not know what she is being seen for and would like if someone could explain to her what chronic conditions she is being seen for. She missed today's appt due to another appt

## 2024-07-12 LAB — GENECONNECT MOLECULAR SCREEN: Genetic Analysis Overall Interpretation: NEGATIVE

## 2024-07-14 ENCOUNTER — Encounter: Payer: Self-pay | Admitting: Internal Medicine

## 2024-07-14 ENCOUNTER — Ambulatory Visit (INDEPENDENT_AMBULATORY_CARE_PROVIDER_SITE_OTHER): Admitting: Internal Medicine

## 2024-07-14 VITALS — BP 122/82 | Ht 66.0 in | Wt 160.6 lb

## 2024-07-14 DIAGNOSIS — J479 Bronchiectasis, uncomplicated: Secondary | ICD-10-CM

## 2024-07-14 DIAGNOSIS — K219 Gastro-esophageal reflux disease without esophagitis: Secondary | ICD-10-CM | POA: Diagnosis not present

## 2024-07-14 DIAGNOSIS — E559 Vitamin D deficiency, unspecified: Secondary | ICD-10-CM

## 2024-07-14 DIAGNOSIS — E7800 Pure hypercholesterolemia, unspecified: Secondary | ICD-10-CM | POA: Diagnosis not present

## 2024-07-14 DIAGNOSIS — F419 Anxiety disorder, unspecified: Secondary | ICD-10-CM

## 2024-07-14 DIAGNOSIS — M25561 Pain in right knee: Secondary | ICD-10-CM

## 2024-07-14 DIAGNOSIS — M25562 Pain in left knee: Secondary | ICD-10-CM

## 2024-07-14 DIAGNOSIS — Z853 Personal history of malignant neoplasm of breast: Secondary | ICD-10-CM | POA: Insufficient documentation

## 2024-07-14 DIAGNOSIS — F32A Depression, unspecified: Secondary | ICD-10-CM

## 2024-07-14 DIAGNOSIS — M25512 Pain in left shoulder: Secondary | ICD-10-CM

## 2024-07-14 DIAGNOSIS — M8589 Other specified disorders of bone density and structure, multiple sites: Secondary | ICD-10-CM

## 2024-07-14 DIAGNOSIS — G8929 Other chronic pain: Secondary | ICD-10-CM | POA: Insufficient documentation

## 2024-07-14 MED ORDER — FLUTICASONE FUROATE 27.5 MCG/SPRAY NA SUSP: 2.0000 | Freq: Every day | NASAL | 3 refills | Status: AC

## 2024-07-14 NOTE — Progress Notes (Signed)
 Subjective:    Patient ID: Jennifer Hubbard, female    DOB: 1961/01/09, 62 y.o.   MRN: [id number]  HPI  Patient presents to clinic today for 45-month follow-up of chronic conditions.  Anxiety and depression: Chronic, managed on sertraline  and buspirone .  She is no longer seeing a therapist.  She denies SI/HI.  Bronchiectasis: She reports chronic cough but denies shortness of breath.  She is taking singulair  and albuterol .  She follows with pulmonology.  There are no PFTs on file.  GERD: Triggered by laying down after eating.  She is taking baking soda OTC with good relief of symptoms.  Upper GI from 05/2021 reviewed.  Osteopenia: She is taking calcium and vit D OTC. She has not been getting much weightbearing exercise daily. Bone density from 12/2021 reviewed.  History of left breast cancer: s/p bilateral mastectomy and reconstruction. She follows with oncology.  Chronic left shoulder/knee pain: Managed with meloxicam. She has an upcoming injection planned. She follows with orthopedics.   HLD: Her last LDL was 122, triglycerides 53, 12/2023. She is taking fish oil OTC. She tries to consume a low fat diet.  Review of Systems     Past Medical History:  Diagnosis Date   Allergy    Anemia    Asthma    Bronchiectasis (HCC)    Cervical dystonia    Depression    IBS (irritable bowel syndrome) 09/10/2020   Sleep apnea     Current Outpatient Medications  Medication Sig Dispense Refill   albuterol  (VENTOLIN  HFA) 108 (90 Base) MCG/ACT inhaler Inhale 1 puff into the lungs every 6 (six) hours as needed for wheezing or shortness of breath. Inhale into the lungs every 6 (six) hours as needed for wheezing or shortness of breath. 18 g 3   azithromycin  (ZITHROMAX ) 250 MG tablet Take 2 tabs today, then 1 tab daily x 4 days 6 tablet 0   budesonide -formoterol  (SYMBICORT ) 80-4.5 MCG/ACT inhaler Inhale 2 puffs into the lungs daily at 12 noon. 10.2 each 2   busPIRone  (BUSPAR ) 10 MG tablet Take 1 tablet  (10 mg total) by mouth 3 (three) times daily. 270 tablet 2   cycloSPORINE  (RESTASIS ) 0.05 % ophthalmic emulsion Place 1 drop into both eyes 2 (two) times daily. Administer 1 drop to both eyes Two (2) times a day. (Patient not taking: Reported on 02/17/2024) 0.4 mL 2   Ferrous Sulfate (IRON PO) Take by mouth.     fexofenadine  (ALLEGRA  ALLERGY) 180 MG tablet Take 1 tablet (180 mg total) by mouth daily. 90 tablet 1   fluticasone  (VERAMYST) 27.5 MCG/SPRAY nasal spray Place 2 sprays into the nose daily. 10 g 3   INTRAROSA  6.5 MG INST PLACE 6.5 MG VAGINALLY AT BEDTIME. 28 each 3   montelukast  (SINGULAIR ) 10 MG tablet Take 1 tablet (10 mg total) by mouth at bedtime. 90 tablet 1   Multiple Vitamin (MULTIVITAMIN) capsule Take 1 capsule by mouth daily.     Omega-3 Fatty Acids (FISH OIL) 1000 MG CAPS Take by mouth.     predniSONE  (DELTASONE ) 10 MG tablet Take 6 tabs on day 1, 5 tabs on day 2, 4 tabs on day 3, 3 tabs on day 4, 2 tabs on day 5, 1 tab on day 6 21 tablet 0   sertraline  (ZOLOFT ) 100 MG tablet TAKE 1 TABLET BY MOUTH EVERY DAY 90 tablet 1   Testosterone  1.62 % GEL Place 1 Application onto the skin 3 (three) times a week. 75 g 0  Turmeric (QC TUMERIC COMPLEX PO) Take by mouth.     VEVYE  0.1 % SOLN      No current facility-administered medications for this visit.    Allergies  Allergen Reactions   Cephalexin Other (See Comments)    Other reaction(s): Abdominal Pain Other reaction(s): Abdominal Pain    Erythromycin     nausea   Latex Other (See Comments)    Unsure if allergic   Penicillins     childhood allergy   Sulfamethoxazole-Trimethoprim Rash    Family History  Problem Relation Age of Onset   Atrial fibrillation Mother    Heart disease Mother    Lung cancer Father    Stroke Maternal Grandmother    Breast cancer Paternal Grandmother    Hypertension Paternal Grandmother    Breast cancer Maternal Aunt     Social History   Socioeconomic History   Marital status: Married     Spouse name: Not on file   Number of children: Not on file   Years of education: Not on file   Highest education level: Not on file  Occupational History   Not on file  Tobacco Use   Smoking status: Never   Smokeless tobacco: Never  Vaping Use   Vaping status: Never Used  Substance and Sexual Activity   Alcohol use: Never   Drug use: Never   Sexual activity: Yes    Birth control/protection: None    Comment: Married  Other Topics Concern   Not on file  Social History Narrative   Not on file   Social Drivers of Health   Financial Resource Strain: Low Risk  (12/01/2023)   Received from [facility]   Overall Financial Resource Strain (CARDIA)    Difficulty of Paying Living Expenses: Not hard at all  Food Insecurity: No Food Insecurity (12/01/2023)   Received from [facility]   Hunger Vital Sign    Within the past 12 months, you worried that your food would run out before you got the money to buy more.: Never true    Within the past 12 months, the food you bought just didn't last and you didn't have money to get more.: Never true  Transportation Needs: No Transportation Needs (12/01/2023)   Received from [facility] - Transportation    In the past 12 months, has lack of transportation kept you from medical appointments or from getting medications?: No    Lack of Transportation (Non-Medical): No  Physical Activity: Not on file  Stress: Not on file  Social Connections: Not on file  Intimate Partner Violence: Not on file     Constitutional: Denies fever, malaise, fatigue, headache or abrupt weight changes.  HEENT: Denies eye pain, eye redness, ear pain, ringing in the ears, wax buildup, runny nose, nasal congestion, bloody nose, or sore throat. Respiratory:   Denies difficulty breathing, shortness of breath, cough or sputum production.   Cardiovascular: Denies chest pain, chest tightness, palpitations or swelling in  the hands or feet.  Gastrointestinal: Patient reports intermittent reflux.  Denies abdominal pain, bloating, constipation, diarrhea or blood in the stool.  GU: Denies urgency, frequency, pain with urination, burning sensation, blood in urine, odor or discharge. Musculoskeletal: Patient reports left shoulder and bilateral knee pain.  Denies decrease in range of motion, difficulty with gait, muscle pain or joint swelling.  Skin: Denies redness, rashes, lesions or ulcercations.  Neurological: Denies dizziness, difficulty with memory, difficulty with speech or problems  with balance and coordination.  Psych: Patient has a history of anxiety and depression.  Denies SI/HI.  No other specific complaints in a complete review of systems (except as listed in HPI above).  Objective:   Physical Exam  BP 122/82 (BP Location: Right Arm, Patient Position: Sitting, Cuff Size: Normal)   Ht 5' 6 (1.676 m)   Wt 160 lb 9.6 oz (72.8 kg)   BMI 25.92 kg/m    Wt Readings from Last 3 Encounters:  02/17/24 153 lb 3.2 oz (69.5 kg)  02/15/24 155 lb 14.4 oz (70.7 kg)  01/05/24 153 lb 3.2 oz (69.5 kg)    General: Appears her stated age, overweight, in NAD. Skin: Warm, dry and intact.  HEENT: Head: normal shape and size; Eyes: sclera white, no icterus, conjunctiva pink, PERRLA and EOMs intact;  Cardiovascular: Normal rate and rhythm. S1,S2 noted.  No murmur, rubs or gallops noted. No JVD or BLE edema. No carotid bruits noted. Pulmonary/Chest: Normal effort and positive vesicular breath sounds. No respiratory distress. No wheezes, rales or ronchi noted.  Abdomen:  Normal bowel sounds.  Musculoskeletal:  No difficulty with gait.  Neurological: Alert and oriented. Coordination normal.  Psychiatric: Mood and affect normal. Behavior is normal. Judgment and thought content normal.     BMET    Component Value Date/Time   NA 141 01/05/2024 1010   NA 142 09/12/2020 1549   K 4.3 01/05/2024 1010   CL 108  01/05/2024 1010   CO2 25 01/05/2024 1010   GLUCOSE 81 01/05/2024 1010   BUN 19 01/05/2024 1010   BUN 8 09/12/2020 1549   CREATININE 0.90 01/05/2024 1010   CALCIUM 9.9 01/05/2024 1010   GFRNONAA 74 11/20/2020 0959   GFRAA 86 11/20/2020 0959    Lipid Panel     Component Value Date/Time   CHOL 206 (H) 01/05/2024 1010   TRIG 53 01/05/2024 1010   HDL 70 01/05/2024 1010   CHOLHDL 2.9 01/05/2024 1010   LDLCALC 122 (H) 01/05/2024 1010    CBC    Component Value Date/Time   WBC 4.2 01/05/2024 1010   RBC 4.38 01/05/2024 1010   HGB 11.9 01/05/2024 1010   HGB 13.1 09/12/2020 1549   HCT 37.5 01/05/2024 1010   HCT 39.2 09/12/2020 1549   PLT 249 01/05/2024 1010   PLT 218 09/12/2020 1549   MCV 85.6 01/05/2024 1010   MCV 89 09/12/2020 1549   MCH 27.2 01/05/2024 1010   MCHC 31.7 (L) 01/05/2024 1010   RDW 14.0 01/05/2024 1010   RDW 13.2 09/12/2020 1549   LYMPHSABS 1,080 08/08/2021 1327   EOSABS 12 (L) 08/08/2021 1327   BASOSABS 18 08/08/2021 1327    Hgb A1C Lab Results  Component Value Date   HGBA1C 5.2 01/05/2024           Assessment & Plan:     RTC in 6 months for your annual exam Angeline Laura, NP

## 2024-07-14 NOTE — Assessment & Plan Note (Signed)
 Avoid eating and laying down Continue baking soda OTC

## 2024-07-14 NOTE — Assessment & Plan Note (Signed)
In remission She will continue to follow with oncology 

## 2024-07-14 NOTE — Assessment & Plan Note (Signed)
 Continue meloxicam 7.5 mg daily She will continue to follow with orthopedics.

## 2024-07-14 NOTE — Assessment & Plan Note (Signed)
 She will continue montelukast  10 mg and albuterol  108 mcg per actuation as prescribed pulmonology

## 2024-07-14 NOTE — Patient Instructions (Signed)

## 2024-07-14 NOTE — Assessment & Plan Note (Signed)
 C-Met and lipid profile today Encouraged her to consume a low-fat diet Continue fish oil 1000 mg daily

## 2024-07-14 NOTE — Assessment & Plan Note (Signed)
Continue calcium and vitamin D OTC Encouraged daily weightbearing exercise 

## 2024-07-14 NOTE — Assessment & Plan Note (Signed)
 Continue sertraline  100 mg daily and buspirone  10 mg 3 times daily She is looking for a new therapist Support offered

## 2024-07-15 ENCOUNTER — Ambulatory Visit: Payer: Self-pay | Admitting: Internal Medicine

## 2024-07-15 LAB — COMPREHENSIVE METABOLIC PANEL WITH GFR
AG Ratio: 1.9 (calc) (ref 1.0–2.5)
ALT: 18 U/L (ref 6–29)
AST: 24 U/L (ref 10–35)
Albumin: 4.3 g/dL (ref 3.6–5.1)
Alkaline phosphatase (APISO): 60 U/L (ref 37–153)
BUN: 14 mg/dL (ref 7–25)
CO2: 26 mmol/L (ref 20–32)
Calcium: 9.3 mg/dL (ref 8.6–10.4)
Chloride: 109 mmol/L (ref 98–110)
Creat: 0.87 mg/dL (ref 0.50–1.05)
Globulin: 2.3 g/dL (ref 1.9–3.7)
Glucose, Bld: 91 mg/dL (ref 65–99)
Potassium: 4 mmol/L (ref 3.5–5.3)
Sodium: 143 mmol/L (ref 135–146)
Total Bilirubin: 0.4 mg/dL (ref 0.2–1.2)
Total Protein: 6.6 g/dL (ref 6.1–8.1)
eGFR: 76 mL/min/1.73m2 (ref >=60)

## 2024-07-15 LAB — CBC
HCT: 38.5 % (ref 35.0–45.0)
Hemoglobin: 12.4 g/dL (ref 11.7–15.5)
MCH: 28.8 pg (ref 27.0–33.0)
MCHC: 32.2 g/dL (ref 32.0–36.0)
MCV: 89.5 fL (ref 80.0–100.0)
MPV: 10.8 fL (ref 7.5–12.5)
Platelets: 239 Thousand/uL (ref 140–400)
RBC: 4.3 Million/uL (ref 3.80–5.10)
RDW: 13.5 % (ref 11.0–15.0)
WBC: 3.7 Thousand/uL — ABNORMAL LOW (ref 3.8–10.8)

## 2024-07-15 LAB — LIPID PANEL
Cholesterol: 191 mg/dL (ref <200)
HDL: 65 mg/dL (ref >=50)
LDL Cholesterol (Calc): 112 mg/dL — ABNORMAL HIGH
Non-HDL Cholesterol (Calc): 126 mg/dL (ref <130)
Total CHOL/HDL Ratio: 2.9 (calc) (ref <5.0)
Triglycerides: 56 mg/dL (ref <150)

## 2024-07-15 LAB — VITAMIN D 25 HYDROXY (VIT D DEFICIENCY, FRACTURES): Vit D, 25-Hydroxy: 49 ng/mL (ref 30–100)

## 2024-08-06 ENCOUNTER — Other Ambulatory Visit: Payer: Self-pay | Admitting: Internal Medicine

## 2024-08-09 NOTE — Telephone Encounter (Signed)
 Requested Prescriptions  Pending Prescriptions Disp Refills   busPIRone  (BUSPAR ) 10 MG tablet [Pharmacy Med Name: BUSPIRONE  HCL 10 MG TABLET] 270 tablet 1    Sig: TAKE 1 TABLET BY MOUTH THREE TIMES A DAY     Psychiatry: Anxiolytics/Hypnotics - Non-controlled Passed - 08/09/2024 11:54 AM      Passed - Valid encounter within last 12 months    Recent Outpatient Visits           3 weeks ago Pure hypercholesterolemia   [facility] [place], [place] W, NP   5 months ago Seasonal allergic rhinitis due to pollen   [facility] [place], Angeline ORN, NP   7 months ago Encounter for general adult medical examination with abnormal findings   [facility] [place], Angeline ORN, NP               sertraline  (ZOLOFT ) 100 MG tablet [Pharmacy Med Name: SERTRALINE  HCL 100 MG TABLET] 90 tablet 1    Sig: TAKE 1 TABLET BY MOUTH EVERY DAY     Psychiatry:  Antidepressants - SSRI - sertraline  Passed - 08/09/2024 11:54 AM      Passed - AST in normal range and within 360 days    AST  Date Value Ref Range Status  07/14/2024 24 10 - 35 U/L Final         Passed - ALT in normal range and within 360 days    ALT  Date Value Ref Range Status  07/14/2024 18 6 - 29 U/L Final         Passed - Completed PHQ-2 or PHQ-9 in the last 360 days      Passed - Valid encounter within last 6 months    Recent Outpatient Visits           3 weeks ago Pure hypercholesterolemia   [facility] [place], [place] W, NP   5 months ago Seasonal allergic rhinitis due to pollen   [facility] [employer], Angeline ORN, NP   7 months ago Encounter for general adult medical examination with abnormal findings   [place] [facility] [place], Angeline ORN, NP

## 2024-09-27 ENCOUNTER — Encounter: Payer: Self-pay | Admitting: Internal Medicine

## 2024-09-27 ENCOUNTER — Ambulatory Visit (INDEPENDENT_AMBULATORY_CARE_PROVIDER_SITE_OTHER): Admitting: Internal Medicine

## 2024-09-27 ENCOUNTER — Ambulatory Visit
Admission: RE | Admit: 2024-09-27 | Discharge: 2024-09-27 | Disposition: A | Source: Ambulatory Visit | Attending: Internal Medicine | Admitting: Internal Medicine

## 2024-09-27 ENCOUNTER — Ambulatory Visit: Payer: Self-pay | Admitting: Internal Medicine

## 2024-09-27 VITALS — BP 124/78 | Ht 66.0 in | Wt 155.2 lb

## 2024-09-27 DIAGNOSIS — K5900 Constipation, unspecified: Secondary | ICD-10-CM | POA: Insufficient documentation

## 2024-09-27 NOTE — Patient Instructions (Signed)

## 2024-09-27 NOTE — Progress Notes (Signed)
 Subjective:    Patient ID: Jennifer Hubbard, female    DOB: 1961-07-01, 63 y.o.   MRN: [id number]  Discussed the use of AI scribe software for clinical note transcription with the patient, who gave verbal consent to proceed.  Jennifer Hubbard is a 63 year old female who presents with chronic constipation.  She has experienced constipation for over a month, with a history of intermittent constipation throughout her life. Various treatments have been tried, including metamucil, prune juice, probiotics, increased water intake, senna, stool softeners, dulcolax, miralax, and Fleet's enemas, with limited success.  She has a history of two abdominal surgeries and has undergone two colonoscopies, the most recent being three to four years ago at [facility]. Her constipation worsened after gaining weight, whereas she had no issues when she was thinner.  Her last bowel movement was the day before the visit, achieved with the aid of a suppository. No hemorrhoids, rectal pain, itching, blood in stool, nausea, or vomiting, but she reports bloating.   Review of Systems     Past Medical History:  Diagnosis Date   Allergy    Anemia    Asthma    Bronchiectasis (HCC)    Cervical dystonia    Depression    IBS (irritable bowel syndrome) 09/10/2020   Sleep apnea     Current Outpatient Medications  Medication Sig Dispense Refill   albuterol  (VENTOLIN  HFA) 108 (90 Base) MCG/ACT inhaler Inhale 1 puff into the lungs every 6 (six) hours as needed for wheezing or shortness of breath. Inhale into the lungs every 6 (six) hours as needed for wheezing or shortness of breath. 18 g 3   azithromycin  (ZITHROMAX ) 250 MG tablet Take 2 tabs today, then 1 tab daily x 4 days 6 tablet 0   budesonide -formoterol  (SYMBICORT ) 80-4.5 MCG/ACT inhaler Inhale 2 puffs into the lungs daily at 12 noon. 10.2 each 2   busPIRone  (BUSPAR ) 10 MG tablet Take 1 tablet (10 mg total) by mouth 3 (three) times daily. 270 tablet 2    cycloSPORINE  (RESTASIS ) 0.05 % ophthalmic emulsion Place 1 drop into both eyes 2 (two) times daily. Administer 1 drop to both eyes Two (2) times a day. (Patient not taking: Reported on 02/17/2024) 0.4 mL 2   Ferrous Sulfate (IRON PO) Take by mouth.     fexofenadine  (ALLEGRA  ALLERGY) 180 MG tablet Take 1 tablet (180 mg total) by mouth daily. 90 tablet 1   fluticasone  (VERAMYST) 27.5 MCG/SPRAY nasal spray Place 2 sprays into the nose daily. 10 g 3   INTRAROSA  6.5 MG INST PLACE 6.5 MG VAGINALLY AT BEDTIME. 28 each 3   montelukast  (SINGULAIR ) 10 MG tablet Take 1 tablet (10 mg total) by mouth at bedtime. 90 tablet 1   Multiple Vitamin (MULTIVITAMIN) capsule Take 1 capsule by mouth daily.     Omega-3 Fatty Acids (FISH OIL) 1000 MG CAPS Take by mouth.     predniSONE  (DELTASONE ) 10 MG tablet Take 6 tabs on day 1, 5 tabs on day 2, 4 tabs on day 3, 3 tabs on day 4, 2 tabs on day 5, 1 tab on day 6 21 tablet 0   sertraline  (ZOLOFT ) 100 MG tablet TAKE 1 TABLET BY MOUTH EVERY DAY 90 tablet 1   Testosterone  1.62 % GEL Place 1 Application onto the skin 3 (three) times a week. 75 g 0   Turmeric (QC TUMERIC COMPLEX PO) Take by mouth.     VEVYE  0.1 % SOLN  No current facility-administered medications for this visit.    Allergies  Allergen Reactions   Cephalexin Other (See Comments)    Other reaction(s): Abdominal Pain Other reaction(s): Abdominal Pain    Erythromycin     nausea   Latex Other (See Comments)    Unsure if allergic   Penicillins     childhood allergy   Sulfamethoxazole-Trimethoprim Rash    Family History  Problem Relation Age of Onset   Atrial fibrillation Mother    Heart disease Mother    Lung cancer Father    Stroke Maternal Grandmother    Breast cancer Paternal Grandmother    Hypertension Paternal Grandmother    Breast cancer Maternal Aunt     Social History   Socioeconomic History   Marital status: Married    Spouse name: Not on file   Number of children: Not on  file   Years of education: Not on file   Highest education level: Not on file  Occupational History   Not on file  Tobacco Use   Smoking status: Never   Smokeless tobacco: Never  Vaping Use   Vaping status: Never Used  Substance and Sexual Activity   Alcohol use: Never   Drug use: Never   Sexual activity: Yes    Birth control/protection: None    Comment: Married  Other Topics Concern   Not on file  Social History Narrative   Not on file   Social Drivers of Health   Financial Resource Strain: Low Risk  (12/01/2023)   Received from [facility]   Overall Financial Resource Strain (CARDIA)    Difficulty of Paying Living Expenses: Not hard at all  Food Insecurity: No Food Insecurity (12/01/2023)   Received from [facility]   Hunger Vital Sign    Within the past 12 months, you worried that your food would run out before you got the money to buy more.: Never true    Within the past 12 months, the food you bought just didn't last and you didn't have money to get more.: Never true  Transportation Needs: No Transportation Needs (12/01/2023)   Received from [facility] - Transportation    In the past 12 months, has lack of transportation kept you from medical appointments or from getting medications?: No    Lack of Transportation (Non-Medical): No  Physical Activity: Not on file  Stress: Not on file  Social Connections: Not on file  Intimate Partner Violence: Not on file     Constitutional: Denies fever, malaise, fatigue, headache or abrupt weight changes.  Respiratory:   Denies difficulty breathing, shortness of breath, cough or sputum production.   Cardiovascular: Denies chest pain, chest tightness, palpitations or swelling in the hands or feet.  Gastrointestinal: Patient reports intermittent reflux, constipation  and bloating.  Denies abdominal pain, diarrhea or blood in the stool.  Neurological: Denies dizziness,  difficulty with memory, difficulty with speech or problems with balance and coordination.   No other specific complaints in a complete review of systems (except as listed in HPI above).  Objective:   Physical Exam  BP 124/78 (BP Location: Right Arm, Patient Position: Sitting, Cuff Size: Normal)   Ht 5' 6 (1.676 m)   Wt 155 lb 3.2 oz (70.4 kg)   BMI 25.05 kg/m     Wt Readings from Last 3 Encounters:  02/17/24 153 lb 3.2 oz (69.5 kg)  02/15/24 155 lb 14.4 oz (70.7 kg)  01/05/24  153 lb 3.2 oz (69.5 kg)    General: Appears her stated age, overweight, in NAD. Cardiovascular: Normal rate and rhythm.  Pulmonary/Chest: Normal effort. No respiratory distress.  Abdomen:  Normal bowel sounds.  Musculoskeletal:  No difficulty with gait.  Neurological: Alert and oriented.    BMET    Component Value Date/Time   NA 141 01/05/2024 1010   NA 142 09/12/2020 1549   K 4.3 01/05/2024 1010   CL 108 01/05/2024 1010   CO2 25 01/05/2024 1010   GLUCOSE 81 01/05/2024 1010   BUN 19 01/05/2024 1010   BUN 8 09/12/2020 1549   CREATININE 0.90 01/05/2024 1010   CALCIUM 9.9 01/05/2024 1010   GFRNONAA 74 11/20/2020 0959   GFRAA 86 11/20/2020 0959    Lipid Panel     Component Value Date/Time   CHOL 206 (H) 01/05/2024 1010   TRIG 53 01/05/2024 1010   HDL 70 01/05/2024 1010   CHOLHDL 2.9 01/05/2024 1010   LDLCALC 122 (H) 01/05/2024 1010    CBC    Component Value Date/Time   WBC 4.2 01/05/2024 1010   RBC 4.38 01/05/2024 1010   HGB 11.9 01/05/2024 1010   HGB 13.1 09/12/2020 1549   HCT 37.5 01/05/2024 1010   HCT 39.2 09/12/2020 1549   PLT 249 01/05/2024 1010   PLT 218 09/12/2020 1549   MCV 85.6 01/05/2024 1010   MCV 89 09/12/2020 1549   MCH 27.2 01/05/2024 1010   MCHC 31.7 (L) 01/05/2024 1010   RDW 14.0 01/05/2024 1010   RDW 13.2 09/12/2020 1549   LYMPHSABS 1,080 08/08/2021 1327   EOSABS 12 (L) 08/08/2021 1327   BASOSABS 18 08/08/2021 1327    Hgb A1C Lab Results  Component  Value Date   HGBA1C 5.2 01/05/2024           Assessment & Plan:   Assessment and Plan    Chronic constipation Persisting over a month with ineffective non-prescription treatments. Differential includes SIBO and leaky gut syndrome. Recent colonoscopy 3-4 years ago.  - Ordered KUB to assess bowel obstruction. - Encouraged high-fiber diet and adequate water intake - She declines prescription medication such as Amitiza or Linzess at this time - Referral to GI for further evaluation of symptoms    RTC in 3 months for your annual exam Angeline Laura, NP

## 2024-09-30 ENCOUNTER — Other Ambulatory Visit: Payer: Self-pay | Admitting: Internal Medicine

## 2024-10-03 NOTE — Telephone Encounter (Signed)
 Requested medication (s) are due for refill today - provider review   Requested medication (s) are on the active medication list -yes  Future visit scheduled -yes  Last refill: 08/13/23 0.25ml 2RF  Notes to clinic: non delegated Rx  Requested Prescriptions  Pending Prescriptions Disp Refills   RESTASIS  0.05 % ophthalmic emulsion [Pharmacy Med Name: RESTASIS  0.05% EYE EMULSION] 180 mL     Sig: PLACE 1 DROP INTO BOTH EYES 2 (TWO) TIMES DAILY. ADMINISTER 1 DROP TO BOTH EYES TWO (2) TIMES A DAY.     Not Delegated - Immunology:  Immunosuppressive Agents - cyclosporine  (Ocular) Failed - 10/03/2024  1:35 PM      Failed - This refill cannot be delegated      Passed - Valid encounter within last 12 months    Recent Outpatient Visits           6 days ago Constipation, unspecified constipation type   [place] [facility] [address] [place], Angeline ORN, NP   2 months ago Pure hypercholesterolemia   [place] [facility] [place], [place] W, NP   7 months ago Seasonal allergic rhinitis due to pollen   [facility] [place], Angeline ORN, NP   9 months ago Encounter for general adult medical examination with abnormal findings   [facility] [place], Angeline ORN, NP                 Requested Prescriptions  Pending Prescriptions Disp Refills   RESTASIS  0.05 % ophthalmic emulsion [Pharmacy Med Name: RESTASIS  0.05% EYE EMULSION] 180 mL     Sig: PLACE 1 DROP INTO BOTH EYES 2 (TWO) TIMES DAILY. ADMINISTER 1 DROP TO BOTH EYES TWO (2) TIMES A DAY.     Not Delegated - Immunology:  Immunosuppressive Agents - cyclosporine  (Ocular) Failed - 10/03/2024  1:35 PM      Failed - This refill cannot be delegated      Passed - Valid encounter within last 12 months    Recent Outpatient Visits           6 days ago Constipation, unspecified constipation type   [place] [facility] [place], Angeline ORN, NP   2 months ago Pure  hypercholesterolemia   [place] [facility] [place], [place] W, NP   7 months ago Seasonal allergic rhinitis due to pollen   [facility] [place], Angeline ORN, NP   9 months ago Encounter for general adult medical examination with abnormal findings   [place] [facility] [place], Angeline ORN, NP

## 2024-10-05 ENCOUNTER — Encounter: Payer: Self-pay | Admitting: Internal Medicine

## 2024-10-06 MED ORDER — FOSTEUM PLUS PO CAPS: 1.0000 | ORAL_CAPSULE | Freq: Every day | ORAL | 0 refills | Status: DC

## 2024-10-10 ENCOUNTER — Ambulatory Visit
Admission: EM | Admit: 2024-10-10 | Discharge: 2024-10-10 | Disposition: A | Discharge status: Home / Self Care | Source: Home / Self Care

## 2024-10-10 DIAGNOSIS — R300 Dysuria: Secondary | ICD-10-CM | POA: Insufficient documentation

## 2024-10-10 DIAGNOSIS — N898 Other specified noninflammatory disorders of vagina: Secondary | ICD-10-CM

## 2024-10-10 DIAGNOSIS — N760 Acute vaginitis: Secondary | ICD-10-CM | POA: Diagnosis present

## 2024-10-10 LAB — POCT URINE DIPSTICK
Bilirubin, UA: NEGATIVE
Glucose, UA: NEGATIVE mg/dL
Ketones, POC UA: NEGATIVE mg/dL
Leukocytes, UA: NEGATIVE
Nitrite, UA: NEGATIVE
Protein Ur, POC: NEGATIVE mg/dL
Spec Grav, UA: 1.02 (ref 1.010–1.025)
Urobilinogen, UA: 0.2 U/dL
pH, UA: 5.5 (ref 5.0–8.0)

## 2024-10-10 MED ORDER — METRONIDAZOLE 500 MG PO TABS: 500.0000 mg | ORAL_TABLET | Freq: Two times a day (BID) | ORAL | 0 refills | Status: DC

## 2024-10-10 MED ORDER — METRONIDAZOLE 500 MG PO TABS: 500.0000 mg | ORAL_TABLET | Freq: Two times a day (BID) | ORAL | 0 refills | Status: AC

## 2024-10-10 MED ORDER — FLUCONAZOLE 150 MG PO TABS: ORAL_TABLET | ORAL | 0 refills | Status: DC

## 2024-10-10 NOTE — Discharge Instructions (Signed)

## 2024-10-10 NOTE — ED Triage Notes (Signed)
 Sx x 1 week No std testing  Dysuria  Discharge Fishy odor

## 2024-10-10 NOTE — ED Provider Notes (Signed)
 [facility]    CSN: [id number] Arrival date & time: 10/10/24  1934      History   Chief Complaint Chief Complaint  Patient presents with   Dysuria   Vaginal Discharge    HPI Jennifer Hubbard is a 63 y.o. female presenting for 1 week history of dysuria and vaginal discharge with fishy odor.  Denies fever, fatigue, abdominal pain, flank pain, hematuria.  No reported concern for STIs.  No OTC meds taken today.  Patient believes she may have a UTI or possibly BV infection.  No other concerns.  HPI  Past Medical History:  Diagnosis Date   Allergy    Anemia    Asthma    Bronchiectasis (HCC)    Cervical dystonia    Depression    IBS (irritable bowel syndrome) 09/10/2020   Sleep apnea     Patient Active Problem List   Diagnosis Date Noted   History of left breast cancer 07/14/2024   Chronic left shoulder pain 07/14/2024   Chronic knee pain 07/14/2024   Pure hypercholesterolemia 07/14/2024   Osteopenia 08/27/2023   Gastroesophageal reflux disease 11/20/2020   Anxiety and depression 09/06/2020   Bronchiectasis (HCC) 09/06/2020    Past Surgical History:  Procedure Laterality Date   BREAST CYST ASPIRATION Left    neg   ENDOSCOPIC CONCHA BULLOSA RESECTION Right 06/07/2020   Procedure: ENDOSCOPIC CONCHA BULLOSA RESECTION;  Surgeon: Edda Mt, MD;  Location: [facility];  Service: ENT;  Laterality: Right;   ESOPHAGOGASTRODUODENOSCOPY (EGD) WITH PROPOFOL  N/A 06/06/2021   Procedure: ESOPHAGOGASTRODUODENOSCOPY (EGD) WITH PROPOFOL ;  Surgeon: Janalyn Keene NOVAK, MD;  Location: [facility];  Service: Endoscopy;  Laterality: N/A;   EYE SURGERY     GASTRIC BYPASS     LAPAROSCOPIC OOPHERECTOMY     NASAL SEPTOPLASTY W/ TURBINOPLASTY Bilateral 06/07/2020   Procedure: NASAL SEPTOPLASTY WITH INFERIOR TURBINATE REDUCTION;  Surgeon: Edda Mt, MD;  Location: [facility];  Service: ENT;  Laterality: Bilateral;    OB History     Gravida  0   Para   0   Term  0   Preterm  0   AB  0   Living  0      SAB  0   IAB  0   Ectopic  0   Multiple  0   Live Births  0            Home Medications    Prior to Admission medications  Medication Sig Start Date End Date Taking? Authorizing Provider  albuterol  (VENTOLIN  HFA) 108 (90 Base) MCG/ACT inhaler Inhale 1 puff into the lungs every 6 (six) hours as needed for wheezing or shortness of breath. Inhale into the lungs every 6 (six) hours as needed for wheezing or shortness of breath. 02/17/24  Yes Antonette Angeline ORN, NP  busPIRone  (BUSPAR ) 10 MG tablet TAKE 1 TABLET BY MOUTH THREE TIMES A DAY 08/09/24  Yes Baity, Angeline ORN, NP  cycloSPORINE  (RESTASIS ) 0.05 % ophthalmic emulsion PLACE 1 DROP INTO BOTH EYES 2 (TWO) TIMES DAILY. ADMINISTER 1 DROP TO BOTH EYES TWO (2) TIMES A DAY. 10/03/24  Yes Antonette Angeline ORN, NP  Dietary Management Product (FOSTEUM PLUS) CAPS Take 1 capsule by mouth daily. 10/06/24  Yes Antonette Angeline ORN, NP  Ferrous Sulfate (IRON PO) Take by mouth.   Yes [provider]  fexofenadine  (ALLEGRA  ALLERGY) 180 MG tablet Take 1 tablet (180 mg total) by mouth daily. 03/20/22  Yes Antonette Angeline ORN, NP  fluticasone  (VERAMYST) 27.5 MCG/SPRAY nasal spray Place 2 sprays into the nose daily. 07/14/24  Yes Antonette Angeline ORN, NP  formoterol  (PERFOROMIST ) 20 MCG/2ML nebulizer solution Inhale 20 mcg into the lungs. 08/25/22  Yes [provider]  ibuprofen (ADVIL) 200 MG tablet Take 200 mg by mouth.   Yes [provider]  INTRAROSA  6.5 MG INST PLACE 6.5 MG VAGINALLY AT BEDTIME. 02/24/24  Yes Sebastian Sham, CNM  meloxicam (MOBIC) 7.5 MG tablet Take 1 tablet twice a day by oral route with meal(s). 06/30/24  Yes [provider]  montelukast  (SINGULAIR ) 10 MG tablet Take 1 tablet (10 mg total) by mouth at bedtime. 08/13/23  Yes Antonette Angeline ORN, NP  Multiple Vitamin (MULTIVITAMIN) capsule Take 1 capsule by mouth daily.   Yes [provider]  Omega-3 Fatty  Acids (FISH OIL) 1000 MG CAPS Take by mouth.   Yes [provider]  sertraline  (ZOLOFT ) 100 MG tablet TAKE 1 TABLET BY MOUTH EVERY DAY 08/09/24  Yes Baity, Angeline ORN, NP  Turmeric (QC TUMERIC COMPLEX PO) Take by mouth.   Yes [provider]  VEVYE  0.1 % SOLN  01/26/24  Yes [provider]  fluconazole  (DIFLUCAN ) 150 MG tablet Take 1 tab p.o. every 72 hours as needed yeast infection 10/10/24   Arvis Huxley B, PA-C  metroNIDAZOLE  (FLAGYL ) 500 MG tablet Take 1 tablet (500 mg total) by mouth 2 (two) times daily for 7 days. 10/10/24 10/17/24  Arvis Huxley NOVAK, PA-C    Family History Family History  Problem Relation Age of Onset   Atrial fibrillation Mother    Heart disease Mother    Lung cancer Father    Stroke Maternal Grandmother    Breast cancer Paternal Grandmother    Hypertension Paternal Grandmother    Breast cancer Maternal Aunt     Social History Social History[1]   Allergies   Walnut, Cephalexin, Erythromycin, Latex, Penicillins, and Sulfamethoxazole-trimethoprim   Review of Systems Review of Systems  Constitutional:  Negative for chills, fatigue and fever.  Gastrointestinal:  Negative for abdominal pain, diarrhea, nausea and vomiting.  Genitourinary:  Positive for dysuria and vaginal discharge. Negative for decreased urine volume, flank pain, frequency, hematuria, pelvic pain, urgency, vaginal bleeding and vaginal pain.  Musculoskeletal:  Negative for back pain.  Skin:  Negative for rash.     Physical Exam Triage Vital Signs ED Triage Vitals  Encounter Vitals Group     BP      Girls Systolic BP Percentile      Girls Diastolic BP Percentile      Boys Systolic BP Percentile      Boys Diastolic BP Percentile      Pulse      Resp      Temp      Temp src      SpO2      Weight      Height      Head Circumference      Peak Flow      Pain Score      Pain Loc      Pain Education      Exclude from Growth Chart    No data  found.  Updated Vital Signs BP (!) 142/89 (BP Location: Right Arm)   Pulse 83   Temp 98.3 F (36.8 C) (Oral)   Resp 18   Wt 155 lb (70.3 kg)   SpO2 96%   BMI 25.02 kg/m     Physical Exam Vitals and nursing  note reviewed.  Constitutional:      General: She is not in acute distress.    Appearance: Normal appearance. She is not ill-appearing or toxic-appearing.  HENT:     Head: Normocephalic and atraumatic.  Eyes:     General: No scleral icterus.       Right eye: No discharge.        Left eye: No discharge.     Conjunctiva/sclera: Conjunctivae normal.  Cardiovascular:     Rate and Rhythm: Normal rate and regular rhythm.     Heart sounds: Normal heart sounds.  Pulmonary:     Effort: Pulmonary effort is normal. No respiratory distress.     Breath sounds: Normal breath sounds.  Abdominal:     Palpations: Abdomen is soft.     Tenderness: There is no abdominal tenderness. There is no right CVA tenderness or left CVA tenderness.  Musculoskeletal:     Cervical back: Neck supple.  Skin:    General: Skin is dry.  Neurological:     General: No focal deficit present.     Mental Status: She is alert. Mental status is at baseline.     Motor: No weakness.     Gait: Gait normal.  Psychiatric:        Mood and Affect: Mood normal.        Behavior: Behavior normal.      UC Treatments / Results  Labs (all labs ordered are listed, but only abnormal results are displayed) Labs Reviewed  POCT URINE DIPSTICK - Abnormal; Notable for the following components:      Result Value   Blood, UA trace-intact (*)    All other components within normal limits  CERVICOVAGINAL ANCILLARY ONLY    EKG   Radiology No results found.  Procedures Procedures (including critical care time)  Medications Ordered in UC Medications - No data to display  Initial Impression / Assessment and Plan / UC Course  I have reviewed the triage vital signs and the nursing notes.  Pertinent labs &  imaging results that were available during my care of the patient were reviewed by me and considered in my medical decision making (see chart for details).   63 year old female presents for dysuria and vaginal discharge with fishy odor.  Believes she may have a UTI or BV infection.  She has afebrile and overall well-appearing.  No acute distress.  No abdominal or CVA tenderness.  Chest clear.  Heart regular rate and rhythm.  Urinalysis obtained today.  Not consistent with UTI.  Patient performed self swab for BV/yeast.  Will treat at this time for suspected BV with metronidazole  and amend treatment based on swab results if needed.  *** Final Clinical Impressions(s) / UC Diagnoses   Final diagnoses:  Dysuria  Acute vaginitis  Vaginal discharge     Discharge Instructions      The most common types of vaginal infections are yeast infections and bacterial vaginosis. Neither of which are really considered to be sexually transmitted. Often a pH swab or wet prep is performed and if abnormal may reveal either type of infection. Begin metronidazole  if prescribed for possible BV infection. If there is concern for yeast infection, fluconazole  is often prescribed . Take this as directed. You may also apply topical miconazole (can be purchased OTC) externally for relief of itching. Increase rest and fluid intake. If labs sent out, we will call within 2-5 days with results and amend treatment if necessary. Always try to use pH balanced  washes/wipes, urinate after intercourse, stay hydrated, and take probiotics if you are prone to vaginal infections. Return or see PCP or gynecologist for new/worsening infections.       ED Prescriptions     Medication Sig Dispense Auth. Provider   metroNIDAZOLE  (FLAGYL ) 500 MG tablet  (Status: Discontinued) Take 1 tablet (500 mg total) by mouth 2 (two) times daily for 7 days. 14 tablet Arvis Huxley B, PA-C   fluconazole  (DIFLUCAN ) 150 MG tablet  (Status:  Discontinued) Take 1 tab p.o. every 72 hours as needed yeast infection 2 tablet Arvis Huxley B, PA-C   fluconazole  (DIFLUCAN ) 150 MG tablet Take 1 tab p.o. every 72 hours as needed yeast infection 2 tablet Arvis Huxley NOVAK, PA-C   metroNIDAZOLE  (FLAGYL ) 500 MG tablet Take 1 tablet (500 mg total) by mouth 2 (two) times daily for 7 days. 14 tablet Rhyan Radler B, PA-C      PDMP not reviewed this encounter.     [1]  Social History Tobacco Use   Smoking status: Never   Smokeless tobacco: Never  Vaping Use   Vaping status: Never Used  Substance Use Topics   Alcohol use: Never   Drug use: Never

## 2024-10-11 ENCOUNTER — Telehealth: Payer: Self-pay

## 2024-10-11 ENCOUNTER — Encounter: Payer: Self-pay | Admitting: Internal Medicine

## 2024-10-11 MED ORDER — FOSTEUM PLUS PO CAPS: 1.0000 | ORAL_CAPSULE | Freq: Every day | ORAL | 0 refills | Status: AC

## 2024-10-11 NOTE — Telephone Encounter (Signed)
 Copied from CRM #[id number]. Topic: Clinical - Red Word Triage >> Oct 10, 2024 12:51 PM Victoria B wrote: Patient has gray discharge in vaginal area, odor  and burning

## 2024-10-11 NOTE — Telephone Encounter (Signed)
 Left message advising to call back to schedule an appointment to discuss further. Okay to schedule in next available if patient calls back.

## 2024-10-12 LAB — CERVICOVAGINAL ANCILLARY ONLY
Bacterial Vaginitis (gardnerella): NEGATIVE
Candida Glabrata: NEGATIVE
Candida Vaginitis: NEGATIVE
Comment: NEGATIVE
Comment: NEGATIVE
Comment: NEGATIVE

## 2024-10-25 ENCOUNTER — Ambulatory Visit: Admitting: Internal Medicine

## 2024-10-25 ENCOUNTER — Other Ambulatory Visit (HOSPITAL_COMMUNITY)
Admission: RE | Admit: 2024-10-25 | Discharge: 2024-10-25 | Disposition: A | Discharge status: Home / Self Care | Source: Ambulatory Visit | Attending: Internal Medicine | Admitting: Internal Medicine

## 2024-10-25 ENCOUNTER — Encounter: Payer: Self-pay | Admitting: Internal Medicine

## 2024-10-25 VITALS — BP 120/78 | Ht 66.0 in | Wt 156.4 lb

## 2024-10-25 DIAGNOSIS — R300 Dysuria: Secondary | ICD-10-CM | POA: Insufficient documentation

## 2024-10-25 DIAGNOSIS — R3911 Hesitancy of micturition: Secondary | ICD-10-CM

## 2024-10-25 DIAGNOSIS — R350 Frequency of micturition: Secondary | ICD-10-CM | POA: Insufficient documentation

## 2024-10-25 DIAGNOSIS — Z853 Personal history of malignant neoplasm of breast: Secondary | ICD-10-CM

## 2024-10-25 DIAGNOSIS — N9489 Other specified conditions associated with female genital organs and menstrual cycle: Secondary | ICD-10-CM | POA: Insufficient documentation

## 2024-10-25 LAB — CERVICOVAGINAL ANCILLARY ONLY
Bacterial Vaginitis (gardnerella): NEGATIVE
Candida Glabrata: NEGATIVE
Candida Vaginitis: NEGATIVE
Comment: NEGATIVE
Comment: NEGATIVE
Comment: NEGATIVE

## 2024-10-25 LAB — POCT URINE DIPSTICK
Bilirubin, UA: NEGATIVE
Blood, UA: NEGATIVE
Glucose, UA: NEGATIVE mg/dL
Ketones, POC UA: NEGATIVE mg/dL
Leukocytes, UA: NEGATIVE
Nitrite, UA: NEGATIVE
POC PROTEIN,UA: NEGATIVE
Spec Grav, UA: 1.01
Urobilinogen, UA: 0.2 U/dL
pH, UA: 6

## 2024-10-25 NOTE — Progress Notes (Signed)
 "  Subjective:    Patient ID: Jennifer Hubbard, female    DOB: 06/10/1961, 63 y.o.   MRN: [id number]  HPI  Discussed the use of AI scribe software for clinical note transcription with the patient, who gave verbal consent to proceed.  Jennifer Hubbard is a 63 year old female with a history of estrogen-positive breast cancer who presents with persistent urinary symptoms despite prior treatment for a UTI.  She has been experiencing persistent urinary symptoms for over a month, including dysuria, urinary frequency, burning with urination and pain. Initially, she was seen at urgent care on December 15.  A urinalysis showed trace hematuria, but no culture was sent.  Her wet prep was negative for BV or yeast however she was treated with oral metronidazole  and fluconazole .  On December 21st, she performed a self-administered urine test which showed large amounts of leukocytes and blood, but nitrate was negative. She did an e-visit and was prescribed nitrofurantoin for a presumed UTI, which she took for seven days. She reports improvement in symptoms but not complete resolution.  She continues to have urinary symptoms as well as vaginal burning.  She denies vaginal discharge, itching or abnormal vaginal bleeding.  In her social history, she mentions a demanding work schedule that limits her ability to take breaks, drink water, and urinate regularly, which she believes may contribute to her symptoms.       Review of Systems   Past Medical History:  Diagnosis Date   Allergy    Anemia    Asthma    Bronchiectasis (HCC)    Cervical dystonia    Depression    IBS (irritable bowel syndrome) 09/10/2020   Sleep apnea     Current Outpatient Medications  Medication Sig Dispense Refill   albuterol  (VENTOLIN  HFA) 108 (90 Base) MCG/ACT inhaler Inhale 1 puff into the lungs every 6 (six) hours as needed for wheezing or shortness of breath. Inhale into the lungs every 6 (six) hours as needed for wheezing or  shortness of breath. 18 g 3   busPIRone  (BUSPAR ) 10 MG tablet TAKE 1 TABLET BY MOUTH THREE TIMES A DAY 270 tablet 1   cycloSPORINE  (RESTASIS ) 0.05 % ophthalmic emulsion PLACE 1 DROP INTO BOTH EYES 2 (TWO) TIMES DAILY. ADMINISTER 1 DROP TO BOTH EYES TWO (2) TIMES A DAY. 180 mL 0   Dietary Management Product (FOSTEUM PLUS) CAPS Take 1 capsule by mouth daily. 90 capsule 0   Ferrous Sulfate (IRON PO) Take by mouth.     fexofenadine  (ALLEGRA  ALLERGY) 180 MG tablet Take 1 tablet (180 mg total) by mouth daily. 90 tablet 1   fluconazole  (DIFLUCAN ) 150 MG tablet Take 1 tab p.o. every 72 hours as needed yeast infection 2 tablet 0   fluticasone  (VERAMYST) 27.5 MCG/SPRAY nasal spray Place 2 sprays into the nose daily. 10 g 3   formoterol  (PERFOROMIST ) 20 MCG/2ML nebulizer solution Inhale 20 mcg into the lungs.     ibuprofen (ADVIL) 200 MG tablet Take 200 mg by mouth.     INTRAROSA  6.5 MG INST PLACE 6.5 MG VAGINALLY AT BEDTIME. 28 each 3   meloxicam (MOBIC) 7.5 MG tablet Take 1 tablet twice a day by oral route with meal(s).     montelukast  (SINGULAIR ) 10 MG tablet Take 1 tablet (10 mg total) by mouth at bedtime. 90 tablet 1   Multiple Vitamin (MULTIVITAMIN) capsule Take 1 capsule by mouth daily.     Omega-3 Fatty Acids (FISH OIL) 1000 MG CAPS Take by mouth.  sertraline  (ZOLOFT ) 100 MG tablet TAKE 1 TABLET BY MOUTH EVERY DAY 90 tablet 1   Turmeric (QC TUMERIC COMPLEX PO) Take by mouth.     VEVYE  0.1 % SOLN      No current facility-administered medications for this visit.    Allergies[1]  Family History  Problem Relation Age of Onset   Atrial fibrillation Mother    Heart disease Mother    Lung cancer Father    Stroke Maternal Grandmother    Breast cancer Paternal Grandmother    Hypertension Paternal Grandmother    Breast cancer Maternal Aunt     Social History   Socioeconomic History   Marital status: Married    Spouse name: Not on file   Number of children: Not on file   Years of  education: Not on file   Highest education level: Not on file  Occupational History   Not on file  Tobacco Use   Smoking status: Never   Smokeless tobacco: Never  Vaping Use   Vaping status: Never Used  Substance and Sexual Activity   Alcohol use: Never   Drug use: Never   Sexual activity: Yes    Birth control/protection: None    Comment: Married  Other Topics Concern   Not on file  Social History Narrative   Not on file   Social Drivers of Health   Tobacco Use: Low Risk (10/10/2024)   Patient History    Smoking Tobacco Use: Never    Smokeless Tobacco Use: Never    Passive Exposure: Not on file  Financial Resource Strain: Low Risk  (12/01/2023)   Received from [facility]   Overall Financial Resource Strain (CARDIA)    Difficulty of Paying Living Expenses: Not hard at all  Food Insecurity: No Food Insecurity (12/01/2023)   Received from [facility]   Epic    Within the past 12 months, you worried that your food would run out before you got the money to buy more.: Never true    Within the past 12 months, the food you bought just didn't last and you didn't have money to get more.: Never true  Transportation Needs: No Transportation Needs (12/01/2023)   Received from [facility] - Transportation    In the past 12 months, has lack of transportation kept you from medical appointments or from getting medications?: No    Lack of Transportation (Non-Medical): No  Physical Activity: Not on file  Stress: Not on file  Social Connections: Not on file  Intimate Partner Violence: Not on file  Depression (PHQ2-9): Low Risk (09/27/2024)   Depression (PHQ2-9)    PHQ-2 Score: 0  Alcohol Screen: Low Risk (12/05/2021)   Alcohol Screen    Last Alcohol Screening Score (AUDIT): 0  Housing: Low Risk  (11/25/2023)   Received from [facility]   Epic    In the last 12 months, was there a time when you were not able to  pay the mortgage or rent on time?: No    In the past 12 months, how many times have you moved where you were living?: 0    At any time in the past 12 months, were you homeless or living in a shelter (including now)?: No  Utilities: Not At Risk (11/25/2023)   Received from [facility]    Threatened with loss of utilities: No  Health Literacy: Low Risk (12/28/2022)  Received from [facility]   Health Literacy    : Never     Constitutional: Denies fever, malaise, fatigue, headache or abrupt weight changes.  Respiratory: Denies difficulty breathing, shortness of breath, cough or sputum production.   Cardiovascular: Denies chest pain, chest tightness, palpitations or swelling in the hands or feet.  Gastrointestinal: Denies abdominal pain, bloating, constipation, diarrhea or blood in the stool.  GU: Pt reports urinary frequency, hesitancy, dysuria, burning with urination, vaginal burning. Denies urgency, blood in urine, odor or discharge. Skin: Denies redness, rashes, lesions or ulcercations.  Neurological: Denies dizziness, difficulty with memory, difficulty with speech or problems with balance and coordination.   No other specific complaints in a complete review of systems (except as listed in HPI above).      Objective:   Physical Exam  BP 120/78 (BP Location: Left Arm, Patient Position: Sitting, Cuff Size: Normal)   Ht 5' 6 (1.676 m)   Wt 156 lb 6.4 oz (70.9 kg)   BMI 25.24 kg/m   Wt Readings from Last 3 Encounters:  10/10/24 155 lb (70.3 kg)  09/27/24 155 lb 3.2 oz (70.4 kg)  07/14/24 160 lb 9.6 oz (72.8 kg)    General: Appears her stated age, overweight, in NAD. Cardiovascular: Normal rate and rhythm.  Pulmonary/Chest: Normal effort and positive vesicular breath sounds. No respiratory distress.  Abdomen: Soft and nontender.  Pelvic: Self swab. Musculoskeletal: No difficulty with gait.  Neurological: Alert and oriented. Coordination  normal.    BMET    Component Value Date/Time   NA 143 07/14/2024 0912   NA 142 09/12/2020 1549   K 4.0 07/14/2024 0912   CL 109 07/14/2024 0912   CO2 26 07/14/2024 0912   GLUCOSE 91 07/14/2024 0912   BUN 14 07/14/2024 0912   BUN 8 09/12/2020 1549   CREATININE 0.87 07/14/2024 0912   CALCIUM 9.3 07/14/2024 0912   GFRNONAA 74 11/20/2020 0959   GFRAA 86 11/20/2020 0959    Lipid Panel     Component Value Date/Time   CHOL 191 07/14/2024 0912   TRIG 56 07/14/2024 0912   HDL 65 07/14/2024 0912   CHOLHDL 2.9 07/14/2024 0912   LDLCALC 112 (H) 07/14/2024 0912    CBC    Component Value Date/Time   WBC 3.7 (L) 07/14/2024 0912   RBC 4.30 07/14/2024 0912   HGB 12.4 07/14/2024 0912   HGB 13.1 09/12/2020 1549   HCT 38.5 07/14/2024 0912   HCT 39.2 09/12/2020 1549   PLT 239 07/14/2024 0912   PLT 218 09/12/2020 1549   MCV 89.5 07/14/2024 0912   MCV 89 09/12/2020 1549   MCH 28.8 07/14/2024 0912   MCHC 32.2 07/14/2024 0912   RDW 13.5 07/14/2024 0912   RDW 13.2 09/12/2020 1549   LYMPHSABS 1,080 08/08/2021 1327   EOSABS 12 (L) 08/08/2021 1327   BASOSABS 18 08/08/2021 1327    Hgb A1C Lab Results  Component Value Date   HGBA1C 5.2 01/05/2024            Assessment & Plan:   Assessment and Plan    Urgent care follow-up for urinary frequency, hesitancy, dysuria, burning with urination and vaginal burning Urgent care notes and labs reviewed.  Previous treatments for BV, yeast, and UTI were negative.  Repeat urinalysis negative today. - Wet prep today to check for BV or yeast, will not treat unless results come back positive - Will send urine culture for further evaluation - Discuss potential that this is postmenopausal  atrophic vaginitis which is typically treated with use of estrogen cream, however with her history of estrogen positive breast cancer, would recommend that she reach out to gynecology or oncology to discuss this. - Consider referral to urology if urinary  issues persist. - Encouraged increased water intake and regular urination every two hours.  History of estrogen receptor positive breast cancer Estrogen receptor positive breast cancer complicates treatment options for postmenopausal atrophic vaginitis. Estrogen cream requires consultation due to cancer history. - Consult with oncology or gynecology regarding the safety and appropriateness of estrogen cream.       RTC in 3 months for your annual exam Angeline Laura, NP      [1]  Allergies Allergen Reactions   Walnut Other (See Comments)    Stopped breathing (as a child) when being around walnut trees (fragrance of the trees)   walnut allergenic extract   Cephalexin Other (See Comments)    Other reaction(s): Abdominal Pain Other reaction(s): Abdominal Pain    Erythromycin     nausea   Latex Other (See Comments)    Unsure if allergic   Penicillins     childhood allergy   Sulfamethoxazole-Trimethoprim Rash   "

## 2024-10-25 NOTE — Patient Instructions (Signed)
 Vaginal Dryness and Thinning (Atrophic Vaginitis): What to Know  Atrophic vaginitis is when the lining of the vagina becomes dry, thin, and inflamed. Tell your doctor if you notice any changes in your vagina. They can help you find ways to feel better. They can also treat infections and suggest healthy habits for a healthy vagina. What are the causes? This condition is caused by a drop in estrogen. This affects the moisture in the lining of your vagina. When estrogen levels are low, the tissues become dry. This is most common when menstrual periods stop during menopause. What increases the risk? You're more likely to get this condition if: You take medicines that block estrogen. You've had your ovaries taken out. You're being treated for cancer. You recently had a baby. You're breastfeeding. You're over 27 years old. You have an eating disorder. You smoke. What are the signs or symptoms? Pain during sex. Soreness during sex. Bleeding during sex. Burning or itching around your vagina. Loss of interest in sex. Burning pain when you pee. Peeing often. Fluid coming from your vagina that isn't normal. It may be: White. [place]. Yellow. Tinted with blood. Thick. Watery. Some people don't have symptoms. How is this treated? Using a lubricant before sex. Using a moisturizer in the vagina. Using estrogen in the vagina. You may not need treatment if your symptoms are mild or you're not having sex. Follow these instructions at home: Medicines Take your medicines only as told. Do not use herbal or other medicines unless your doctor says it's safe. Use creams, lubricants, or moisturizers only as told. General instructions Talk with your doctor about any menopause symptoms and treatment options. Do not douche. Do not use scented: Sprays. Tampons. Soaps. If sex hurts, try using lubricants right before you have sex. Contact a doctor if: You have fluid coming from the vagina that's not  normal. You have a smell coming from your vagina. You have new symptoms. Your symptoms don't get better with treatment. Your symptoms get worse. This information is not intended to replace advice given to you by your health care provider. Make sure you discuss any questions you have with your health care provider. Document Revised: 05/25/2023 Document Reviewed: 05/25/2023 Elsevier Patient Education  2024 [employer].

## 2024-10-26 ENCOUNTER — Ambulatory Visit: Payer: Self-pay | Admitting: Internal Medicine

## 2024-10-26 LAB — URINE CULTURE
MICRO NUMBER:: 17410405
Result:: NO GROWTH
SPECIMEN QUALITY:: ADEQUATE

## 2024-11-01 ENCOUNTER — Encounter: Payer: Self-pay | Admitting: Obstetrics & Gynecology

## 2024-11-01 ENCOUNTER — Ambulatory Visit: Admitting: Obstetrics & Gynecology

## 2024-11-01 ENCOUNTER — Other Ambulatory Visit: Payer: Self-pay | Admitting: Obstetrics & Gynecology

## 2024-11-01 VITALS — BP 102/68 | HR 86 | Ht 66.0 in | Wt 156.0 lb

## 2024-11-01 DIAGNOSIS — N898 Other specified noninflammatory disorders of vagina: Secondary | ICD-10-CM

## 2024-11-01 DIAGNOSIS — N952 Postmenopausal atrophic vaginitis: Secondary | ICD-10-CM

## 2024-11-01 MED ORDER — ESTRING 2 MG VA RING: 1.0000 | VAGINAL_RING | VAGINAL | 4 refills | Status: DC

## 2024-11-01 NOTE — Progress Notes (Signed)
" ° ° °  GYNECOLOGY PROGRESS NOTE  Subjective:    Patient ID: Jennifer Hubbard, female    DOB: [birth year], 64 y.o.   MRN: [id number]  HPI  Patient is a 65 y.o.married P0 (2 adopted kids-14 and 66 yo kids) here today because of horrendous vaginal issues- had 2 telehealth visitsand an urgent care visit and a visit to her primary  in the last month. She was treated for a UTI by the second telehealth doc. She was treated for BV by her primary care provider. Her Aptima testing was negative twice in the last month.  She took HRT for 14 years and then developed left breast cancer 08/2023. She stopped the HRT right then.  She was last sexually active 3 months ago due to her vaginal issues. Since stopping her HRT she sometimes has postcoital spotting. She uses KY gel prn. She tried almond oil but got a UTI after that.  Her oncologist has approved vaginal estrogen for her atrophy symptoms.  She also complains of decreased libido.  The following portions of the patient's history were reviewed and updated as appropriate: allergies, current medications, past family history, past medical history, past social history, past surgical history, and problem list.  Review of Systems Pertinent items are noted in HPI.   Objective:   Blood pressure 102/68, pulse 86, height 5' 6 (1.676 m), weight 156 lb (70.8 kg). Body mass index is 25.18 kg/m. Well nourished, well hydrated White female, no apparent distress She is ambulating and conversing normally. EG- normal, paucity of pubic hair, severe atrophy Pederson speculum used. Marked atrophy Bimanual exam- normal size and shape, anteverted uterus, normal adnexal exam   Assessment:  Probably atrophic vaginitis  Plan:   Estring  sent to [facility]  "

## 2024-11-02 ENCOUNTER — Encounter: Payer: Self-pay | Admitting: Certified Nurse Midwife

## 2024-11-03 NOTE — Telephone Encounter (Signed)
 Patient called Estring  to expensive with [facility]. Pharmacy recommended Nuvaring, but that is birth control. She would like to try generic brand Imvexxy . I gave her more samples to last her until you were able to order it for her.

## 2024-11-07 ENCOUNTER — Other Ambulatory Visit: Payer: Self-pay | Admitting: Obstetrics & Gynecology

## 2024-11-07 ENCOUNTER — Encounter: Payer: Self-pay | Admitting: Certified Nurse Midwife

## 2024-11-07 MED ORDER — ESTRADIOL 10 MCG VA TABS: ORAL_TABLET | VAGINAL | 12 refills | Status: AC

## 2024-11-07 NOTE — Progress Notes (Signed)
 Generic vagifem  prescribed.

## 2024-11-30 ENCOUNTER — Encounter: Payer: Self-pay | Admitting: Certified Nurse Midwife

## 2024-12-06 ENCOUNTER — Ambulatory Visit

## 2025-01-10 ENCOUNTER — Encounter: Admitting: Internal Medicine

## 2025-01-11 ENCOUNTER — Encounter: Admitting: Internal Medicine
